# Patient Record
Sex: Male | Born: 1990 | Race: Black or African American | Hispanic: No | Marital: Single | Smoking: Current every day smoker
Health system: Southern US, Community
[De-identification: ages and names within clinical notes are randomized; demographics above are authoritative.]

## PROBLEM LIST (undated history)

## (undated) DIAGNOSIS — F329 Major depressive disorder, single episode, unspecified: Secondary | ICD-10-CM

## (undated) DIAGNOSIS — G479 Sleep disorder, unspecified: Secondary | ICD-10-CM

## (undated) DIAGNOSIS — S82899A Other fracture of unspecified lower leg, initial encounter for closed fracture: Secondary | ICD-10-CM

## (undated) DIAGNOSIS — F909 Attention-deficit hyperactivity disorder, unspecified type: Secondary | ICD-10-CM

## (undated) DIAGNOSIS — F32A Depression, unspecified: Secondary | ICD-10-CM

## (undated) DIAGNOSIS — F419 Anxiety disorder, unspecified: Secondary | ICD-10-CM

## (undated) DIAGNOSIS — F319 Bipolar disorder, unspecified: Secondary | ICD-10-CM

## (undated) HISTORY — DX: Sleep disorder, unspecified: G47.9

## (undated) HISTORY — PX: NO PAST SURGERIES: SHX2092

## (undated) HISTORY — DX: Attention-deficit hyperactivity disorder, unspecified type: F90.9

---
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
## 2010-01-17 ENCOUNTER — Emergency Department (HOSPITAL_COMMUNITY): Admission: EM | Admit: 2010-01-17 | Discharge: 2010-01-17 | Payer: Self-pay | Admitting: Emergency Medicine

## 2011-01-30 LAB — RAPID URINE DRUG SCREEN, HOSP PERFORMED
Amphetamines: NOT DETECTED
Barbiturates: NOT DETECTED
Benzodiazepines: POSITIVE — AB
Tetrahydrocannabinol: POSITIVE — AB

## 2011-01-30 LAB — BASIC METABOLIC PANEL
BUN: 12 mg/dL (ref 6–23)
Chloride: 106 mEq/L (ref 96–112)
Glucose, Bld: 94 mg/dL (ref 70–99)
Potassium: 4.1 mEq/L (ref 3.5–5.1)
Sodium: 139 mEq/L (ref 135–145)

## 2011-01-30 LAB — DIFFERENTIAL
Eosinophils Absolute: 0 10*3/uL (ref 0.0–0.7)
Eosinophils Relative: 0 % (ref 0–5)
Lymphs Abs: 1.1 10*3/uL (ref 0.7–4.0)
Monocytes Absolute: 0.6 10*3/uL (ref 0.1–1.0)

## 2011-01-30 LAB — CBC
HCT: 45 % (ref 39.0–52.0)
Hemoglobin: 14.2 g/dL (ref 13.0–17.0)
MCV: 87.2 fL (ref 78.0–100.0)
Platelets: 256 10*3/uL (ref 150–400)
WBC: 9.1 10*3/uL (ref 4.0–10.5)

## 2011-01-30 LAB — ETHANOL: Alcohol, Ethyl (B): <5 mg/dL (ref 0–10)

## 2011-01-30 LAB — TRICYCLICS SCREEN, URINE: TCA Scrn: NOT DETECTED

## 2011-12-03 ENCOUNTER — Emergency Department (INDEPENDENT_AMBULATORY_CARE_PROVIDER_SITE_OTHER)
Admission: EM | Admit: 2011-12-03 | Discharge: 2011-12-03 | Disposition: A | Discharge status: Home / Self Care | Payer: PPO | Source: Home / Self Care

## 2011-12-03 ENCOUNTER — Encounter (HOSPITAL_COMMUNITY): Payer: Self-pay | Admitting: *Deleted

## 2011-12-03 DIAGNOSIS — K047 Periapical abscess without sinus: Secondary | ICD-10-CM

## 2011-12-03 MED ORDER — HYDROCODONE-ACETAMINOPHEN 5-325 MG PO TABS: 1.0000 | ORAL_TABLET | Freq: Four times a day (QID) | ORAL | Status: AC | PRN

## 2011-12-03 MED ORDER — CLINDAMYCIN HCL 300 MG PO CAPS: 300.0000 mg | ORAL_CAPSULE | Freq: Four times a day (QID) | ORAL | Status: AC

## 2011-12-03 NOTE — ED Notes (Signed)
Co toothache to right lower molar x 1 day, rates pain at an 8, has no dentist at this time

## 2011-12-03 NOTE — ED Provider Notes (Signed)
History     CSN: [id number]  Arrival date & time 12/03/11  2440   None     Chief Complaint  Patient presents with  . Dental Pain    (Consider location/radiation/quality/duration/timing/severity/associated sxs/prior treatment) HPI Comments: Pt states has had "trouble with my tooth for a long time" but pain and swelling in gum started yesterday.   Patient is a 21 y.o. male presenting with tooth pain. The history is provided by the patient.  Dental PainPrimary symptoms do not include fever. Primary symptoms comment: abscess The symptoms began yesterday. The symptoms are worsening. The symptoms are new. The symptoms occur constantly.  Additional symptoms include: gum swelling and gum tenderness. Additional symptoms do not include: facial swelling.    Past Medical History  Diagnosis Date  . Schizophrenia     No past surgical history on file.  History reviewed. No pertinent family history.  History  Substance Use Topics  . Smoking status: Never Smoker   . Smokeless tobacco: Not on file  . Alcohol Use: No      Review of Systems  Constitutional: Negative for fever and chills.  HENT: Positive for dental problem. Negative for congestion, facial swelling and mouth sores.        Gum swelling and pain, dental pain  Hematological: Negative for adenopathy.    Allergies  Penicillins  Home Medications   Current Outpatient Rx  Name Route Sig Dispense Refill  . ARIPIPRAZOLE 20 MG PO TABS Oral Take 20 mg by mouth daily.    . ATOMOXETINE HCL 80 MG PO CAPS Oral Take 80 mg by mouth daily.    Marland Kitchen FLUOXETINE HCL 40 MG PO CAPS Oral Take 40 mg by mouth daily.      BP 132/83  Pulse 75  Temp(Src) 99.6 F (37.6 C) (Oral)  Resp 16  SpO2 98%  Physical Exam  Constitutional: He appears well-developed and well-nourished.  HENT:  Mouth/Throat: Dental abscesses present.    Pulmonary/Chest: Effort normal.  Lymphadenopathy:       Head (right side): No submandibular and no tonsillar  adenopathy present.       Head (left side): No submandibular and no tonsillar adenopathy present.    ED Course  Procedures (including critical care time)  Labs Reviewed - No data to display No results found.   No diagnosis found.    MDM  Discussed with Dr. Lorenz Coaster.        Cathlyn Parsons, NP 12/03/11 [phone]

## 2011-12-03 NOTE — ED Provider Notes (Signed)
Medical screening examination/treatment/procedure(s) were performed by non-physician practitioner and as supervising physician I was immediately available for consultation/collaboration.  Hillery Hunter, MD 12/03/11 [phone]

## 2012-02-23 ENCOUNTER — Encounter (HOSPITAL_COMMUNITY): Payer: Self-pay

## 2012-02-23 ENCOUNTER — Emergency Department (INDEPENDENT_AMBULATORY_CARE_PROVIDER_SITE_OTHER)
Admission: EM | Admit: 2012-02-23 | Discharge: 2012-02-23 | Disposition: A | Discharge status: Home Health | Payer: Self-pay | Source: Home / Self Care

## 2012-02-23 DIAGNOSIS — M542 Cervicalgia: Secondary | ICD-10-CM

## 2012-02-23 DIAGNOSIS — M25512 Pain in left shoulder: Secondary | ICD-10-CM

## 2012-02-23 DIAGNOSIS — M25519 Pain in unspecified shoulder: Secondary | ICD-10-CM

## 2012-02-23 HISTORY — DX: Depression, unspecified: F32.A

## 2012-02-23 HISTORY — DX: Major depressive disorder, single episode, unspecified: F32.9

## 2012-02-23 MED ORDER — IBUPROFEN 800 MG PO TABS: 800.0000 mg | ORAL_TABLET | Freq: Three times a day (TID) | ORAL | Status: AC

## 2012-02-23 MED ORDER — METHOCARBAMOL 750 MG PO TABS: 750.0000 mg | ORAL_TABLET | Freq: Four times a day (QID) | ORAL | Status: AC
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
## 2012-02-25 NOTE — ED Provider Notes (Signed)
Medical screening examination/treatment/procedure(s) were performed by non-physician practitioner and as supervising physician I was immediately available for consultation/collaboration.   MORENO-COLL,Lorelee Mclaurin; MD   Tomara Youngberg Moreno-Coll, MD 02/25/12 0855 

## 2012-08-06 ENCOUNTER — Encounter (HOSPITAL_COMMUNITY): Payer: Self-pay | Admitting: Emergency Medicine

## 2012-08-06 ENCOUNTER — Emergency Department (HOSPITAL_COMMUNITY)
Admission: EM | Admit: 2012-08-06 | Discharge: 2012-08-07 | Disposition: A | Discharge status: Other Acute Inpatient Hospital | Payer: BC Managed Care – PPO | Attending: Emergency Medicine | Admitting: Emergency Medicine

## 2012-08-06 DIAGNOSIS — F209 Schizophrenia, unspecified: Secondary | ICD-10-CM | POA: Insufficient documentation

## 2012-08-06 DIAGNOSIS — Z046 Encounter for general psychiatric examination, requested by authority: Secondary | ICD-10-CM | POA: Insufficient documentation

## 2012-08-06 LAB — CBC
HCT: 42.6 % (ref 39.0–52.0)
Hemoglobin: 14.1 g/dL (ref 13.0–17.0)
MCH: 27.7 pg (ref 26.0–34.0)
MCHC: 33.1 g/dL (ref 30.0–36.0)
MCV: 83.7 fL (ref 78.0–100.0)
Platelets: 245 10*3/uL (ref 150–400)
RBC: 5.09 MIL/uL (ref 4.22–5.81)
RDW: 14.4 % (ref 11.5–15.5)
WBC: 9.2 10*3/uL (ref 4.0–10.5)

## 2012-08-06 LAB — COMPREHENSIVE METABOLIC PANEL
ALT: 75 U/L — ABNORMAL HIGH (ref 0–53)
AST: 35 U/L (ref 0–37)
Albumin: 4.3 g/dL (ref 3.5–5.2)
CO2: 25 mEq/L (ref 19–32)
Chloride: 99 mEq/L (ref 96–112)
GFR calc non Af Amer: >90 mL/min (ref >90)
Potassium: 3.9 mEq/L (ref 3.5–5.1)
Sodium: 134 mEq/L — ABNORMAL LOW (ref 135–145)
Total Bilirubin: 0.3 mg/dL (ref 0.3–1.2)

## 2012-08-06 LAB — RAPID URINE DRUG SCREEN, HOSP PERFORMED
Amphetamines: NOT DETECTED
Barbiturates: NOT DETECTED
Tetrahydrocannabinol: NOT DETECTED

## 2012-08-06 MED ORDER — ARIPIPRAZOLE 10 MG PO TABS: 20.0000 mg | ORAL_TABLET | Freq: Every day | ORAL | Status: DC

## 2012-08-06 MED ORDER — METFORMIN HCL 500 MG PO TABS: 500.0000 mg | ORAL_TABLET | Freq: Every day | ORAL | Status: DC

## 2012-08-06 MED ORDER — ATOMOXETINE HCL 40 MG PO CAPS: 80.0000 mg | ORAL_CAPSULE | Freq: Every day | ORAL | Status: DC

## 2012-08-06 MED ORDER — ARIPIPRAZOLE 5 MG PO TABS: 5.0000 mg | ORAL_TABLET | Freq: Every day | ORAL | Status: DC

## 2012-08-06 MED ORDER — FLUOXETINE HCL 20 MG PO CAPS: 40.0000 mg | ORAL_CAPSULE | Freq: Every day | ORAL | Status: DC

## 2012-08-06 NOTE — ED Notes (Signed)
Tele-psych paperwork has been faxed-awaiting psychiatrist

## 2012-08-06 NOTE — ED Notes (Signed)
George [name] from [facility] in to do pt's assessment.

## 2012-08-06 NOTE — ED Notes (Signed)
1 pair of boxer shorts placed in [place] [address]-parents took all other clothing home.  Tele-psych in progress.

## 2012-08-06 NOTE — ED Notes (Signed)
Has been in depression for a couple weeks, [facility]  [facility], then two weeks ago did not take meds for three days, started having suicidal Ideations at that time-- has a plan, would take mother's pain medicine and overdose. Was dx 2 yrs ago as schizophrenic, IVC'd at that time. No hospitalizations since-- treated as outpatient first at [facility] then [facility].

## 2012-08-06 NOTE — ED Notes (Signed)
Pt states he has a history of schizophrenia.  Pt states he has been non-compliant with his meds over the last 2-3 weeks.  Pt states he lives with his parents and goes to school at [employer]-recent transfer from [employer].  Pt states he has been having suicidal ideation with plan to overdose on his Mother's narcotic medications or "find a friend who has a gun".  Pt states he has been hospitalized in the past for suicidal ideation.  Pt is alert and cooperative-given sandwich and drink with cheesestick and applesauce.  Oriented to environment.

## 2012-08-06 NOTE — ED Notes (Signed)
Telepsych completed.  

## 2012-08-06 NOTE — ED Provider Notes (Signed)
History     CSN: [id number]  Arrival date & time 08/06/12  7829   First MD Initiated Contact with Patient 08/06/12 1936      Chief Complaint  Patient presents with  . Medical Clearance    (Consider location/radiation/quality/duration/timing/severity/associated sxs/prior treatment) The history is provided by the patient.  pt w hx schizophrenia and depression, states has been feeling more depressed in past week. Denies any exacerbating or alleviating factors or specific inciting event.  Denies any recent new meds or change in meds, except to say recently non compliant w meds. Denies any attempt at self harm, but states has thoughts of suicide. No thoughts of harming others. Denies hallucinations. States physical health at baseline. Normal appetite. Some trouble sleeping at night.   Past Medical History  Diagnosis Date  . Schizophrenia   . Depression     History reviewed. No pertinent past surgical history.  History reviewed. No pertinent family history.  History  Substance Use Topics  . Smoking status: Never Smoker   . Smokeless tobacco: Not on file  . Alcohol Use: No      Review of Systems  Constitutional: Negative for fever.  HENT: Negative for neck pain.   Eyes: Negative for redness.  Respiratory: Negative for shortness of breath.   Cardiovascular: Negative for chest pain.  Gastrointestinal: Negative for abdominal pain.  Genitourinary: Negative for flank pain.  Musculoskeletal: Negative for back pain.  Skin: Negative for rash.  Neurological: Negative for headaches.  Hematological: Does not bruise/bleed easily.  Psychiatric/Behavioral: Positive for dysphoric mood.    Allergies  Penicillins  Home Medications   Current Outpatient Rx  Name Route Sig Dispense Refill  . ARIPIPRAZOLE 20 MG PO TABS Oral Take 20 mg by mouth daily.    . ATOMOXETINE HCL 80 MG PO CAPS Oral Take 80 mg by mouth daily.    Marland Kitchen FLUOXETINE HCL 40 MG PO CAPS Oral Take 40 mg by mouth daily.      Marland Kitchen METFORMIN HCL 500 MG PO TABS Oral Take 500 mg by mouth.      BP 141/86  Temp 98.8 F (37.1 C) (Oral)  Resp 20  Ht 5\' 11"  (1.803 m)  Wt 300 lb (136.079 kg)  BMI 41.84 kg/m2  SpO2 97%  Physical Exam  Nursing note and vitals reviewed. Constitutional: He is oriented to person, place, and time. He appears well-developed and well-nourished. No distress.  HENT:  Head: Atraumatic.  Eyes: Pupils are equal, round, and reactive to light.  Neck: Neck supple. No tracheal deviation present. No thyromegaly present.  Cardiovascular: Normal rate, regular rhythm, normal heart sounds and intact distal pulses.   Pulmonary/Chest: Effort normal and breath sounds normal. No accessory muscle usage. No respiratory distress.  Abdominal: Soft. He exhibits no distension. There is no tenderness.  Musculoskeletal: Normal range of motion.  Neurological: He is alert and oriented to person, place, and time.       Steady gait  Skin: Skin is warm and dry.  Psychiatric:       Depressed mood.     ED Course  Procedures (including critical care time)  Labs Reviewed  COMPREHENSIVE METABOLIC PANEL - Abnormal; Notable for the following:    Sodium 134 (*)     ALT 75 (*)     All other components within normal limits  CBC  ETHANOL  URINE RAPID DRUG SCREEN (HOSP PERFORMED)    Results for orders placed during the hospital encounter of 08/06/12  CBC  Component Value Range   WBC 9.2  4.0 - 10.5 K/uL   RBC 5.09  4.22 - 5.81 MIL/uL   Hemoglobin 14.1  13.0 - 17.0 g/dL   HCT 82.9  56.2 - 13.0 %   MCV 83.7  78.0 - 100.0 fL   MCH 27.7  26.0 - 34.0 pg   MCHC 33.1  30.0 - 36.0 g/dL   RDW 86.5  78.4 - 69.6 %   Platelets 245  150 - 400 K/uL  COMPREHENSIVE METABOLIC PANEL      Component Value Range   Sodium 134 (*) 135 - 145 mEq/L   Potassium 3.9  3.5 - 5.1 mEq/L   Chloride 99  96 - 112 mEq/L   CO2 25  19 - 32 mEq/L   Glucose, Bld 95  70 - 99 mg/dL   BUN 12  6 - 23 mg/dL   Creatinine, Ser 2.95  0.50 -  1.35 mg/dL   Calcium 9.6  8.4 - 28.4 mg/dL   Total Protein 8.0  6.0 - 8.3 g/dL   Albumin 4.3  3.5 - 5.2 g/dL   AST 35  0 - 37 U/L   ALT 75 (*) 0 - 53 U/L   Alkaline Phosphatase 80  39 - 117 U/L   Total Bilirubin 0.3  0.3 - 1.2 mg/dL   GFR calc non Af Amer >90  >90 mL/min   GFR calc Af Amer >90  >90 mL/min  ETHANOL      Component Value Range   Alcohol, Ethyl (B) <11  0 - 11 mg/dL  URINE RAPID DRUG SCREEN (HOSP PERFORMED)      Component Value Range   Opiates NONE DETECTED  NONE DETECTED   Cocaine NONE DETECTED  NONE DETECTED   Benzodiazepines NONE DETECTED  NONE DETECTED   Amphetamines NONE DETECTED  NONE DETECTED   Tetrahydrocannabinol NONE DETECTED  NONE DETECTED   Barbiturates NONE DETECTED  NONE DETECTED       MDM  Labs.  [facility] team called.   telepsych consult.   Signed out to oncoming edp to f/u with telepsych and act recommendations/placement.        Suzi Roots, MD 08/06/12 2202

## 2012-08-07 MED ORDER — FLUOXETINE HCL 20 MG PO CAPS
40.0000 mg | ORAL_CAPSULE | Freq: Every day | ORAL | Status: DC
  Administered 2012-08-07: 40 mg via ORAL
  Filled 2012-08-07 (×2): qty 2

## 2012-08-07 MED ORDER — ARIPIPRAZOLE 5 MG PO TABS
5.0000 mg | ORAL_TABLET | Freq: Every day | ORAL | Status: DC
  Administered 2012-08-07: 5 mg via ORAL
  Filled 2012-08-07 (×2): qty 1

## 2012-08-07 NOTE — ED Notes (Signed)
Father present to see this pt

## 2012-08-07 NOTE — BH Assessment (Signed)
Assessment Note   George [name] is a21 y.o. male who presents with SI w/plan to overdose on mom's medication.  Pt reports non-compliance with meds---"I don't like taking my meds b/c I don't like how they make me feel and sometimes I just forget". Pt reports no specific precip event to trigger SI and plan but teel this writer he had his 1st psychotic break approx 2 yrs ago while a student at [employer] and was admitted to [facility] and has had intermittent SI thoughts since that time.  Pt says when he had the psychotic break he had grandiose delusions that "I was better than everybody else".  Pt says he has been of his meds for 3 days; prescribed abilify, prozac and straterra.  Pt describes his depression: over sleeping and eating--"there's no end to what's going on".  Pt is anxious and restless during interview but cooperative, able to answer questions and remain focus.  Pt has completed telepsych recommend inpt admission.  Axis I: Chronic Paranoid Schizophrenia Axis II: Deferred Axis III:  Past Medical History  Diagnosis Date  . Schizophrenia   . Depression    Axis IV: other psychosocial or environmental problems and problems related to social environment Axis V: 31-40 impairment in reality testing  Past Medical History:  Past Medical History  Diagnosis Date  . Schizophrenia   . Depression     History reviewed. No pertinent past surgical history.  Family History: History reviewed. No pertinent family history.  Social History:  reports that he has never smoked. He does not have any smokeless tobacco history on file. He reports that he does not drink alcohol or use illicit drugs.  Additional Social History:  Alcohol / Drug Use Pain Medications: None  Prescriptions: None  Over the Counter: eNone  History of alcohol / drug use?: No history of alcohol / drug abuse Longest period of sobriety (when/how long): None   CIWA: CIWA-Ar BP: 134/84 mmHg Pulse Rate: 75    COWS:    Allergies:  Allergies  Allergen Reactions  . Penicillins Rash    Home Medications:  (Not in a hospital admission)  OB/GYN Status:  No LMP for male patient.  General Assessment Data Location of Assessment: [facility] Living Arrangements: Parent (Lives with parents ) Can pt return to current living arrangement?: Yes Admission Status: Voluntary Is patient capable of signing voluntary admission?: Yes Transfer from: Acute Hospital Referral Source: MD  Education Status Is patient currently in school?: No Current Grade: None  Highest grade of school patient has completed: None  Name of school: None  Contact person: None   Risk to self Suicidal Ideation: Yes-Currently Present Suicidal Intent: Yes-Currently Present Is patient at risk for suicide?: Yes Suicidal Plan?: Yes-Currently Present Specify Current Suicidal Plan: Overdose on mother's meds(diazepam)  Access to Means: Yes Specify Access to Suicidal Means: Mom's medications  What has been your use of drugs/alcohol within the last 12 months?: Past hx of Cannabis Abuse  Previous Attempts/Gestures: No How many times?: 0  Other Self Harm Risks: None  Triggers for Past Attempts: None known Intentional Self Injurious Behavior: None Family Suicide History: No Recent stressful life event(s): Other (Comment) (Non compliant with meds; Chronic Mental Illness ) Persecutory voices/beliefs?: No Depression: Yes Depression Symptoms: Loss of interest in usual pleasures;Feeling worthless/self pity Substance abuse history and/or treatment for substance abuse?: No Suicide prevention information given to non-admitted patients: Not applicable  Risk to Others Homicidal Ideation: No Thoughts of Harm to Others:  No Current Homicidal Intent: No Current Homicidal Plan: No Access to Homicidal Means: No Identified Victim: None  History of harm to others?: No Assessment of Violence: None Noted Violent Behavior Description: None  Does  patient have access to weapons?: No Criminal Charges Pending?: No Does patient have a court date: No  Psychosis Hallucinations: None noted Delusions: None noted  Mental Status Report Appear/Hygiene: Other (Comment) (Appropriate ) Eye Contact: Good Motor Activity: Unremarkable Speech: Logical/coherent Level of Consciousness: Quiet/awake Mood: Depressed;Anxious;Helpless Affect: Anxious;Depressed Anxiety Level: Minimal Thought Processes: Coherent;Relevant Judgement: Impaired Orientation: Person;Place;Time;Situation Obsessive Compulsive Thoughts/Behaviors: None  Cognitive Functioning Concentration: Decreased Memory: Recent Intact;Remote Intact IQ: Average Insight: Poor Impulse Control: Poor Appetite: Good Weight Loss: 0  Weight Gain: 0  Sleep: Increased Total Hours of Sleep: 8  Vegetative Symptoms: Staying in bed  ADLScreening [facility] Assessment Services) Patient's cognitive ability adequate to safely complete daily activities?: Yes Patient able to express need for assistance with ADLs?: Yes Independently performs ADLs?: Yes (appropriate for developmental age)  Abuse/Neglect [facility]) Physical Abuse: Denies Verbal Abuse: Denies Sexual Abuse: Denies  Prior Inpatient Therapy Prior Inpatient Therapy: Yes Prior Therapy Dates: 2011 Prior Therapy Facilty/Provider(s): [facility]  Reason for Treatment: Psychosis   Prior Outpatient Therapy Prior Outpatient Therapy: Yes Prior Therapy Dates: Current  Prior Therapy Facilty/Provider(s): Dr. Farrel Demark; [facility]  Reason for Treatment: Med Mgt; Therapy   ADL Screening (condition at time of admission) Patient's cognitive ability adequate to safely complete daily activities?: Yes Patient able to express need for assistance with ADLs?: Yes Independently performs ADLs?: Yes (appropriate for developmental age) Weakness of Legs: None Weakness of Arms/Hands: None  Home Assistive Devices/Equipment Home Assistive  Devices/Equipment: None  Therapy Consults (therapy consults require a physician order) PT Evaluation Needed: No OT Evalulation Needed: No SLP Evaluation Needed: No Abuse/Neglect Assessment (Assessment to be complete while patient is alone) Physical Abuse: Denies Verbal Abuse: Denies Sexual Abuse: Denies Exploitation of patient/patient's resources: Denies Self-Neglect: Denies Values / Beliefs Cultural Requests During Hospitalization: None Spiritual Requests During Hospitalization: None Consults Spiritual Care Consult Needed: No Social Work Consult Needed: No Merchant navy officer (For Healthcare) Advance Directive: Patient does not have advance directive;Patient would not like information Pre-existing out of facility DNR order (yellow form or pink MOST form): No Nutrition Screen- [facility] Patient's home diet: Carb modified Have you recently lost weight without trying?: No Have you been eating poorly because of a decreased appetite?: No Malnutrition Screening Tool Score: 0   Additional Information 1:1 In Past 12 Months?: No CIRT Risk: No Elopement Risk: No Does patient have medical clearance?: Yes     Disposition:  Disposition Disposition of Patient: Inpatient treatment program;Referred to [facility] ) Type of inpatient treatment program: Adult Patient referred to: Other (Comment) [facility] )  On Site Evaluation by:   Reviewed with Physician:     Murrell Redden 08/07/2012 12:28 AM

## 2012-08-07 NOTE — BHH Counselor (Signed)
Patient accepted at [facility] pending a bed assignment. No available beds at [facility] this am. Patient was then referred to [facility] this morning for placement. Later received a call from [facility] stating that their physician (Dr. Hardie Pulley) accepted patient into their facility for treatment. The call report number is [phone] and patient is to report to their Media Building. Writer provided the above information to the EDP here at [facility] (Dr. Freida Busman) and he agreed to discharge patient for treatment at [facility]. Writer also provided patient's nurse with the above information and she will follow up with calling report and transportation to [facility].  [facility] contacted and updated with the above information also.

## 2012-08-07 NOTE — ED Provider Notes (Signed)
Pt accepted at [facility], awaiting transfer  Toy Baker, MD 08/07/12 [phone]

## 2012-08-07 NOTE — [facility] Counselor (Signed)
George [name], assessment counselor at [facility], submitted Pt for admission to [facility]. Marquita Palms McMurren, AC confirmed the adult unit is currently at capacity. Gave clinical reports to Dr. Orson Aloe who accepted Pt to the service of Dr. Cyndia Diver when a 400-hall bed is available. Notified George [name] of disposition.  Harlin Rain Patsy Baltimore, LPC

## 2014-04-30 ENCOUNTER — Ambulatory Visit (INDEPENDENT_AMBULATORY_CARE_PROVIDER_SITE_OTHER): Payer: BC Managed Care – PPO | Admitting: Neurology

## 2014-04-30 ENCOUNTER — Encounter: Payer: Self-pay | Admitting: Neurology

## 2014-04-30 VITALS — BP 116/74 | HR 64 | Temp 98.4°F | Ht 71.0 in | Wt 325.0 lb

## 2014-04-30 DIAGNOSIS — F200 Paranoid schizophrenia: Secondary | ICD-10-CM

## 2014-04-30 DIAGNOSIS — G4733 Obstructive sleep apnea (adult) (pediatric): Secondary | ICD-10-CM

## 2014-04-30 DIAGNOSIS — E669 Obesity, unspecified: Secondary | ICD-10-CM

## 2014-04-30 DIAGNOSIS — G479 Sleep disorder, unspecified: Secondary | ICD-10-CM

## 2014-04-30 HISTORY — DX: Sleep disorder, unspecified: G47.9

## 2014-04-30 NOTE — Progress Notes (Signed)
Subjective:    Patient ID: George [name] is a 23 y.o. male.  HPI    George Foley, MD, PhD [facility] [address] [address] [place], [place] [address]  Dear George [name],   I saw your patient, George [name], upon your kind request in my neurologic clinic today for initial consultation of his sleep disorder, in particular, concern for underlying sleep apnea. The patient is accompanied by his mother today. As you know, George [name] is a 23 year old right-handed gentleman with an underlying medical history of obesity, schizophrenia, who reports snoring and daytime somnolence.  He has witnessed apneas. He does not wake up with headaches. He does not wake up rested. He drinks 2 cups of coffee and some tea and no sodas (rare). He is on Abilify 30 mg qHS, Wellbutrin 150 mg, Zoloft 200 mg, cogentin 1 mg bid. He has no recent VH/AH/SI/HI, his last behavioral admission was 2013 at [facility].  He is in college.  His typical bedtime is reported to be around 9 PM and usual wake time is around 7 AM. Sleep onset typically occurs within minutes. He reports feeling marginally rested upon awakening. He wakes up on an average 1 times in the middle of the night and has to go to the bathroom 0 to 1 time on a typical night. He denies morning headaches.  He reports excessive daytime somnolence (EDS) and His Epworth Sleepiness Score (ESS) is 14/24 today. He has not fallen asleep while driving. The patient has been taking a planned nap, which is usually 2 hours long. He has come close to dozing off while at the wheel.  He has been known to snore for the past many years. Snoring is reportedly severe, and associated with choking sounds and witnessed apneas. The patient denies a sense of choking or strangling feeling. There is report of nighttime reflux, with no nighttime cough experienced. The patient has not noted any RLS symptoms and is not known to kick while asleep or before falling  asleep. He denies cataplexy, sleep paralysis, hypnagogic or hypnopompic hallucinations, or sleep attacks. He does not report any vivid dreams, nightmares, dream enactments, or parasomnias, such as sleep talking or sleep walking. The patient has not had a sleep study or a home sleep test.   His bedroom is usually dark and cool. There is no TV in the bedroom and usually it is not on at night.   His Past Medical History Is Significant For: Past Medical History  Diagnosis Date  . Schizophrenia   . Depression   . ADHD (attention deficit hyperactivity disorder)     His Past Surgical History Is Significant For: No past surgical history on file.  His Family History Is Significant For: Family History  Problem Relation Age of Onset  . Diabetes Maternal Grandmother   . Alzheimer's disease Paternal Grandmother   . Heart failure Paternal Grandmother   . Heart failure Paternal Grandfather   . Heart failure Maternal Grandfather     His Social History Is Significant For: History   Social History  . Marital Status: Single    Spouse Name: N/A    Number of Children: N/A  . Years of Education: N/A   Social History Main Topics  . Smoking status: Never Smoker   . Smokeless tobacco: None  . Alcohol Use: No  . Drug Use: No  . Sexual Activity:    Other Topics Concern  . None   Social History Narrative  . None  His Allergies Are:  Allergies  Allergen Reactions  . Penicillins Rash  :   His Current Medications Are:  Outpatient Encounter Prescriptions as of 04/30/2014  Medication Sig  . ARIPiprazole (ABILIFY) 20 MG tablet Take 30 mg by mouth daily.   . benztropine (COGENTIN) 1 MG tablet Take 1 mg by mouth 2 (two) times daily.  Marland Kitchen buPROPion (WELLBUTRIN XL) 150 MG 24 hr tablet Take 150 mg by mouth daily.  . sertraline (ZOLOFT) 100 MG tablet Take 200 mg by mouth daily.  . [DISCONTINUED] atomoxetine (STRATTERA) 80 MG capsule Take 80 mg by mouth daily.  . [DISCONTINUED] FLUoxetine (PROZAC)  40 MG capsule Take 40 mg by mouth daily.  . [DISCONTINUED] metFORMIN (GLUCOPHAGE) 500 MG tablet Take 500 mg by mouth.  :  Review of Systems:  Out of a complete 14 point review of systems, all are reviewed and negative with the exception of these symptoms as listed below:   Review of Systems  Constitutional: Positive for activity change and fatigue.  HENT: Negative.   Eyes: Negative.   Respiratory: Negative.   Cardiovascular: Negative.   Gastrointestinal: Negative.   Endocrine: Negative.   Genitourinary: Negative.   Musculoskeletal: Negative.   Skin: Positive for rash.  Allergic/Immunologic: Positive for environmental allergies.  Neurological: Negative.   Hematological: Negative.   Psychiatric/Behavioral: Positive for sleep disturbance (e.d.s., snoring) and dysphoric mood. The patient is nervous/anxious.     Objective:  Neurologic Exam  Physical Exam Physical Examination:   Filed Vitals:   04/30/14 0854  BP: 116/74  Pulse: 64  Temp: 98.4 F (36.9 C)    General Examination: The patient is a very pleasant 23 y.o. male in no acute distress. He appears well-developed and well-nourished and well groomed.   HEENT: Normocephalic, atraumatic, pupils are equal, round and reactive to light and accommodation. Funduscopic exam is normal with sharp disc margins noted. Extraocular tracking is good without limitation to gaze excursion or nystagmus noted. Normal smooth pursuit is noted. Hearing is grossly intact. Tympanic membranes are clear bilaterally. Face is symmetric with normal facial animation and normal facial sensation. Speech is clear with no dysarthria noted. There is no hypophonia. There is no lip, neck/head, jaw or voice tremor. Neck is supple with full range of passive and active motion. There are no carotid bruits on auscultation. Oropharynx exam reveals: mild mouth dryness, adequate dental hygiene and moderate airway crowding, due to longer tongue, longer uvula and tonsils in  place. Mallampati is class II. Tongue protrudes centrally and palate elevates symmetrically. Tonsils are 1+ in size/absent. Neck size is 19.75 inches. He has a Absent overbite. Nasal inspection reveals no significant nasal mucosal bogginess or redness and no septal deviation.   Chest: Clear to auscultation without wheezing, rhonchi or crackles noted.  Heart: S1+S2+0, regular and normal without murmurs, rubs or gallops noted.   Abdomen: Soft, non-tender and non-distended with normal bowel sounds appreciated on auscultation.  Extremities: There is no pitting edema in the distal lower extremities bilaterally. Pedal pulses are intact.  Skin: Warm and dry without trophic changes noted. There are no varicose veins.  Musculoskeletal: exam reveals no obvious joint deformities, tenderness or joint swelling or erythema.   Neurologically:  Mental status: The patient is awake, alert and oriented in all 4 spheres. His immediate and remote memory, attention, language skills and fund of knowledge are appropriate. There is no evidence of aphasia, agnosia, apraxia or anomia. Speech is clear with normal prosody and enunciation. Thought process is linear. Mood is  normal and affect is normal.  Cranial nerves II - XII are as described above under HEENT exam. In addition: shoulder shrug is normal with equal shoulder height noted. Motor exam: Normal bulk, strength and tone is noted. There is no drift, tremor or rebound. Romberg is negative. Reflexes are 2+ throughout. Babinski: Toes are flexor bilaterally. Fine motor skills and coordination: intact with normal finger taps, normal hand movements, normal rapid alternating patting, normal foot taps and normal foot agility.  Cerebellar testing: No dysmetria or intention tremor on finger to nose testing. Heel to shin is unremarkable bilaterally. There is no truncal or gait ataxia.  Sensory exam: intact to light touch, pinprick, vibration, temperature sense in the upper and  lower extremities.  Gait, station and balance: He stands easily. No veering to one side is noted. No leaning to one side is noted. Posture is age-appropriate and stance is narrow based. Gait shows normal stride length and normal pace. No problems turning are noted. He turns en bloc. Tandem walk is unremarkable. Intact toe and heel stance is noted.               Assessment and Plan:   In summary, Hulda MarinWilliam A Sipe is a very pleasant 23 y.o.-year old male with an underlying medical history of obesity, schizophrenia, who reports snoring, witnessed apneas and daytime somnolence, with a history and physical exam concerning for obstructive sleep apnea (OSA). I had a long chat with the patient and his mother about my findings and the diagnosis of OSA, its prognosis and treatment options. We talked about medical treatments, surgical interventions and non-pharmacological approaches. I explained in particular the risks and ramifications of untreated moderate to severe OSA, especially with respect to developing cardiovascular disease down the Road, including congestive heart failure, difficult to treat hypertension, cardiac arrhythmias, or stroke. Even type 2 diabetes has, in part, been linked to untreated OSA. Symptoms of untreated OSA include daytime sleepiness, memory problems, mood irritability and mood disorder such as depression and anxiety, lack of energy, as well as recurrent headaches, especially morning headaches. We talked about trying to maintain a healthy lifestyle in general, as well as the importance of weight control. He has been able to lose several pounds. I encouraged the patient to eat healthy, exercise daily and keep well hydrated, to keep a scheduled bedtime and wake time routine, to not skip any meals and eat healthy snacks in between meals. I advised the patient not to drive when feeling sleepy. He is advised to pull over and rest when he starts feeling drowsy while at the wheel. He demonstrated  understanding and voiced agreement. I recommended the following at this time: sleep study with potential positive airway pressure titration.  I explained the sleep test procedure to the patient and also outlined possible surgical and non-surgical treatment options of OSA, including the use of a custom-made dental device (which would require a referral to a specialist dentist or oral surgeon), upper airway surgical options, such as pillar implants, radiofrequency surgery, tongue base surgery, and UPPP (which would involve a referral to an ENT surgeon). Rarely, jaw surgery such as mandibular advancement may be considered.  I also explained the CPAP treatment option to the patient, who indicated that he would be willing to try CPAP if the need arises. I explained the importance of being compliant with PAP treatment, not only for insurance purposes but primarily to improve His symptoms, and for the patient's long term health benefit, including to reduce His cardiovascular risks.  I answered all their questions today and the patient and his mother were in agreement. I would like to see him back after the sleep study is completed and encouraged them to call with any interim questions, concerns, problems or updates.   Thank you very much for allowing me to participate in the care of this nice patient. If I can be of any further assistance to you please do not hesitate to call me at [phone].  Sincerely,   George FoleySaima Athar, MD, PhD

## 2014-04-30 NOTE — Patient Instructions (Signed)

## 2014-06-11 ENCOUNTER — Ambulatory Visit (INDEPENDENT_AMBULATORY_CARE_PROVIDER_SITE_OTHER): Payer: BC Managed Care – PPO | Admitting: Neurology

## 2014-06-11 ENCOUNTER — Encounter: Payer: Self-pay | Admitting: Neurology

## 2014-06-11 DIAGNOSIS — E669 Obesity, unspecified: Secondary | ICD-10-CM

## 2014-06-11 DIAGNOSIS — G4733 Obstructive sleep apnea (adult) (pediatric): Secondary | ICD-10-CM

## 2014-06-11 DIAGNOSIS — G4710 Hypersomnia, unspecified: Secondary | ICD-10-CM

## 2014-06-11 DIAGNOSIS — F200 Paranoid schizophrenia: Secondary | ICD-10-CM

## 2014-06-26 ENCOUNTER — Telehealth: Payer: Self-pay | Admitting: Neurology

## 2014-06-26 DIAGNOSIS — G4733 Obstructive sleep apnea (adult) (pediatric): Secondary | ICD-10-CM

## 2014-06-26 NOTE — Telephone Encounter (Signed)
Please call and notify patient that the recent sleep study confirmed the diagnosis of OSA. He did well with CPAP during the study with significant improvement of the respiratory events. Therefore, I would like start the patient on CPAP at home. I placed the order in the chart.   Arrange for CPAP set up at home through a DME company of patient's choice and fax/route report to PCP and referring MD (if other than PCP).   The patient will also need a follow up appointment with me in 6-8 weeks post set up that has to be scheduled; help the patient schedule this (in a follow-up slot).   Please re-enforce the importance of compliance with treatment and the need for us to monitor compliance data.   Once you have spoken to the patient and scheduled the return appointment, you may close this encounter, thanks,   Patra Gherardi, MD, PhD [facility] ([facility]    

## 2014-07-15 NOTE — Telephone Encounter (Signed)
Patient calls to the office to review his sleep study results.  He was advised that his sleep study revealed severe obstructive sleep apnea with treatment recommended in the form of CPAP therapy.  Pt says that currently his deductible is too high for him to proceed with treatment.  He says that he will call our office if he is able to get financial assistance from his parents.

## 2015-12-07 ENCOUNTER — Encounter (HOSPITAL_COMMUNITY): Payer: Self-pay | Admitting: *Deleted

## 2015-12-07 ENCOUNTER — Emergency Department (HOSPITAL_COMMUNITY)
Admission: EM | Admit: 2015-12-07 | Discharge: 2015-12-08 | Disposition: A | Discharge status: Other Acute Inpatient Hospital | Payer: Managed Care, Other (non HMO) | Attending: Emergency Medicine | Admitting: Emergency Medicine

## 2015-12-07 DIAGNOSIS — F419 Anxiety disorder, unspecified: Secondary | ICD-10-CM | POA: Insufficient documentation

## 2015-12-07 DIAGNOSIS — Z880 Allergy status to penicillin: Secondary | ICD-10-CM | POA: Diagnosis not present

## 2015-12-07 DIAGNOSIS — F22 Delusional disorders: Secondary | ICD-10-CM | POA: Insufficient documentation

## 2015-12-07 DIAGNOSIS — F259 Schizoaffective disorder, unspecified: Secondary | ICD-10-CM | POA: Insufficient documentation

## 2015-12-07 DIAGNOSIS — F909 Attention-deficit hyperactivity disorder, unspecified type: Secondary | ICD-10-CM | POA: Insufficient documentation

## 2015-12-07 DIAGNOSIS — Z008 Encounter for other general examination: Secondary | ICD-10-CM | POA: Diagnosis present

## 2015-12-07 DIAGNOSIS — F251 Schizoaffective disorder, depressive type: Secondary | ICD-10-CM | POA: Diagnosis present

## 2015-12-07 DIAGNOSIS — Z79899 Other long term (current) drug therapy: Secondary | ICD-10-CM | POA: Insufficient documentation

## 2015-12-07 DIAGNOSIS — F329 Major depressive disorder, single episode, unspecified: Secondary | ICD-10-CM | POA: Diagnosis not present

## 2015-12-07 MED ORDER — ZIPRASIDONE MESYLATE 20 MG IM SOLR
10.0000 mg | Freq: Once | INTRAMUSCULAR | Status: AC
  Administered 2015-12-07: 10 mg via INTRAMUSCULAR
  Filled 2015-12-07: qty 20

## 2015-12-07 MED ORDER — OLANZAPINE 10 MG PO TBDP
10.0000 mg | ORAL_TABLET | Freq: Every day | ORAL | Status: DC
  Administered 2015-12-08: 10 mg via ORAL
  Filled 2015-12-07: qty 1

## 2015-12-07 MED ORDER — LORAZEPAM 1 MG PO TABS
1.0000 mg | ORAL_TABLET | Freq: Once | ORAL | Status: DC
  Filled 2015-12-07: qty 1

## 2015-12-07 MED ORDER — LORAZEPAM 2 MG/ML IJ SOLN
2.0000 mg | Freq: Once | INTRAMUSCULAR | Status: AC
  Administered 2015-12-07: 2 mg via INTRAMUSCULAR
  Filled 2015-12-07: qty 1

## 2015-12-07 NOTE — ED Notes (Signed)
Pt was still not cooperating- was speaking incoherently and was refusing to change or take medication. Geodon and Ativan given with help from security and [employer].

## 2015-12-07 NOTE — ED Notes (Signed)
Delay on blood draw,  Pt not cooperating

## 2015-12-07 NOTE — ED Notes (Addendum)
Pt's parents reports pt's manager called them reporting pt have been acting "strange" and not himself.   They report that pt have stopped taking his meds for a year.  This nurse asked pt why his mgr called his parents today while he was at work.  Pt states that he does not know why, but started to recount what happened.  He said "I told amy that my father has cancer,"  "She really made me feel better" but he reports that "Gwen started to say .."  Pt would stop mid-sentence everytime he tried to tell this  Nurse what happened at work today.  Pt's mother reports that pt has been in the ED in the past for having a "psych break down."  She states that she does not feel safe with pt in their home.  Pt made aware that he will need to provide a urine sample and blood sample.  Pt questioned why he needs to provide both.  Explained to pt the reason for this.  Pt appears manic and on edge.  Would cut this nurse off when speaking to him.  Mom reports that she found a bottle of caffeine tabs in his room today.  Asked pt why is he taking caffeine pills, pt states " I did have coffee today" and would not answer why?

## 2015-12-07 NOTE — ED Notes (Signed)
UNABLE TO COLLECT LABS BECAUSE OF PATIENT BEHAVIOR 

## 2015-12-07 NOTE — BH Assessment (Addendum)
Tele Assessment Note   George [name] is an 25 y.o. African-American male who was brought voluntarily to the [facility] by his parents after they received a call from his employer today.  Per parents, pt's manager called to tell them that pt "was not himself" and "was acting strange."  Per pt, he is "confused" about the events at work today but stated that he told a friend, Amy, at work that his father has cancer and pt sts talking to her was comforting.  After that, pt sts others came to him asking questions and he ended up in his manager's office.  Per parents, pt has been previously diagnosed with schizophrenia, paranoid type. Per mom, pt has been prescribed medications for his mental health diagnoses but, has not taken them for a bout 1 year. Pt was being seen at [facility] for medication management and therapy. Per parents, pt has had "a psych breakdown" previously about 7 years ago and was hospitalized.  Pt was hospitalized again about 4 years ago due to altered mental status.  Pt denies SI, HI, SHI and AVH although pt paused about 5 seconds before answering each time and appeared to be listening or thinking in the pause. Pt has also been previously diagnosed with ADHD.  Pt sts his main stressor is conflict with a Production designer, theatre/television/film at work, [employer]. Pt remained focused on Gwen throughout the assessment.  When asked if he had been having or had ever had thoughts of harming her he stated no. Pt denied symptoms of depression or anxiety, although pt appeared anxious throughout. Pt denied abuse including physical, emotional.verbal and sexual although, with each question, pt paused for about 5 seconds before stating "not really."   Pt sts he lives with his parents and is taking courses through [employer].  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  Pt currently is employed at [employer], a used Product/process development scientist.  Pt denies regular substance use currently but sts that he at one time about  4 years ago smoked marijuana daily to help ease the stress of his grandnmother who was dying of Alzheimer's. Pt sts that now he only occasionally drinks alcohol and smokes marijuana and denies using any other substances.  Pt has not given blood or urine samples yet tonight for testing in the ED. Mom sts that she found a bottle of caffeine pills in his room. Pt sts he has no current charges pending against him or upcoming court dates but, did mention past charges.  When asked about the previous charges, pt became agitated and did not answer. Pt sts he eats well and has Obstructive Sleep Apnea which negatively affects his ability to sleep well.   Pt was dressed in a white tee shirt/street clothes and sitting on his hospital bed. Pt was alert, mostly uncooperative and irritable/unpleasant. Pt appeared paranoid, argumentative, suspicious and angry most of the time. Pt kept good eye contact almost staring at times and his eyes appeared glassy. Pt spoke in a loud voice and at a rapid, pressured pace. Pt moved in a erratic manner when moving. Pt's thought process was at times coherent and relevant but just as often, pt was lost in a flight of ideas or focused on his co-worker, Gwen.  Pt's judgement was clearly impaired.  Pt's mood was anxious and angry and his flat or angry affect was congruent.  Pt was oriented x 2, to person and place.   Diagnosis: 295.90 Schizophrenia,  paranoid type, by hx;   Past Medical History:  Past Medical History  Diagnosis Date  . Schizophrenia (HCC)   . Depression   . ADHD (attention deficit hyperactivity disorder)   . 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 No pertinent past surgical history.  Family History:  Family History  Problem Relation Age of Onset  . Diabetes Maternal Grandmother   . Alzheimer's disease Paternal Grandmother   . Heart failure Paternal Grandmother   . Heart failure Paternal Grandfather   . Heart failure Maternal Grandfather     Social  History:  reports that he has never smoked. He does not have any smokeless tobacco history on file. He reports that he does not drink alcohol or use illicit drugs.  Additional Social History:  Alcohol / Drug Use Prescriptions: See PTA list History of alcohol / drug use?: Yes Substance #1 Name of Substance 1: Marijuana 1 - Age of First Use: 10th grade 1 - Frequency: daily 1 - Duration: sts he has cut down but does not know frequency 1 - Last Use / Amount: "it's been a while" about 4 yrs ago Substance #2 Name of Substance 2: Alcohol 2 - Amount (size/oz): 1 drink 2 - Last Use / Amount: Christmas party  CIWA: CIWA-Ar BP: 143/85 mmHg Pulse Rate: 102 COWS:    PATIENT STRENGTHS: (choose at least two) Average or above average intelligence Communication skills Supportive family/friends  Allergies:  Allergies  Allergen Reactions  . Penicillins Rash    Home Medications:  (Not in a hospital admission)  OB/GYN Status:  No LMP for male patient.  General Assessment Data Location of Assessment: [facility] TTS Assessment: In system Is this a Tele or Face-to-Face Assessment?: Tele Assessment Is this an Initial Assessment or a Re-assessment for this encounter?: Initial Assessment Marital status: Single Maiden name: na Is patient pregnant?: No Pregnancy Status: No Living Arrangements: Parent (live with parents) Can pt return to current living arrangement?: Yes Admission Status: Voluntary Is patient capable of signing voluntary admission?: Yes Referral Source: Self/Family/Friend Insurance type: [facility]  Medical Screening Exam [facility] Walk-in ONLY) Medical Exam completed: Yes  Crisis Care Plan Living Arrangements: Parent (live with parents) [facility]rist[facility] of [facility]  Education Status Is patient currently in school?: Yes Highest grade of school patient has completed: 12 Name of school: [employer] person: na  Risk to self with the past  6 months Suicidal Ideation: No (denies) Has patient been a risk to self within the past 6 months prior to admission? : Yes Suicidal Intent: No (denies) Has patient had any suicidal intent within the past 6 months prior to admission? : No (denies) Is patient at risk for suicide?: No Suicidal Plan?: No (denies) Has patient had any suicidal plan within the past 6 months prior to admission? : No (denies) Access to Means: No (denies) What has been your use of drugs/alcohol within the last 12 months?: occasional Previous Attempts/Gestures: No (denies) How many times?: 0 Other Self Harm Risks: no Triggers for Past Attempts:  (na) Intentional Self Injurious Behavior: None Family Suicide History: Unknown (great aunt w schizophrenia) Recent stressful life event(s):  (conflict w a co-worker; Dad has cancer) Persecutory voices/beliefs?: Yes Depression: Yes Depression Symptoms:  (denies symptoms of depression) Substance abuse history and/or treatment for substance abuse?: Yes Suicide prevention information given to non-admitted patients: Not applicable  Risk to Others within the past 6 months Homicidal Ideation: No (denies) Does patient have any lifetime risk  of violence toward others beyond the six months prior to admission? : No (denies) Thoughts of Harm to Others: No (denies) Current Homicidal Intent: No (denies) Current Homicidal Plan: No (denies) Access to Homicidal Means: No (denies) Identified Victim: angry at manager at work, Naval architect (denies thoughts of harming her) History of harm to others?: No (denies) Assessment of Violence: None Noted Violent Behavior Description: none noted Does patient have access to weapons?: No (denies) Criminal Charges Pending?: No (denies) Does patient have a court date: No (denies) Is patient on probation?: No (denies)  Psychosis Hallucinations: None noted (denies) Delusions: Unspecified, Persecutory (paranoia)  Mental Status Report Appearance/Hygiene:  Disheveled, Unremarkable Eye Contact: Good (glassy eyes) Motor Activity: Freedom of movement, Unremarkable Speech: Pressured, Rapid, Aggressive Level of Consciousness: Alert, Irritable, Restless Mood: Anxious, Irritable, Suspicious Affect: Angry, Anxious, Irritable, Flat Anxiety Level: Moderate Judgement: Impaired Orientation: Person, Place Obsessive Compulsive Thoughts/Behaviors: None  Cognitive Functioning Concentration: Fair Memory: Recent Impaired, Remote Impaired IQ: Average Insight: Poor Impulse Control: Poor Appetite: Good Weight Loss: 0 Weight Gain: 0 Sleep: Decreased Total Hours of Sleep:  (poor sleep) Vegetative Symptoms: None  ADLScreening [facility] Assessment Services) Patient's cognitive ability adequate to safely complete daily activities?:  (altered mental status-uncertain) Patient able to express need for assistance with ADLs?:  (uncertain) Independently performs ADLs?: Yes (appropriate for developmental age)  Prior Inpatient Therapy Prior Inpatient Therapy: Yes Prior Therapy Dates: since approximately 2010 Prior Therapy Facilty/Provider(s): [facility] Reason for Treatment: Schizophrenia, paranoid type  Prior Outpatient Therapy Prior Outpatient Therapy: Yes Prior Therapy Dates: up until 1 year ago Prior Therapy Facilty/Provider(s): [facility] Reason for Treatment: Schizophrenia Does patient have an ACCT team?: No Does patient have Intensive In-House Services?  : No Does patient have [facility] services? : No Does patient have P4CC services?: No  ADL Screening (condition at time of admission) Patient's cognitive ability adequate to safely complete daily activities?:  (altered mental status-uncertain) Patient able to express need for assistance with ADLs?:  (uncertain) Independently performs ADLs?: Yes (appropriate for developmental age)       Abuse/Neglect Assessment (Assessment to be complete while patient is alone) Physical Abuse:  Denies Verbal Abuse: Denies Sexual Abuse: Denies Exploitation of patient/patient's resources: Denies Self-Neglect: Denies     Merchant navy officer (For Healthcare) Does patient have an advance directive?: No Would patient like information on creating an advanced directive?: No - patient declined information    Additional Information 1:1 In Past 12 Months?: No CIRT Risk: No Elopement Risk: No Does patient have medical clearance?: Yes     Disposition:  Disposition Initial Assessment Completed for this Encounter: Yes Disposition of Patient: Other dispositions (Pending review w [facility] Extednder) Other disposition(s): Other (Comment)  Per [name], PA: Pt Meet IP criteria. Recommend IP tx.  Per Clint Bolder, [facility] for [facility]: Accepted to [facility].  Room TBD    Beryle Flock, MS, [facility], [facility] Triage Specialist [facility] T 12/07/2015 8:13 PM

## 2015-12-07 NOTE — ED Provider Notes (Signed)
CSN: [id number]     Arrival date & time 12/07/15  1754 History   First MD Initiated Contact with Patient 12/07/15 1824     Chief Complaint  Patient presents with  . Medical Clearance     (Consider location/radiation/quality/duration/timing/severity/associated sxs/prior Treatment) Patient is a 25 y.o. male presenting with general illness. The history is provided by the patient.  Illness Severity:  Mild Onset quality:  Sudden Duration:  2 days Timing:  Constant Chronicity:  Chronic Associated symptoms: no abdominal pain, no chest pain, no congestion, no diarrhea, no fever, no headaches, no myalgias, no rash, no shortness of breath and no vomiting    25 yo M with a chief complaint of acting bizarrely. Patient has a history of schizophrenia and has not taken his medications in the past year. Patient has been acting increasingly aggressive at home and has made the family feel that he is not safe to be there. Patient denies any current complaints. Denies any hallucinations. Denies suicidal or homicidal ideation.   Past Medical History  Diagnosis Date  . Schizophrenia (HCC)   . Depression   . ADHD (attention deficit hyperactivity disorder)   . 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 No pertinent past surgical history. Family History  Problem Relation Age of Onset  . Diabetes Maternal Grandmother   . Alzheimer's disease Paternal Grandmother   . Heart failure Paternal Grandmother   . Heart failure Paternal Grandfather   . Heart failure Maternal Grandfather    Social History  Substance Use Topics  . Smoking status: Never Smoker   . Smokeless tobacco: None  . Alcohol Use: No    Review of Systems  Constitutional: Negative for fever and chills.  HENT: Negative for congestion and facial swelling.   Eyes: Negative for discharge and visual disturbance.  Respiratory: Negative for shortness of breath.   Cardiovascular: Negative for chest pain and palpitations.  Gastrointestinal:  Negative for vomiting, abdominal pain and diarrhea.  Musculoskeletal: Negative for myalgias and arthralgias.  Skin: Negative for color change and rash.  Neurological: Negative for tremors, syncope and headaches.  Psychiatric/Behavioral: Negative for confusion and dysphoric mood. The patient is nervous/anxious.       Allergies  Penicillins  Home Medications   Prior to Admission medications   Medication Sig Start Date End Date Taking? Authorizing Provider  ARIPiprazole (ABILIFY) 20 MG tablet Take 30 mg by mouth daily.  11/30/09   Historical Provider, MD  benztropine (COGENTIN) 1 MG tablet Take 1 mg by mouth 2 (two) times daily. 04/30/13   Historical Provider, MD  buPROPion (WELLBUTRIN XL) 150 MG 24 hr tablet Take 150 mg by mouth daily. 07/31/12   Historical Provider, MD  CAFFEINE PO Take by mouth.    Historical Provider, MD  sertraline (ZOLOFT) 100 MG tablet Take 200 mg by mouth daily. 07/31/12   Historical Provider, MD   BP 143/85 mmHg  Pulse 102  Temp(Src) 98.8 F (37.1 C) (Oral)  Resp 16  SpO2 100% Physical Exam  Constitutional: He is oriented to person, place, and time. He appears well-developed and well-nourished.  HENT:  Head: Normocephalic and atraumatic.  Eyes: EOM are normal. Pupils are equal, round, and reactive to light.  Neck: Normal range of motion. Neck supple. No JVD present.  Cardiovascular: Normal rate and regular rhythm.  Exam reveals no gallop and no friction rub.   No murmur heard. Pulmonary/Chest: No respiratory distress. He has no wheezes.  Abdominal: He exhibits no distension. There is no rebound and no  guarding.  Musculoskeletal: Normal range of motion.  Neurological: He is alert and oriented to person, place, and time.  Skin: No rash noted. No pallor.  Psychiatric: He has a normal mood and affect. His behavior is normal. Thought content is paranoid. He expresses impulsivity and inappropriate judgment. He expresses no homicidal and no suicidal ideation. He  expresses no suicidal plans and no homicidal plans.  Nursing note and vitals reviewed.   ED Course  Procedures (including critical care time) Labs Review Labs Reviewed  COMPREHENSIVE METABOLIC PANEL  ETHANOL  CBC  URINE RAPID DRUG SCREEN, HOSP PERFORMED    Imaging Review No results found. I have personally reviewed and evaluated these images and lab results as part of my medical decision-making.   EKG Interpretation None      MDM   Final diagnoses:  Schizoaffective disorder, unspecified type [facility])    25 yo M with a chief complaint of having a schizophrenic breakdown. TTS will evaluate. Psych to admit.   The patients results and plan were reviewed and discussed.   Any x-rays performed were independently reviewed by myself.   Differential diagnosis were considered with the presenting HPI.  Medications  OLANZapine zydis (ZYPREXA) disintegrating tablet 10 mg (not administered)  LORazepam (ATIVAN) tablet 1 mg (not administered)  ziprasidone (GEODON) injection 10 mg (not administered)  LORazepam (ATIVAN) injection 2 mg (not administered)    Filed Vitals:   12/07/15 1833  BP: 143/85  Pulse: 102  Temp: 98.8 F (37.1 C)  TempSrc: Oral  Resp: 16  SpO2: 100%    Final diagnoses:  Schizoaffective disorder, unspecified type (HCC)    Admission/ observation were discussed with the admitting physician, patient and/or family and they are comfortable with the plan.      Melene Plan, DO 12/07/15 2257

## 2015-12-08 ENCOUNTER — Inpatient Hospital Stay (HOSPITAL_COMMUNITY)
Admission: AD | Admit: 2015-12-08 | Discharge: 2015-12-13 | DRG: 885 | Disposition: A | Discharge status: Home / Self Care | Payer: Federal, State, Local not specified - Other | Attending: Psychiatry | Admitting: Psychiatry

## 2015-12-08 ENCOUNTER — Encounter (HOSPITAL_COMMUNITY): Payer: Self-pay

## 2015-12-08 DIAGNOSIS — F251 Schizoaffective disorder, depressive type: Secondary | ICD-10-CM | POA: Diagnosis present

## 2015-12-08 DIAGNOSIS — F200 Paranoid schizophrenia: Secondary | ICD-10-CM | POA: Diagnosis not present

## 2015-12-08 DIAGNOSIS — Z9114 Patient's other noncompliance with medication regimen: Secondary | ICD-10-CM

## 2015-12-08 DIAGNOSIS — F259 Schizoaffective disorder, unspecified: Secondary | ICD-10-CM | POA: Diagnosis not present

## 2015-12-08 DIAGNOSIS — F209 Schizophrenia, unspecified: Secondary | ICD-10-CM | POA: Diagnosis present

## 2015-12-08 LAB — COMPREHENSIVE METABOLIC PANEL
ALBUMIN: 4.7 g/dL (ref 3.5–5.0)
ALT: 28 U/L (ref 17–63)
AST: 25 U/L (ref 15–41)
Alkaline Phosphatase: 50 U/L (ref 38–126)
Anion gap: 10 (ref 5–15)
BUN: 15 mg/dL (ref 6–20)
CHLORIDE: 104 mmol/L (ref 101–111)
CO2: 25 mmol/L (ref 22–32)
CREATININE: 1.16 mg/dL (ref 0.61–1.24)
Calcium: 10 mg/dL (ref 8.9–10.3)
GFR calc Af Amer: >60 mL/min (ref >60)
GLUCOSE: 135 mg/dL — AB (ref 65–99)
POTASSIUM: 3.7 mmol/L (ref 3.5–5.1)
SODIUM: 139 mmol/L (ref 135–145)
Total Bilirubin: 1 mg/dL (ref 0.3–1.2)
Total Protein: 8 g/dL (ref 6.5–8.1)

## 2015-12-08 LAB — CBC
HEMATOCRIT: 44.3 % (ref 39.0–52.0)
HEMOGLOBIN: 14.2 g/dL (ref 13.0–17.0)
MCH: 27.5 pg (ref 26.0–34.0)
MCHC: 32.1 g/dL (ref 30.0–36.0)
MCV: 85.7 fL (ref 78.0–100.0)
Platelets: 235 10*3/uL (ref 150–400)
RBC: 5.17 MIL/uL (ref 4.22–5.81)
RDW: 14.7 % (ref 11.5–15.5)
WBC: 12.6 10*3/uL — ABNORMAL HIGH (ref 4.0–10.5)

## 2015-12-08 LAB — RAPID URINE DRUG SCREEN, HOSP PERFORMED
Amphetamines: NOT DETECTED
BARBITURATES: NOT DETECTED
Benzodiazepines: NOT DETECTED
COCAINE: NOT DETECTED
Opiates: NOT DETECTED
TETRAHYDROCANNABINOL: NOT DETECTED

## 2015-12-08 LAB — ETHANOL

## 2015-12-08 MED ORDER — HYDROXYZINE HCL 25 MG PO TABS: 25.0000 mg | ORAL_TABLET | Freq: Three times a day (TID) | ORAL | Status: DC | PRN

## 2015-12-08 MED ORDER — ARIPIPRAZOLE 10 MG PO TABS
10.0000 mg | ORAL_TABLET | Freq: Two times a day (BID) | ORAL | Status: DC
  Filled 2015-12-08 (×2): qty 1

## 2015-12-08 MED ORDER — OLANZAPINE 10 MG PO TBDP: 10.0000 mg | ORAL_TABLET | Freq: Two times a day (BID) | ORAL | Status: DC | PRN

## 2015-12-08 MED ORDER — TRAZODONE HCL 100 MG PO TABS
100.0000 mg | ORAL_TABLET | Freq: Every day | ORAL | Status: DC
Start: 2015-12-08 — End: 2015-12-08

## 2015-12-08 MED ORDER — OLANZAPINE 5 MG PO TBDP
5.0000 mg | ORAL_TABLET | Freq: Four times a day (QID) | ORAL | Status: DC | PRN
  Administered 2015-12-09 – 2015-12-10 (×3): 5 mg via ORAL
  Filled 2015-12-08 (×3): qty 1

## 2015-12-08 MED ORDER — TRAZODONE HCL 50 MG PO TABS
50.0000 mg | ORAL_TABLET | Freq: Every evening | ORAL | Status: DC | PRN
  Administered 2015-12-08 – 2015-12-12 (×6): 50 mg via ORAL
  Filled 2015-12-08 (×3): qty 1
  Filled 2015-12-08 (×2): qty 14
  Filled 2015-12-08 (×10): qty 1

## 2015-12-08 MED ORDER — ARIPIPRAZOLE 15 MG PO TABS: 30.0000 mg | ORAL_TABLET | Freq: Every day | ORAL | Status: DC

## 2015-12-08 MED ORDER — BENZTROPINE MESYLATE 1 MG PO TABS
1.0000 mg | ORAL_TABLET | Freq: Two times a day (BID) | ORAL | Status: DC
  Administered 2015-12-08: 1 mg via ORAL
  Filled 2015-12-08: qty 1

## 2015-12-08 MED ORDER — SERTRALINE HCL 50 MG PO TABS
100.0000 mg | ORAL_TABLET | Freq: Every day | ORAL | Status: DC
  Administered 2015-12-08: 100 mg via ORAL
  Filled 2015-12-08: qty 2

## 2015-12-08 MED ORDER — SERTRALINE HCL 50 MG PO TABS: 200.0000 mg | ORAL_TABLET | Freq: Every day | ORAL | Status: DC

## 2015-12-08 MED ORDER — OLANZAPINE 10 MG PO TBDP
10.0000 mg | ORAL_TABLET | Freq: Four times a day (QID) | ORAL | Status: DC | PRN
  Administered 2015-12-08: 10 mg via ORAL
  Filled 2015-12-08: qty 1

## 2015-12-08 NOTE — H&P (Signed)
Psychiatric Admission Assessment Adult  Patient Identification: George [name] MRN:  [id number] Date of Evaluation:  12/09/2015 Chief Complaint:  SCHIZOPHRENIA PARANID TYPE Principal Diagnosis: Schizophrenia (HCC) Diagnosis:   Patient Active Problem List   Diagnosis Date Noted  . Schizophrenia (HCC) [F20.9] 12/08/2015    Priority: High   History of Present Illness: George [name], 25 y.o. male was initially brought in voluntarily to the [facility] by his parents.  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  Today, patient today visibly got anxious as he recollected the events that brought him into hospital.  He stated his main trigger was tense work environment created by Science writer.  Patient  remained focused on Gwen throughout the assessment.  He denies HI towards her.  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  In his late teens, he was told he was Schizophrenic.  Patient could not remember the meds he had been on and he was vague when he was asked if he was compliant.    Patient stated that he told workers that he father was dealing with cancer and that this gave him a lot of stress.  He states that he lives with his parents and that he is upset that his parents were called by his work about his behavior .  Collateral information from mom includes that pt has been prescribed medications for his mental health diagnoses but, has not taken them for a bout 1 year.  Pt was being seen at [facility] for medication management and therapy. Per parents, pt has had "a psych breakdown" previously about 4 years ago and then 3 years later after that and was hospitalized.   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  He denies recreational substance use with the  exception of THC "before."  Associated Signs/Symptoms: Depression Symptoms:  difficulty concentrating, anxiety, (Hypo) Manic Symptoms:  Irritable Mood, Labiality of Mood, Anxiety Symptoms:  Excessive Worry, Psychotic Symptoms:  NA PTSD Symptoms: NA Total Time spent with patient: 45 minutes  Past Psychiatric History:   Schizoaffective disorder, depressive type (HCC) [F25.1]  Schizophrenia (HCC) [F20.9]   Risk to Self: Is patient at risk for suicide?: No What has been your use of drugs/alcohol within the last 12 months?: occasional use of alcohol, admits to smoking cannabis regularly in college Risk to Others:   Prior Inpatient Therapy:   Prior Outpatient Therapy:    Alcohol Screening: Patient refused Alcohol Screening Tool: Yes (Unable to get patient to answer or discuss anything beyond his disjointed account of work and how he came to be in the hosp and the paperwork Nurse is asking him to sign) 1. How often do you have a drink containing alcohol?: Never 9. Have you or someone else been injured as a result of your drinking?: No 10. Has a relative or friend or a doctor or another health worker been concerned about your drinking or suggested you cut down?: No Alcohol Use Disorder Identification Test Final Score (AUDIT): 0 Brief Intervention: Patient declined brief intervention Substance Abuse History in the last 12 months:  No. Denies Consequences of Substance Abuse: NA Previous Psychotropic Medications: Yes, Abilify Psychological Evaluations: Yes  Past Medical History:  Past Medical History  Diagnosis Date  .  Schizophrenia (HCC)   . Depression   . ADHD (attention deficit hyperactivity disorder)   . 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 No pertinent past surgical history. Family History:  Family History  Problem Relation Age of Onset  . Diabetes Maternal Grandmother   . Alzheimer's disease Paternal Grandmother   . Heart failure Paternal Grandmother   . Heart  failure Paternal Grandfather   . Heart failure Maternal Grandfather   . Cancer Father    Family Psychiatric  History:  Denies Tobacco Screening: @FLOW ([id number])::1)@ Social History:  History  Alcohol Use No     History  Drug Use No    Additional Social History:  Allergies:   Allergies  Allergen Reactions  . Penicillins Rash   Lab Results:  Results for orders placed or performed during the hospital encounter of 12/07/15 (from the past 48 hour(s))  Urine rapid drug screen (hosp performed) (Not at [facility])     Status: None   Collection Time: 12/08/15  1:09 AM  Result Value Ref Range   Opiates NONE DETECTED NONE DETECTED   Cocaine NONE DETECTED NONE DETECTED   Benzodiazepines NONE DETECTED NONE DETECTED   Amphetamines NONE DETECTED NONE DETECTED   Tetrahydrocannabinol NONE DETECTED NONE DETECTED   Barbiturates NONE DETECTED NONE DETECTED    Comment:        DRUG SCREEN FOR MEDICAL PURPOSES ONLY.  IF CONFIRMATION IS NEEDED FOR ANY PURPOSE, NOTIFY LAB WITHIN 5 DAYS.        LOWEST DETECTABLE LIMITS FOR URINE DRUG SCREEN Drug Class       Cutoff (ng/mL) Amphetamine      1000 Barbiturate      200 Benzodiazepine   200 Tricyclics       300 Opiates          300 Cocaine          300 THC              50     Metabolic Disorder Labs:  No results found for: HGBA1C, MPG No results found for: PROLACTIN No results found for: CHOL, TRIG, HDL, CHOLHDL, VLDL, LDLCALC  Current Medications: Current Facility-Administered Medications  Medication Dose Route Frequency Provider Last Rate Last Dose  . acetaminophen (TYLENOL) tablet 650 mg  650 mg Oral Q6H PRN Jomarie Longs, MD      . hydrOXYzine (ATARAX/VISTARIL) tablet 25 mg  25 mg Oral TID PRN Jomarie Longs, MD      . OLANZapine zydis (ZYPREXA) disintegrating tablet 5 mg  5 mg Oral Q6H PRN Adonis Brook, NP   5 mg at 12/09/15 1558  . traZODone (DESYREL) tablet 50 mg  50 mg Oral QHS,MR X 1 Kerry Hough, PA-C   50 mg at 12/08/15 2159   . ziprasidone (GEODON) capsule 20 mg  20 mg Oral Q supper Jomarie Longs, MD   20 mg at 12/09/15 1600   PTA Medications: Prescriptions prior to admission  Medication Sig Dispense Refill Last Dose  . ARIPiprazole (ABILIFY) 20 MG tablet Take 30 mg by mouth daily.    over 1 year at unknown time  . benztropine (COGENTIN) 1 MG tablet Take 1 mg by mouth 2 (two) times daily.   over 1 year at unknown time  . buPROPion (WELLBUTRIN XL) 150 MG 24 hr tablet Take 150 mg by mouth daily.   over 1 year at unknown time  . sertraline (ZOLOFT) 100 MG tablet Take 200 mg by mouth daily.   over 1 year at unknown  time    Musculoskeletal: Strength & Muscle Tone: within normal limits Gait & Station: normal Patient leans: N/A  Psychiatric Specialty Exam: Physical Exam  ROS  Blood pressure 104/64, pulse 75, temperature 98.3 F (36.8 C), temperature source Oral, resp. rate 20, height  (1.803 m), weight 138.801 kg (306 lb), SpO2 100 %.Body mass index is 42.7 kg/(m^2).   General Appearance: Disheveled  Eye Solicitor:: Fair  Speech: Clear and Coherent  Volume: Normal  Mood: Angry, Anxious and Irritable  Affect: Congruent  Thought Process: Irrelevant  Orientation: Full (Time, Place, and Person)  Thought Content: Delusions  Suicidal Thoughts: No  Homicidal Thoughts: No  Memory: Immediate; Fair Recent; Fair Remote; Fair  Judgement: Impaired  Insight: Shallow  Psychomotor Activity: Normal  Concentration: Fair  Recall: [employer] of Knowledge:Fair  Language: Fair  Akathisia: No  Handed: Right  AIMS (if indicated):    Assets: Desire for Improvement  Sleep: Number of Hours: 6  Cognition: WNL  ADL's: Intact       Treatment Plan Summary: Patient will benefit from inpatient treatment and stabilization.  Estimated length of stay is 5-7 days.  Reviewed past medical records,treatment plan.  Will start a trial of Geodon 20 mg po with supper  for psychosis/mood sx. Will start PRN medications as per agitation protocol. Will add Trazodone 50 mg po qhs for sleep. Will continue to monitor vitals ,medication compliance and treatment side effects while patient is here.  Will monitor for medical issues as well as call consult as needed.  CSW will start working on disposition.  Patient to participate in therapeutic milieu .   Observation Level/Precautions:  15 minute checks  Laboratory:   cbc- wnl, cmp - wnl , UDS - negative , BAL<5 ,will order TSH, lipid panel, Hba1c, PL as well as EKG for qtc.  Psychotherapy:  group  Medications:  As per medlist, see above treatment plan  Consultations:  As needed  Discharge Concerns:  safety  Estimated LOS:  2-7 days  Other:     I certify that inpatient services furnished can reasonably be expected to improve the patient's condition.    Lindwood Qua, NP AGNP-BC 2/2/20174:13 PM

## 2015-12-08 NOTE — Progress Notes (Signed)
DAR NOTE: Patient presents with anxious affect and depressed mood. Pt endorses episodes aggression and agitation towards objects and others, pt was also seen going into other patients rooms, was easily redirected.  Denies pain,but observed responding to internal stimuli. Pt denied SI/HI.  Rates depression at 3, hopelessness at 0, and anxiety at 5.  Maintained on routine safety checks.  Medications given as prescribed.  Support and encouragement offered as needed. Patient observed socializing with peers in the dayroom.

## 2015-12-08 NOTE — ED Notes (Signed)
Pt is gamey and reluctant to take medications. Pt went to the bathroom saying that he was getting more water and put his cup under the shower. Pt had 3/4th cup of water given to him with his medication and he said that he wanted it warm as he walked to the bathroom. Writer attempted to observe pt to make sure that he took his medication. After pt returned his cup, he went to the bathroom and bent over the sink. He then went to get his tooth brush and brush his teeth.

## 2015-12-08 NOTE — Progress Notes (Signed)
Nursing Admission Note  Patient eating in Search room eating lunch. Is non cooperative in the admisssion process but is obsessive about the contents in his wallet and removes and examines them frequently. Nurse and tech attempting to move patient along as he is obessing ever more about his wallet and its contents. Patient leaning back in chair with arms folded across his belly and glaring at Nurse. Look indicates how close to possibly losing his temper as mood and behaviors are both extremely labile and his actions and words exhibit extreme paranoia and mistrust. Able to get patient to sign Tx agreement but not the Falls sheet. Patient also obsessing and tapping pen loudly and rhythmicallly while stating "i need to study this some more", again exhibiting growing anger and staring. Patient will not answer any questionns about SI, HI, AVH but does state he hasnt taken his medicine "in a real long time" because he thinks "I don't need it".

## 2015-12-08 NOTE — ED Notes (Signed)
Pt is sleeping soundly with equal chest rise and fall noted.  Safety sitter at the bedside.  No acute distress.

## 2015-12-08 NOTE — ED Notes (Signed)
Per Careers adviser at [facility], they will contact [facility] will they are ready for transport.

## 2015-12-08 NOTE — Progress Notes (Addendum)
[facility] ED CM noted pt with coverage but no pcp listed   Pt states he believes he has an [place] card in his wallet but not for sure.  Pt had to be redirected to listen to CM vs stopping Cm every 15 seconds to ask questions about his d/c plans Referred to [facility] NA/RN for d/c plans Cm reviewed ED level of care for crisis/emergent services and community pcp level of care to manage continuous or chronic medical concerns.  The pt voiced understanding CM encouraged pt and discussed pt's responsibility to verify with pt's insurance carrier that any recommended medical provider offered by any emergency room or a hospital provider is within the carrier's network. The pt voiced understanding    entered in d/c instructions Please use the toll free number and or the website on your [employer]etna insurance card to get assistance to find a doctor for follow up care Schedule an appointment as soon as possible for a visit As needed

## 2015-12-08 NOTE — Progress Notes (Signed)
Adult Psychoeducational Group Note  Date:  12/08/2015 Time:  9:44 PM  Group Topic/Focus:  Wrap-Up Group:   The focus of this group is to help patients review their daily goal of treatment and discuss progress on daily workbooks.  Participation Level:  Active  Participation Quality:  Appropriate  Affect:  Appropriate  Cognitive:  Alert  Insight: Appropriate  Engagement in Group:  Engaged  Modes of Intervention:  Discussion  Additional Comments:  Patient stated he did not have a goal for today, but will be working on it for tomorrow. On a scale between 1-10, (1=worse, 10=best) patient rated his day as a 10 because " I wasn't angry as I was yesterday".  George [name] L George [name] 12/08/2015, 9:44 PM

## 2015-12-08 NOTE — ED Notes (Signed)
Patient's agitation rising. Ativan offered but patient refused.

## 2015-12-08 NOTE — ED Notes (Signed)
Pt belongings-[employer] with college ID, Library card, [employer], debit card, NO cash except for 2 quarters and 1 nickel. Jeans, black shoes, black belt.

## 2015-12-08 NOTE — ED Notes (Signed)
Pt is guarded and paranoid watching police and security. Pt attempts to Personnel officer stepping into personal space after being asked to step back multiple times. Pt makes references to police officers and Vaughan Basta along with another business in [place]. Pt denies any medical hx. He verbally denies si and hi at this time.

## 2015-12-08 NOTE — ED Notes (Signed)
Belongings placed in locker 27 

## 2015-12-09 ENCOUNTER — Encounter (HOSPITAL_COMMUNITY): Payer: Self-pay | Admitting: Psychiatry

## 2015-12-09 MED ORDER — HYDROXYZINE HCL 25 MG PO TABS
25.0000 mg | ORAL_TABLET | Freq: Three times a day (TID) | ORAL | Status: DC | PRN
  Administered 2015-12-09 – 2015-12-11 (×2): 25 mg via ORAL
  Filled 2015-12-09: qty 10
  Filled 2015-12-09 (×2): qty 1

## 2015-12-09 MED ORDER — ACETAMINOPHEN 325 MG PO TABS: 650.0000 mg | ORAL_TABLET | Freq: Four times a day (QID) | ORAL | Status: DC | PRN

## 2015-12-09 MED ORDER — ZIPRASIDONE HCL 20 MG PO CAPS
20.0000 mg | ORAL_CAPSULE | Freq: Every day | ORAL | Status: DC
  Administered 2015-12-09: 20 mg via ORAL
  Filled 2015-12-09 (×3): qty 1

## 2015-12-09 NOTE — [facility] Suicide Risk Assessment (Signed)
[facility] INPATIENT:  Family/Significant Other Suicide Prevention Education  Suicide Prevention Education:  Education Completed; No one has been identified by the patient as the family member/significant other with whom the patient will be residing, and identified as the person(s) who will aid the patient in the event of a mental health crisis (suicidal ideations/suicide attempt).  With written consent from the patient, the family member/significant other has been provided the following suicide prevention education, prior to the and/or following the discharge of the patient.  The suicide prevention education provided includes the following:  Suicide risk factors  Suicide prevention and interventions  National Suicide Hotline telephone number  [facility] assessment telephone number  [facility] Emergency Assistance 911  [facility] and/or Residential Mobile Crisis Unit telephone number  Request made of family/significant other to:  Remove weapons (e.g., guns, rifles, knives), all items previously/currently identified as safety concern.    Remove drugs/medications (over-the-counter, prescriptions, illicit drugs), all items previously/currently identified as a safety concern.  The family member/significant other verbalizes understanding of the suicide prevention education information provided.  The family member/significant other agrees to remove the items of safety concern listed above. The patient did not endorse SI at the time of admission, nor did the patient c/o SI during the stay here.  SPE not required. However, I did talk to mother, Ms Nyal, [address] [phone], about a crises plan.  Daryel Gerald B 12/09/2015, 5:13 PM

## 2015-12-09 NOTE — Progress Notes (Signed)
Patient ID: George [name], male   DOB: 1991-02-02, 25 y.o.   MRN: [id number] D: " I feel better than I was earlier". Pt appeared calm from medication given earlier. Pt requested medication for sleep. Pt mood and affect appeared depressed and flat. Pt denies SI/HI/AVH and pain. Pt attended and participated in evening wrap up group. Cooperative with assessment.    A: Met with pt 1:1. Medications administered as prescribed. Support and encouragement provided to attend groups and engage in milieu. Pt encouraged to discuss feelings and come to staff with any question or concerns.   R: Patient remains safe and complaint with medications.

## 2015-12-09 NOTE — [facility] Group Notes (Signed)
[facility] LCSW Group Therapy  12/09/2015 1:15 pm  Type of Therapy: Process Group Therapy  Participation Level:  Active  Participation Quality:  Appropriate  Affect:  Flat  Cognitive:  Oriented  Insight:  Improving  Engagement in Group:  Limited  Engagement in Therapy:  Limited  Modes of Intervention:  Activity, Clarification, Education, Problem-solving and Support  Summary of Progress/Problems: Today's group addressed the issue of overcoming obstacles.  Patients were asked to identify their biggest obstacle post d/c that stands in the way of their on-going success, and then problem solve as to how to manage this.  Sat quietly for most of group.  Contributed only when asked directly.  Unable to come up with any obstacles, even when prompted by possibilities, but did talk about mental health diagnosis in terms of "feeling different from others, and no one understands."  Colleenfort B 12/09/2015   1:24 PM

## 2015-12-09 NOTE — [facility] Suicide Risk Assessment (Signed)
[facility] Admission Suicide Risk Assessment   Nursing information obtained from:    Demographic factors:    Current Mental Status:    Loss Factors:    Historical Factors:    Risk Reduction Factors:     Total Time spent with patient: 30 minutes Principal Problem: Schizophrenia (HCC) Diagnosis:   Patient Active Problem List   Diagnosis Date Noted  . Schizophrenia (HCC) [F20.9] 12/08/2015   Subjective Data: Pt is a 25 y old AAM with hx of schizophrenia , who was found to be acting strange while at work and was brought in to ED by parents. Pt today seen as irritable, angry , denies any psychosis, however continues to ruminate about the events that happened at work prior to admission. Pt is willing to start Geodon for his mood sx.   Continued Clinical Symptoms:  Alcohol Use Disorder Identification Test Final Score (AUDIT): 0 The "Alcohol Use Disorders Identification Test", Guidelines for Use in Primary Care, Second Edition.  World Science writer [facility]). Score between 0-7:  no or low risk or alcohol related problems. Score between 8-15:  moderate risk of alcohol related problems. Score between 16-19:  high risk of alcohol related problems. Score 20 or above:  warrants further diagnostic evaluation for alcohol dependence and treatment.   CLINICAL FACTORS:   Schizophrenia:   Less than 26 years old Paranoid or undifferentiated type Unstable or Poor Therapeutic Relationship Previous Psychiatric Diagnoses and Treatments   Musculoskeletal: Strength & Muscle Tone: within normal limits Gait & Station: normal Patient leans: N/A  Psychiatric Specialty Exam: Review of Systems  Psychiatric/Behavioral: The patient is nervous/anxious.   All other systems reviewed and are negative.   Blood pressure 104/64, pulse 75, temperature 98.3 F (36.8 C), temperature source Oral, resp. rate 20, height  (1.803 m), weight 138.801 kg (306 lb), SpO2 100 %.Body mass index is 42.7 kg/(m^2).  General  Appearance: Disheveled  Eye Solicitor::  Fair  Speech:  Clear and Coherent  Volume:  Normal  Mood:  Angry, Anxious and Irritable  Affect:  Congruent  Thought Process:  Irrelevant  Orientation:  Full (Time, Place, and Person)  Thought Content:  Delusions  Suicidal Thoughts:  No  Homicidal Thoughts:  No  Memory:  Immediate;   Fair Recent;   Fair Remote;   Fair  Judgement:  Impaired  Insight:  Shallow  Psychomotor Activity:  Normal  Concentration:  Fair  Recall:  [employer] of Knowledge:Fair  Language: Fair  Akathisia:  No  Handed:  Right  AIMS (if indicated):     Assets:  Desire for Improvement  Sleep:  Number of Hours: 6  Cognition: WNL  ADL's:  Intact    COGNITIVE FEATURES THAT CONTRIBUTE TO RISK:  Closed-mindedness, Polarized thinking and Thought constriction (tunnel vision)    SUICIDE RISK:   Mild:  Suicidal ideation of limited frequency, intensity, duration, and specificity.  There are no identifiable plans, no associated intent, mild dysphoria and related symptoms, good self-control (both objective and subjective assessment), few other risk factors, and identifiable protective factors, including available and accessible social support.  PLAN OF CARE: Patient will benefit from inpatient treatment and stabilization.  Estimated length of stay is 5-7 days.  Reviewed past medical records,treatment plan.  Will start a trial of Geodon 20 mg po with supper for psychosis/mood sx. Will start PRN medications as per agitation protocol. Will add Trazodone 50 mg po qhs for sleep. Will continue to monitor vitals ,medication compliance and treatment side effects while  patient is here.  Will monitor for medical issues as well as call consult as needed.  Reviewed labs - cbc- wnl, cmp - wnl , UDS - negative , BAL<5 ,will order TSH, lipid panel, Hba1c, PL as well as EKG for qtc. CSW will start working on disposition.  Patient to participate in therapeutic milieu .        I certify  that inpatient services furnished can reasonably be expected to improve the patient's condition.   Nyheem Binette, MD 12/09/2015, 11:40 AM

## 2015-12-09 NOTE — Plan of Care (Signed)
Problem: Diagnosis: Increased Risk For Suicide Attempt Goal: STG-Patient Will Comply With Medication Regime Outcome: Progressing Pt compliant with medication regime     

## 2015-12-09 NOTE — [facility] Group Notes (Signed)
Pt attended karaoke group and socialized with peers.  George [name], MHT 

## 2015-12-09 NOTE — [facility] Counselor (Signed)
Adult Comprehensive Assessment  Patient ID: George [name], male   DOB: 02-07-1991, 25 y.o.   MRN: [id number]  Information Source: Information source: Patient  Current Stressors:  Educational / Learning stressors: Taking on line courses for master degree Employment / Job issues: working full time at [employer] / Lack of housing: lives with parents and brother Physical health (include injuries & life threatening diseases): Gained 100 lbs over a years time while taking meds after first being diagnosed  Living/Environment/Situation:  Living Arrangements: Parent Living conditions (as described by patient or guardian): good How long has patient lived in current situation?: since graduation, prior to that was living on campus at [employer] What is atmosphere in current home: Comfortable, Supportive, Loving  Family History:  Are you sexually active?: No Does patient have children?: No  Childhood History:  By whom was/is the patient raised?: Both parents Description of patient's relationship with caregiver when they were a child: good Patient's description of current relationship with people who raised him/her: good, but sometimes they get into my business too much How were you disciplined when you got in trouble as a child/adolescent?: "the ususal" Does patient have siblings?: Yes Number of Siblings: 2 Description of patient's current relationship with siblings: good with both-though trials with brother at times-he is living at home as well Did patient suffer any verbal/emotional/physical/sexual abuse as a child?: No Did patient suffer from severe childhood neglect?: No Has patient ever been sexually abused/assaulted/raped as an adolescent or adult?: No Was the patient ever a victim of a crime or a disaster?: No Witnessed domestic violence?: No Has patient been effected by domestic violence as an adult?: No  Education:  Highest grade of school patient has completed:  Doctor, general practice Currently a student?: Yes How long has the patient attended?: On line classes for masters in [place] Learning disability?: No  Employment/Work Situation:   Employment situation: Employed Where is patient currently employed?: [employer] How long has patient been employed?: 7 mos Patient's job has been impacted by current illness: Yes Describe how patient's job has been impacted: worker's became concerned, called for help from family What is the longest time patient has a held a job?: same Where was the patient employed at that time?: same Has patient ever been in the [employer]?: No Has patient ever served in combat?: No  Financial Resources:   Financial resources: Income from employment Does patient have a representative payee or guardian?: No  Alcohol/Substance Abuse:   What has been your use of drugs/alcohol within the last 12 months?: occasional use of alcohol, admits to smoking cannabis regularly in college Alcohol/Substance Abuse Treatment Hx: Denies past history Has alcohol/substance abuse ever caused legal problems?: Yes (record has been expunged from a paraphernalia charge)  Social Support System:   Patient's Community Support System: Fair Development worker, community Support System: Family Type of faith/religion: "I was raised in church" How does patient's faith help to cope with current illness?: "I pray"  Leisure/Recreation:   Leisure and Hobbies: reading, writing, listening to music  Strengths/Needs:   What things does the patient do well?: writing In what areas does patient struggle / problems for patient: paying attention  Discharge Plan:   Does patient have access to transportation?: Yes George [name] patient be returning to same living situation after discharge?: Yes Currently receiving community mental health services: No If no, would patient like referral for services when discharged?: Yes (What county?) [facility] sales [place]) Does patient have financial barriers  related to discharge medications?: No  Summary/Recommendations:   Summary and Recommendations (to be completed by the evaluator): George [name] is a 25 YO AA male was intially diagnosed with schizophrenia in 2013, and returns to [place] with symptoms of same.  He was compliant with outpatient appointments and medication for about a year, and then decided to stop, so has not been treated for at least a year.  He maintened well until recently, when he became symptomatic at work, and another employee called his parents to register therir concern, and they brought him to the hospital. He can benefit from crises stabilization, medication management, therapetuic milieu and referral for services.  George Gerald B. 12/09/2015

## 2015-12-09 NOTE — Tx Team (Signed)
Interdisciplinary Treatment Plan Update (Adult)  Date:  12/09/2015   Time Reviewed:  9:29 AM   Progress in Treatment: Attending groups: Yes. Participating in groups:  Yes. Taking medication as prescribed:  Yes. Tolerating medication:  Yes. Family/Significant other contact made:  Yes  Parents Patient understands diagnosis:  No  Limited insght Discussing patient identified problems/goals with staff:  Yes, see initial care plan. Medical problems stabilized or resolved:  Yes. Denies suicidal/homicidal ideation: Yes. Issues/concerns per patient self-inventory:  No. Other:  New problem(s) identified:  Discharge Plan or Barriers:  Reason for Continuation of Hospitalization: Medication stabilization Other; describe Paranoia, disorganization  Comments:  George [name] is an 25 y.o. African-American male who was brought voluntarily to the [facility] by his parents after they received a call from his employer today. Per parents, pt's manager called to tell them that pt "was not himself" and "was acting strange." Per pt, he is "confused" about the events at work today but stated that he told a friend, Amy, at work that his father has cancer and pt sts talking to her was comforting. After that, pt sts others came to him asking questions and he ended up in his manager's office. Per parents, pt has been previously diagnosed with schizophrenia, paranoid type. Per mom, pt has been prescribed medications for his mental health diagnoses but, has not taken them for a bout 1 year. Pt was being seen at [facility] for medication management and therapy.Will start a trial of Geodon 20 mg po with supper for psychosis/mood sx. Will start PRN medications as per agitation protocol. Will add Trazodone 50 mg po qhs for sleep.    Estimated length of stay: 4-5 days  New goal(s):  Review of initial/current patient goals per problem list:   Review of initial/current patient goals per problem list:  1. Goal(s):  Patient will participate in aftercare plan   Met:    Target date: 3-5 days post admission date   As evidenced by: Patient will participate within aftercare plan AEB aftercare provider and housing plan at discharge being identified. 12/09/15:  Return home, follow up outpt     5. Goal(s): Patient will demonstrate decreased signs of psychosis  * Met: No  * Target date: 3-5 days post admission date  * As evidenced by: Patient will demonstrate decreased frequency of AVH or return to baseline function 12/09/15:  Pt presents with paranoia, though blocking and disorganization.  Has not been taking any psychiatric meds.           Attendees: Patient:  12/09/2015 9:29 AM   Family:   12/09/2015 9:29 AM   Physician:  Ursula Alert, MD 12/09/2015 9:29 AM   Nursing:   Hedy Jacob, RN 12/09/2015 9:29 AM   CSW:    Roque Lias, LCSW   12/09/2015 9:29 AM   Other:  12/09/2015 9:29 AM   Other:   12/09/2015 9:29 AM   Other:  Lars Pinks, Nurse CM 12/09/2015 9:29 AM   Other:   12/09/2015 9:29 AM   Other:  Norberto Sorenson, [place]  12/09/2015 9:29 AM   Other:  12/09/2015 9:29 AM   Other:  12/09/2015 9:29 AM   Other:  12/09/2015 9:29 AM   Other:  12/09/2015 9:29 AM   Other:  12/09/2015 9:29 AM   Other:   12/09/2015 9:29 AM    Scribe for Treatment Team:   Trish Mage, 12/09/2015 9:29 AM

## 2015-12-09 NOTE — Progress Notes (Signed)
NUTRITION ASSESSMENT  Pt identified as at risk on the Malnutrition Screen Tool  INTERVENTION: 1. Educated patient on the importance of nutrition and encouraged intake of food and beverages. 2. Discussed weight goals. 3. Supplements: none at this time  NUTRITION DIAGNOSIS: No nutrition dx.  Goal: Pt to meet >/= 90% of their estimated nutrition needs.  Monitor:  PO intake  Assessment:  Pt seen for MST. Pt admitted for schizophrenic breakdown. Pt is unsure about weight hx PTA. Limited weight hx/no recent weight hx available in the chart. Pt eating well at this time and no intervention warranted.   25 y.o. male  Height: Ht Readings from Last 1 Encounters:  12/08/15  (1.803 m)    Weight: Wt Readings from Last 1 Encounters:  12/08/15 306 lb (138.801 kg)    Weight Hx: Wt Readings from Last 10 Encounters:  12/08/15 306 lb (138.801 kg)  04/30/14 325 lb (147.419 kg)  08/06/12 300 lb (136.079 kg)    BMI:  Body mass index is 42.7 kg/(m^2). Pt meets criteria for morbid obesity based on current BMI.  Estimated Nutritional Needs: Kcal: 25-30 kcal/kg Protein: > 1 gram protein/kg Fluid: 1 ml/kcal  Diet Order: Diet regular Room service appropriate?: Yes; Fluid consistency:: Thin Pt is also offered choice of unit snacks mid-morning and mid-afternoon.  Pt is eating as desired.   Lab results and medications reviewed.     Trenton Gammon, RD, LDN Inpatient Clinical Dietitian Pager # [phone] After hours/weekend pager # [phone]

## 2015-12-10 LAB — LIPID PANEL
Cholesterol: 119 mg/dL (ref 0–200)
HDL: 34 mg/dL — ABNORMAL LOW
LDL Cholesterol: 67 mg/dL (ref 0–99)
Total CHOL/HDL Ratio: 3.5 ratio
Triglycerides: 91 mg/dL
VLDL: 18 mg/dL (ref 0–40)

## 2015-12-10 LAB — TSH: TSH: 1.744 u[IU]/mL (ref 0.350–4.500)

## 2015-12-10 MED ORDER — OLANZAPINE 10 MG PO TABS
10.0000 mg | ORAL_TABLET | Freq: Once | ORAL | Status: AC
Start: 2015-12-10 — End: 2015-12-10
  Administered 2015-12-10: 10 mg via ORAL
  Filled 2015-12-10: qty 1

## 2015-12-10 MED ORDER — OLANZAPINE 5 MG PO TBDP
5.0000 mg | ORAL_TABLET | ORAL | Status: DC
  Administered 2015-12-10: 10 mg via ORAL
  Administered 2015-12-10 – 2015-12-11 (×2): 5 mg via ORAL
  Filled 2015-12-10 (×5): qty 1

## 2015-12-10 MED ORDER — QUETIAPINE FUMARATE 50 MG PO TABS
50.0000 mg | ORAL_TABLET | Freq: Three times a day (TID) | ORAL | Status: DC | PRN
  Administered 2015-12-10: 50 mg via ORAL
  Filled 2015-12-10: qty 1

## 2015-12-10 MED ORDER — LORAZEPAM 1 MG PO TABS
1.0000 mg | ORAL_TABLET | Freq: Four times a day (QID) | ORAL | Status: DC | PRN
  Administered 2015-12-11 – 2015-12-12 (×3): 1 mg via ORAL
  Filled 2015-12-10 (×3): qty 1

## 2015-12-10 MED ORDER — LAMOTRIGINE 25 MG PO TABS
25.0000 mg | ORAL_TABLET | Freq: Every day | ORAL | Status: DC
  Administered 2015-12-10 – 2015-12-11 (×2): 25 mg via ORAL
  Filled 2015-12-10 (×3): qty 1

## 2015-12-10 MED ORDER — BENZTROPINE MESYLATE 0.5 MG PO TABS
0.5000 mg | ORAL_TABLET | ORAL | Status: DC
  Administered 2015-12-10 – 2015-12-13 (×6): 0.5 mg via ORAL
  Filled 2015-12-10 (×2): qty 1
  Filled 2015-12-10: qty 14
  Filled 2015-12-10 (×5): qty 1
  Filled 2015-12-10: qty 14
  Filled 2015-12-10: qty 1

## 2015-12-10 MED ORDER — LORAZEPAM 2 MG/ML IJ SOLN: 1.0000 mg | Freq: Four times a day (QID) | INTRAMUSCULAR | Status: DC | PRN

## 2015-12-10 NOTE — Progress Notes (Signed)
Adult Psychoeducational Group Note  Date:  12/10/2015 Time:  8:57 PM  Group Topic/Focus:  Wrap-Up Group:   The focus of this group is to help patients review their daily goal of treatment and discuss progress on daily workbooks.  Participation Level:  Active  Participation Quality:  Appropriate  Affect:  Appropriate  Cognitive:  Appropriate  Insight: Appropriate  Engagement in Group:  Engaged  Modes of Intervention:  Discussion  Additional Comments: The patient expressed he rates his day a 6.The patient also said he attended group.  Octavio Manns 12/10/2015, 8:57 PM

## 2015-12-10 NOTE — Progress Notes (Addendum)
[facility] MD Progress Note  12/10/2015 10:25 PM George [name]  MRN:  [id number] Subjective:  Patient states " When will I go home?  This place is getting to me."  Objective: George [name] is a 25 y.o. AA male who is single , employed ,lives with his parents in [place] , has a hx of Schizophrenia , was initially brought in voluntarily to the [facility] by his parents for bizarre behavior at work.  Patient seen and chart reviewed.Discussed patient with treatment team.  Today, he became irritated with this writer when he was told that their was no d/c plan in place.  He is intrusive to this NP. Pt today is irritable, pacing the hallways.  Per nursing, he is paranoid and labile.  He made statements that someone had stolen his identity. Per staff - pt continues to be labile, loud on the unit at times. Pt compliant with med regiment thus far.  Principal Problem: Schizophrenia (HCC) Diagnosis:   Patient Active Problem List   Diagnosis Date Noted  . Schizophrenia (HCC) [F20.9] 12/08/2015    Priority: High   Total Time spent with patient: 30 minutes  Past Psychiatric History: Pt with hx of schizophrenia , follows up with [facility]. Pt has been noncompliant on his psychotropic medications. Pt has had previous inpatient hospitalizations. Denies hx of suicide attempts.   Past Medical History:  Past Medical History  Diagnosis Date  . Schizophrenia (HCC)   . Depression   . ADHD (attention deficit hyperactivity disorder)   . Sleep disorder 04/30/2014   Family History:  Family History  Problem Relation Age of Onset  . Diabetes Maternal Grandmother   . Alzheimer's disease Paternal Grandmother   . Heart failure Paternal Grandmother   . Heart failure Paternal Grandfather   . Heart failure Maternal Grandfather   . Cancer Father    Family Psychiatric  History: Pt denies Social History: Pt is single , works at a [employer]. Pt lives with his parents. History  Alcohol Use No     History   Drug Use No    Social History   Social History  . Marital Status: Single    Spouse Name: N/A  . Number of Children: N/A  . Years of Education: N/A   Social History Main Topics  . Smoking status: Never Smoker   . Smokeless tobacco: None  . Alcohol Use: No  . Drug Use: No  . Sexual Activity: Not Asked     Comment: Unknown   Other Topics Concern  . None   Social History Narrative   Additional Social History:   Sleep: Fair  Appetite:  Fair  Current Medications: Current Facility-Administered Medications  Medication Dose Route Frequency Provider Last Rate Last Dose  . acetaminophen (TYLENOL) tablet 650 mg  650 mg Oral Q6H PRN Jomarie Longs, MD      . benztropine (COGENTIN) tablet 0.5 mg  0.5 mg Oral BH-qamhs Saramma Eappen, MD   0.5 mg at 12/10/15 2132  . hydrOXYzine (ATARAX/VISTARIL) tablet 25 mg  25 mg Oral TID PRN Jomarie Longs, MD   25 mg at 12/09/15 2210  . lamoTRIgine (LAMICTAL) tablet 25 mg  25 mg Oral Daily Saramma Eappen, MD   25 mg at 12/10/15 1302  . LORazepam (ATIVAN) tablet 1 mg  1 mg Oral Q6H PRN Jomarie Longs, MD       Or  . LORazepam (ATIVAN) injection 1 mg  1 mg Intramuscular Q6H PRN Jomarie Longs, MD      .  OLANZapine zydis (ZYPREXA) disintegrating tablet 5 mg  5 mg Oral BH-qamhs Saramma Eappen, MD   5 mg at 12/10/15 2132  . QUEtiapine (SEROQUEL) tablet 50 mg  50 mg Oral TID PRN Jomarie Longs, MD   50 mg at 12/10/15 1302  . traZODone (DESYREL) tablet 50 mg  50 mg Oral QHS,MR X 1 Kerry Hough, PA-C   50 mg at 12/10/15 2132    Lab Results:  Results for orders placed or performed during the hospital encounter of 12/08/15 (from the past 48 hour(s))  Lipid panel     Status: Abnormal   Collection Time: 12/10/15  6:30 AM  Result Value Ref Range   Cholesterol 119 0 - 200 mg/dL   Triglycerides 91 <696 mg/dL   HDL 34 (L) >29 mg/dL   Total CHOL/HDL Ratio 3.5 RATIO   VLDL 18 0 - 40 mg/dL   LDL Cholesterol 67 0 - 99 mg/dL    Comment:        Total  Cholesterol/HDL:CHD Risk Coronary Heart Disease Risk Table                     Men   Women  1/2 Average Risk   3.4   3.3  Average Risk       5.0   4.4  2 X Average Risk   9.6   7.1  3 X Average Risk  23.4   11.0        Use the calculated Patient Ratio above and the CHD Risk Table to determine the patient's CHD Risk.        ATP III CLASSIFICATION (LDL):  <100     mg/dL   Optimal  528-413  mg/dL   Near or Above                    Optimal  130-159  mg/dL   Borderline  244-010  mg/dL   High  >272     mg/dL   Very High Performed at [facility]   TSH     Status: None   Collection Time: 12/10/15  6:30 AM  Result Value Ref Range   TSH 1.744 0.350 - 4.500 uIU/mL    Comment: Performed at [facility]    Physical Findings: AIMS: Facial and Oral Movements Muscles of Facial Expression: None, normal Lips and Perioral Area: None, normal Jaw: None, normal Tongue: None, normal,Extremity Movements Upper (arms, wrists, hands, fingers): None, normal Lower (legs, knees, ankles, toes): None, normal, Trunk Movements Neck, shoulders, hips: None, normal, Overall Severity Severity of abnormal movements (highest score from questions above): None, normal Incapacitation due to abnormal movements: None, normal Patient's awareness of abnormal movements (rate only patient's report): No Awareness, Dental Status Current problems with teeth and/or dentures?: No Does patient usually wear dentures?: No  CIWA:  CIWA-Ar Total: 14 COWS:  COWS Total Score: 5  Musculoskeletal: Strength & Muscle Tone: within normal limits Gait & Station: normal Patient leans: N/A  Psychiatric Specialty Exam: Review of Systems  Psychiatric/Behavioral: Positive for depression. The patient is nervous/anxious.   All other systems reviewed and are negative.   Blood pressure 115/78, pulse 84, temperature 98.2 F (36.8 C), temperature source Oral, resp. rate 18, height  (1.803 m), weight 138.801  kg (306 lb), SpO2 100 %.Body mass index is 42.7 kg/(m^2).  General Appearance: Disheveled  Eye Contact::  Fair  Speech:  Clear and Coherent  Volume:  Increased  Mood:  Angry, Anxious and Depressed  Affect:  Labile  Thought Process:  Logical  Orientation:  Full (Time, Place, and Person)  Thought Content:  Delusions and Rumination  Suicidal Thoughts:  No  Homicidal Thoughts:  No  Memory:  Immediate;   Fair Recent;   Fair Remote;   Fair  Judgement:  Impaired  Insight:  Shallow  Psychomotor Activity:  Increased and Restlessness  Concentration:  Poor  Recall:  [employer] of Knowledge:Fair  Language: Fair  Akathisia:  No  Handed:  Right  AIMS (if indicated):     Assets:  Desire for Improvement  ADL's:  Intact  Cognition: WNL  Sleep:  Number of Hours: 4.75   Treatment Plan Summary:George [name] is a 25 y.o. AA male who has a hx of Schizophrenia ,who presented with family for bizarre behavior. Pt continues to be disruptive, labile on the unit - will continue treatment. Daily contact with patient to assess and evaluate symptoms and progress in treatment and Medication management  Tolerating Zyprexa 5 mg po bid for psychosis/mood sx. Cont. Cogentin 0.5 mg po bid for PES. Cont. Lamictal 25 mg po daily for mood sx. Cont. Trazodone 50 mg po qhs for sleep. Will continue to monitor vitals ,medication compliance and treatment side effects while patient is here.  Will monitor for medical issues as well as call consult as needed.  Reviewed labs - TSH- wnl , lipid panel- wnl, Hba1c 6.2, PL 28.2, EKG for qtc- wnl, NSR CSW will start working on disposition.  Patient to participate in therapeutic milieu .   Lindwood Qua, NP -[facility] 12/10/2015, 10:25 PM  I agreed with the findings, treatment and disposition plan of this patient.  Kathryne Sharper, MD

## 2015-12-10 NOTE — Progress Notes (Signed)
[facility] MD Progress Note  12/10/2015 12:57 PM CICERO NOY  MRN:  [id number] Subjective:  Patient states " I have to learn to keep calm.'  Objective: TELLY JAWAD is a 25 y.o. AA male who is single , employed ,lives with his parents in [place] , has a hx of Schizophrenia , was initially brought in voluntarily to the [facility] by his parents for bizarre behavior at work.  Patient seen and chart reviewed.Discussed patient with treatment team.  Pt today is seen as vaguely irritable , seen as loud often. Pt however was able to cooperate with the evaluation. Pt denies any psychosis, but continues to have mood lability. Pt reports sleep as good after he took his medications last night. Per staff - pt continues to be labile, loud on the unit - requiring PRN zyprexa. Pt also has been refusing to take the geodon prescribed. Discussed with pt about changing his medication to scheduled dose of Zyprexa- pt agrees with plan.    Principal Problem: Schizophrenia (HCC) Diagnosis:   Patient Active Problem List   Diagnosis Date Noted  . Schizophrenia (HCC) [F20.9] 12/08/2015   Total Time spent with patient: 30 minutes  Past Psychiatric History: Pt with hx of schizophrenia , follows up with [facility]. Pt has been noncompliant on his psychotropic medications. Pt has had previous inpatient hospitalizations. Denies hx of suicide attempts.   Past Medical History:  Past Medical History  Diagnosis Date  . Schizophrenia (HCC)   . Depression   . ADHD (attention deficit hyperactivity disorder)   . Sleep disorder 04/30/2014   Family History:  Family History  Problem Relation Age of Onset  . Diabetes Maternal Grandmother   . Alzheimer's disease Paternal Grandmother   . Heart failure Paternal Grandmother   . Heart failure Paternal Grandfather   . Heart failure Maternal Grandfather   . Cancer Father    Family Psychiatric  History: Pt denies Social History: Pt is single , works at a [employer]. Pt lives with his parents. History  Alcohol Use No     History  Drug Use No    Social History   Social History  . Marital Status: Single    Spouse Name: N/A  . Number of Children: N/A  . Years of Education: N/A   Social History Main Topics  . Smoking status: Never Smoker   . Smokeless tobacco: None  . Alcohol Use: No  . Drug Use: No  . Sexual Activity: Not Asked     Comment: Unknown   Other Topics Concern  . None   Social History Narrative   Additional Social History:                         Sleep: Fair  Appetite:  Fair  Current Medications: Current Facility-Administered Medications  Medication Dose Route Frequency Provider Last Rate Last Dose  . acetaminophen (TYLENOL) tablet 650 mg  650 mg Oral Q6H PRN Roy Snuffer, MD      . benztropine (COGENTIN) tablet 0.5 mg  0.5 mg Oral BH-qamhs Brittiany Wiehe, MD      . hydrOXYzine (ATARAX/VISTARIL) tablet 25 mg  25 mg Oral TID PRN Jomarie Longs, MD   25 mg at 12/09/15 2210  . lamoTRIgine (LAMICTAL) tablet 25 mg  25 mg Oral Daily Karel Turpen, MD      . LORazepam (ATIVAN) tablet 1 mg  1 mg Oral Q6H PRN Jomarie Longs, MD  Or  . LORazepam (ATIVAN) injection 1 mg  1 mg Intramuscular Q6H PRN Jona Zappone, MD      . OLANZapine zydis (ZYPREXA) disintegrating tablet 5 mg  5 mg Oral BH-qamhs Ithzel Fedorchak, MD      . QUEtiapine (SEROQUEL) tablet 50 mg  50 mg Oral TID PRN Jomarie Longs, MD      . traZODone (DESYREL) tablet 50 mg  50 mg Oral QHS,MR X 1 Kerry Hough, PA-C   50 mg at 12/09/15 2339    Lab Results:  Results for orders placed or performed during the hospital encounter of 12/08/15 (from the past 48 hour(s))  Lipid panel     Status: Abnormal   Collection Time: 12/10/15  6:30 AM  Result Value Ref Range   Cholesterol 119 0 - 200 mg/dL   Triglycerides 91 <161 mg/dL   HDL 34 (L) >09 mg/dL   Total CHOL/HDL Ratio 3.5 RATIO   VLDL 18 0 - 40 mg/dL   LDL Cholesterol 67 0 - 99 mg/dL     Comment:        Total Cholesterol/HDL:CHD Risk Coronary Heart Disease Risk Table                     Men   Women  1/2 Average Risk   3.4   3.3  Average Risk       5.0   4.4  2 X Average Risk   9.6   7.1  3 X Average Risk  23.4   11.0        Use the calculated Patient Ratio above and the CHD Risk Table to determine the patient's CHD Risk.        ATP III CLASSIFICATION (LDL):  <100     mg/dL   Optimal  604-540  mg/dL   Near or Above                    Optimal  130-159  mg/dL   Borderline  981-191  mg/dL   High  >478     mg/dL   Very High Performed at [facility]   TSH     Status: None   Collection Time: 12/10/15  6:30 AM  Result Value Ref Range   TSH 1.744 0.350 - 4.500 uIU/mL    Comment: Performed at [facility]    Physical Findings: AIMS: Facial and Oral Movements Muscles of Facial Expression: None, normal Lips and Perioral Area: None, normal Jaw: None, normal Tongue: None, normal,Extremity Movements Upper (arms, wrists, hands, fingers): None, normal Lower (legs, knees, ankles, toes): None, normal, Trunk Movements Neck, shoulders, hips: None, normal, Overall Severity Severity of abnormal movements (highest score from questions above): None, normal Incapacitation due to abnormal movements: None, normal Patient's awareness of abnormal movements (rate only patient's report): No Awareness, Dental Status Current problems with teeth and/or dentures?: No Does patient usually wear dentures?: No  CIWA:  CIWA-Ar Total: 14 COWS:  COWS Total Score: 5  Musculoskeletal: Strength & Muscle Tone: within normal limits Gait & Station: normal Patient leans: N/A  Psychiatric Specialty Exam: Review of Systems  Psychiatric/Behavioral: Positive for depression. The patient is nervous/anxious.   All other systems reviewed and are negative.   Blood pressure 115/78, pulse 84, temperature 98.2 F (36.8 C), temperature source Oral, resp. rate 18, height   (1.803 m), weight 138.801 kg (306 lb), SpO2 100 %.Body mass index is 42.7 kg/(m^2).  General Appearance: Disheveled  Eye Contact::  Fair  Speech:  Clear and Coherent  Volume:  Increased  Mood:  Angry, Anxious and Depressed  Affect:  Labile  Thought Process:  Logical  Orientation:  Full (Time, Place, and Person)  Thought Content:  Delusions and Rumination  Suicidal Thoughts:  No  Homicidal Thoughts:  No  Memory:  Immediate;   Fair Recent;   Fair Remote;   Fair  Judgement:  Impaired  Insight:  Shallow  Psychomotor Activity:  Increased and Restlessness  Concentration:  Poor  Recall:  [employer] of Knowledge:Fair  Language: Fair  Akathisia:  No  Handed:  Right  AIMS (if indicated):     Assets:  Desire for Improvement  ADL's:  Intact  Cognition: WNL  Sleep:  Number of Hours: 4.75   Treatment Plan Summary:Ellis A Krahenbuhl is a 25 y.o. AA male who has a hx of Schizophrenia ,who presented with family for bizarre behavior. Pt continues to be disruptive, labile on the unit - will continue treatment. Daily contact with patient to assess and evaluate symptoms and progress in treatment and Medication management  Will DC Geodon, start a trial of Zyprexa 5 mg po bid for psychosis/mood sx. Will add Cogentin 0.5 mg po bid for PES. Will add Lamictal 25 mg po daily for mood sx. Will continue Trazodone 50 mg po qhs for sleep. Will continue to monitor vitals ,medication compliance and treatment side effects while patient is here.  Will monitor for medical issues as well as call consult as needed.  Reviewed labs - TSH- wnl , lipid panel- wnl , pending Hba1c, PL , EKG for qtc- wnl  CSW will start working on disposition.  Patient to participate in therapeutic milieu .      Srihitha Tagliaferri, MD 12/10/2015, 12:57 PM

## 2015-12-10 NOTE — Progress Notes (Signed)
D: Pt was mainly isolated and withdrawn to his room: in bed resting bwith eye closed. Pt at the time of assessment endorses moderate anxiety and depression; states, "They are still there but am learning to handle them better." Pt is very flat. Pt however, denies pain, SI, HI and AVH. Pt remained calm and nonviolent through the shift assessment. A: Medications offered as prescribed.  Support, encouragement, and safe environment provided.  15-minute safety checks continue. R: Pt was med compliant.  Pt did attended group. Safety checks continue.

## 2015-12-10 NOTE — [facility] Group Notes (Signed)
[facility] LCSW Aftercare Discharge Planning Group Note   12/10/2015 10:43 AM  Participation Quality:  Engaged  Mood/Affect:  Flat  Depression Rating:  denies  Anxiety Rating:  denies  Thoughts of Suicide:  No Will you contract for safety?   NA  Current AVH:  denies  Plan for Discharge/Comments:  "I didn't sleep well, so I took a shower to help relax me. It also helps me calm down.  That's what I'm focusing on.  I get angry whenever I talk to my parents, so I don't think I should visit with them."  Calm in group.  No outbursts.  States he is willing to work with the Dr on figuring out meds.  Transportation Means: family  Supports: family  [place], George [name]

## 2015-12-10 NOTE — [facility] Group Notes (Signed)
[facility] LCSW Group Therapy   12/10/2015 1:19 PM  Type of Therapy: Group Therapy  Participation Level:  Active  Participation Quality:  Attentive  Affect:  Flat  Cognitive:  Oriented  Insight:  Limited  Engagement in Therapy:  Engaged  Modes of Intervention:  Discussion and Socialization  Summary of Progress/Problems: Chaplain was here to lead a group on themes of hope and/or courage.  "The biggest thing for me is self-control. I need to learn how to control myself. I know the right things to do, I just need to actually do it". Pt also talked about how important it is to have hope for yourself. "People can hope for you all they want but it's not going to do any good if you can't believe in yourself."  George [name] 12/10/2015 1:19 PM

## 2015-12-10 NOTE — Progress Notes (Signed)
D Will is out in the hall this AM. HE is confused about the date and time. HE did not want to take his scheduled geodon ( he said " that medicine doesn't help me..  zyprexa helps me" and was given a prn dose of zyprexa, for c/o anxiety. Later this morning, he was seen talking on the hall phone and he began to yell and curse at the person he was speaking with . This nurse asked him to hang up the phone and stood with him...while he went over what he was arguing about. His thinking remains disorganized. His speech can be tangential. He is paranoid and guarded. He did complete his daily assessment and on it he wrote he deneid SI and he rated his depression, hopelessness and anxiety " 0/0/1", respectively.    A Withdrawal cont without diff.    R Safety in place.

## 2015-12-11 LAB — HEMOGLOBIN A1C
HEMOGLOBIN A1C: 6.2 % — AB (ref 4.8–5.6)
MEAN PLASMA GLUCOSE: 131 mg/dL

## 2015-12-11 LAB — PROLACTIN: PROLACTIN: 28.2 ng/mL — AB (ref 4.0–15.2)

## 2015-12-11 MED ORDER — MAGNESIUM CITRATE PO SOLN
1.0000 | Freq: Once | ORAL | Status: AC
  Administered 2015-12-11: 1 via ORAL

## 2015-12-11 MED ORDER — LAMOTRIGINE 25 MG PO TABS
50.0000 mg | ORAL_TABLET | Freq: Every day | ORAL | Status: DC
  Administered 2015-12-12 – 2015-12-13 (×2): 50 mg via ORAL
  Filled 2015-12-11 (×4): qty 2
  Filled 2015-12-11: qty 14

## 2015-12-11 MED ORDER — OLANZAPINE 5 MG PO TBDP
5.0000 mg | ORAL_TABLET | Freq: Four times a day (QID) | ORAL | Status: DC | PRN
  Administered 2015-12-11: 5 mg via ORAL

## 2015-12-11 MED ORDER — ZIPRASIDONE MESYLATE 20 MG IM SOLR: 20.0000 mg | INTRAMUSCULAR | Status: DC | PRN

## 2015-12-11 MED ORDER — OLANZAPINE 5 MG PO TABS
5.0000 mg | ORAL_TABLET | Freq: Three times a day (TID) | ORAL | Status: DC
  Administered 2015-12-11 – 2015-12-13 (×7): 5 mg via ORAL
  Filled 2015-12-11: qty 1
  Filled 2015-12-11 (×2): qty 21
  Filled 2015-12-11 (×6): qty 1
  Filled 2015-12-11: qty 21
  Filled 2015-12-11: qty 2
  Filled 2015-12-11 (×2): qty 1
  Filled 2015-12-11: qty 2

## 2015-12-11 MED ORDER — OLANZAPINE 5 MG PO TBDP: 5.0000 mg | ORAL_TABLET | Freq: Three times a day (TID) | ORAL | Status: DC | PRN

## 2015-12-11 MED ORDER — OLANZAPINE 5 MG PO TBDP: 5.0000 mg | ORAL_TABLET | Freq: Four times a day (QID) | ORAL | Status: DC | PRN

## 2015-12-11 MED ORDER — LORAZEPAM 1 MG PO TABS: 1.0000 mg | ORAL_TABLET | ORAL | Status: DC | PRN

## 2015-12-11 NOTE — [facility] Group Notes (Signed)
[facility] Group Notes:  (Clinical Social Work)  12/11/2015  11:15-12:00PM  Summary of Progress/Problems:   Today's process group involved patients discussing their feelings related to being hospitalized, as well as how they can use their present feelings to make a plan for how to stay out of the hospital.  There was an emphasis on setting boundaries, making "I" statements, going to AA/support groups/therapy/doctor's appointments. The patient expressed his primary feeling about being hospitalized is mixed, both positive about getting help and feeling validated by being around others who have similar experiences to his, as well as negative because he wishes he did not have to be here.  He talked about setting boundaries as one of the main things he needs to do when he gets out of the hospital, and we discussed positive ways of doing that.  Type of Therapy:  Group Therapy - Process  Participation Level:  Active  Participation Quality:  Attentive and Sharing  Affect:  Blunted  Cognitive:  Alert at first but became drowsy, fell asleep  Insight:  Appropriate  Engagement in Therapy:  Developing/Improving  Modes of Intervention:  Exploration, Discussion  Ambrose Mantle, LCSW 12/11/2015, 12:50 PM

## 2015-12-11 NOTE — Progress Notes (Signed)
George [name] continues to struggle with his intense feelings, feelings of paranoia, his mistrust of others and his general hospitalized state. He cont to have diff accepting that he has been involuntarily committed. He has periodically become argumentative and threatening " you can't hold me against my [name] and I'm going to get [name] here..". He has taken all meds offered him. He attended his groups, is mindful of the other patients and was given mag citrate, for his c/o constipation. A HE completed his daily assessment and on it he wrote he denied SI and he rated his depression, hopelessness and anxiety " 0/0/2" , respectively. R Safety is in place.

## 2015-12-12 NOTE — [facility] Group Notes (Addendum)
[facility] Group Notes:  (Clinical Social Work)  12/12/2015  [facility] Group Notes:  (Clinical Social Work)  12/12/2015  11:00AM-12:00PM  Summary of Progress/Problems:  The main focus of today's process group was to listen to a variety of genres of music and to identify that different types of music provoke different responses.  The patient then was able to identify personally what was soothing for them, as well as energizing.   The patient expressed understanding of concepts, as well as knowledge of how each type of music affected him and how this can be used at home as a wellness/recovery tool.  He tended to talk more about his thoughts/analysis of the songs and was tangential, would have to be redirected often.  He said he felt better at the end of group than at the beginning.  Type of Therapy:  Music Therapy   Participation Level:  Active  Participation Quality:  Attentive and Sharing and Monopolizing and Redirectable  Affect:  Blunted  Cognitive:  Oriented  Insight:  Engaged  Engagement in Therapy:  Engaged  Modes of Intervention:   Activity, Exploration  Ambrose Mantle, LCSW 12/12/2015

## 2015-12-12 NOTE — Progress Notes (Signed)
[facility] MD Progress Note  12/12/2015 10:32 AM LA[place] CINQUEMANI  MRN:  [id number] Subjective:  Patient states " I'm sleeping better.  I like new medication."  Objective: LATRAVIS GRINE is a 25 y.o. AA male who is single , employed ,lives with his parents in [place] , has a hx of Schizophrenia , was initially brought in voluntarily to the [facility] by his parents for bizarre behavior at work.  Patient seen and chart reviewed.Discussed patient with treatment team.  He is taking Zyprexa and he likes because he is more calm and sleep better.  Though he remains at times paranoid and he continued to endorse hallucination but also reported it is less intense from the past.  He sleeping better.  He is going to the groups.  He seen sometime intrusive restless and going to other patient's room but not disruptive or aggressive.  He denies any side effects of medication.  Principal Problem: Schizophrenia (HCC) Diagnosis:   Patient Active Problem List   Diagnosis Date Noted  . Schizophrenia (HCC) [F20.9] 12/08/2015   Total Time spent with patient: 30 minutes  Past Psychiatric History: Pt with hx of schizophrenia , follows up with [facility]. Pt has been noncompliant on his psychotropic medications. Pt has had previous inpatient hospitalizations. Denies hx of suicide attempts.   Past Medical History:  Past Medical History  Diagnosis Date  . Schizophrenia (HCC)   . Depression   . ADHD (attention deficit hyperactivity disorder)   . Sleep disorder 04/30/2014   Family History:  Family History  Problem Relation Age of Onset  . Diabetes Maternal Grandmother   . Alzheimer's disease Paternal Grandmother   . Heart failure Paternal Grandmother   . Heart failure Paternal Grandfather   . Heart failure Maternal Grandfather   . Cancer Father    Family Psychiatric  History: Pt denies Social History: Pt is single , works at a [employer]. Pt lives with his parents. History  Alcohol Use No     History   Drug Use No    Social History   Social History  . Marital Status: Single    Spouse Name: N/A  . Number of Children: N/A  . Years of Education: N/A   Social History Main Topics  . Smoking status: Never Smoker   . Smokeless tobacco: None  . Alcohol Use: No  . Drug Use: No  . Sexual Activity: Not Asked     Comment: Unknown   Other Topics Concern  . None   Social History Narrative   Additional Social History:   Sleep: Fair  Appetite:  Fair  Current Medications: Current Facility-Administered Medications  Medication Dose Route Frequency Provider Last Rate Last Dose  . acetaminophen (TYLENOL) tablet 650 mg  650 mg Oral Q6H PRN Jomarie Longs, MD      . benztropine (COGENTIN) tablet 0.5 mg  0.5 mg Oral BH-qamhs Saramma Eappen, MD   0.5 mg at 12/12/15 1324  . hydrOXYzine (ATARAX/VISTARIL) tablet 25 mg  25 mg Oral TID PRN Jomarie Longs, MD   25 mg at 12/11/15 2021  . lamoTRIgine (LAMICTAL) tablet 50 mg  50 mg Oral Daily Adonis Brook, NP   50 mg at 12/12/15 4010  . LORazepam (ATIVAN) tablet 1 mg  1 mg Oral Q6H PRN Jomarie Longs, MD   1 mg at 12/11/15 1710   Or  . LORazepam (ATIVAN) injection 1 mg  1 mg Intramuscular Q6H PRN Jomarie Longs, MD      . OLANZapine (  ZYPREXA) tablet 5 mg  5 mg Oral TID AC Adonis Brook, NP   5 mg at 12/12/15 [phone]  . OLANZapine zydis (ZYPREXA) disintegrating tablet 5 mg  5 mg Oral Q6H PRN Adonis Brook, NP   5 mg at 12/11/15 1709  . traZODone (DESYREL) tablet 50 mg  50 mg Oral QHS,MR X 1 Kerry Hough, PA-C   50 mg at 12/11/15 2117    Lab Results:  No results found for this or any previous visit (from the past 48 hour(s)).  Physical Findings: AIMS: Facial and Oral Movements Muscles of Facial Expression: None, normal Lips and Perioral Area: None, normal Jaw: None, normal Tongue: None, normal,Extremity Movements Upper (arms, wrists, hands, fingers): None, normal Lower (legs, knees, ankles, toes): None, normal, Trunk Movements Neck,  shoulders, hips: None, normal, Overall Severity Severity of abnormal movements (highest score from questions above): None, normal Incapacitation due to abnormal movements: None, normal Patient's awareness of abnormal movements (rate only patient's report): No Awareness, Dental Status Current problems with teeth and/or dentures?: No Does patient usually wear dentures?: No  CIWA:  CIWA-Ar Total: 14 COWS:  COWS Total Score: 5  Musculoskeletal: Strength & Muscle Tone: within normal limits Gait & Station: normal Patient leans: N/A  Psychiatric Specialty Exam: Review of Systems  Skin: Negative for itching and rash.  Neurological: Negative for tremors.  Psychiatric/Behavioral: Positive for depression. The patient is nervous/anxious.   All other systems reviewed and are negative.   Blood pressure 105/62, pulse 87, temperature 97.7 F (36.5 C), temperature source Oral, resp. rate 18, height  (1.803 m), weight 138.801 kg (306 lb), SpO2 100 %.Body mass index is 42.7 kg/(m^2).  General Appearance: Fairly Groomed  Patent attorney::  Fair  Speech:  Clear and Coherent  Volume:  Increased  Mood:  Angry, Anxious and Depressed  Affect:  Labile  Thought Process:  Logical  Orientation:  Full (Time, Place, and Person)  Thought Content:  Delusions and Paranoid Ideation  Suicidal Thoughts:  No  Homicidal Thoughts:  No  Memory:  Immediate;   Fair Recent;   Fair Remote;   Fair  Judgement:  Impaired  Insight:  Shallow  Psychomotor Activity:  Increased  Concentration:  Poor  Recall:  [employer] of Knowledge:Fair  Language: Fair  Akathisia:  No  Handed:  Right  AIMS (if indicated):     Assets:  Desire for Improvement  ADL's:  Intact  Cognition: WNL  Sleep:  Number of Hours: 6.5   Treatment Plan Summary:Facundo A Azbill is a 25 y.o. AA male who has a hx of Schizophrenia ,who presented with family for bizarre behavior. Pt continues to be disruptive, labile on the unit - will continue  treatment. Daily contact with patient to assess and evaluate symptoms and progress in treatment and Medication management  Tolerating Zyprexa 5 mg po 3 times a day for psychosis/mood sx. Cont. Cogentin 0.5 mg po bid for PES. Cont. Lamictal 50 mg po daily for mood sx. he has no rash or itching. Cont. Trazodone 50 mg po qhs for sleep. Will continue to monitor vitals ,medication compliance and treatment side effects while patient is here.  Will monitor for medical issues as well as call consult as needed.  Reviewed labs - TSH- wnl , lipid panel- wnl, Hba1c 6.2, PL 28.2, EKG for qtc- wnl, NSR CSW will start working on disposition.  Patient to participate in therapeutic milieu .   Mamye Bolds T., MD  12/12/2015, 10:32 AM

## 2015-12-12 NOTE — Progress Notes (Signed)
D: Pt denies SI/HI/AVH. Pt is very labile and very upset. Pt does not appear to be thought blocking but very agitated. 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 Pt feels that no one is taking him seriously , because he has a label of Schizophrenia , people are quick to say something is wrong, but not listen to what he is really trying to say. Pt was observed sitting in room crying , then writer had to intervene and talk with pt over 30 minutes to try to console, pt calmed down went to sleep after talk.   A: Pt was offered support and encouragement. Pt was given scheduled medications. Pt was encourage to attend groups. Q 15 minute checks were done for safety.   R: Pt is taking medication.Pt receptive to treatment and safety maintained on unit.

## 2015-12-13 DIAGNOSIS — F200 Paranoid schizophrenia: Secondary | ICD-10-CM | POA: Insufficient documentation

## 2015-12-13 MED ORDER — METFORMIN HCL 500 MG PO TABS
500.0000 mg | ORAL_TABLET | Freq: Every day | ORAL | Status: DC
  Filled 2015-12-13: qty 7
  Filled 2015-12-13: qty 1

## 2015-12-13 MED ORDER — LAMOTRIGINE 25 MG PO TABS: 50.0000 mg | ORAL_TABLET | Freq: Every day | ORAL | Status: DC

## 2015-12-13 MED ORDER — OLANZAPINE 5 MG PO TABS: 5.0000 mg | ORAL_TABLET | Freq: Three times a day (TID) | ORAL | Status: DC

## 2015-12-13 MED ORDER — METFORMIN HCL 500 MG PO TABS: 500.0000 mg | ORAL_TABLET | Freq: Every day | ORAL | Status: DC

## 2015-12-13 MED ORDER — BENZTROPINE MESYLATE 0.5 MG PO TABS: 0.5000 mg | ORAL_TABLET | ORAL | Status: DC

## 2015-12-13 MED ORDER — HYDROXYZINE HCL 25 MG PO TABS: 25.0000 mg | ORAL_TABLET | Freq: Three times a day (TID) | ORAL | Status: DC | PRN

## 2015-12-13 MED ORDER — TRAZODONE HCL 50 MG PO TABS: 50.0000 mg | ORAL_TABLET | Freq: Every evening | ORAL | Status: DC | PRN

## 2015-12-13 NOTE — Progress Notes (Signed)
D: Pt d/c home as per MD's orders. Pt was picked up by his parents in the lobby. Pt denied SI, HI, AVH and pain at time of assessment. Presents with animated affect and congruent mood. Pt exhibits decrease paranoia and is verbally redirectable. Pt compliant with medications as ordered when offered. Cooperative with unit routines. Attended scheduled groups.  A: D/C instructions reviewed with patients including medications and prescriptions. All medications administered as ordered. All belonging in locker 22 returned to pt prior to d/c. Safety maintained on Q 15 minutes checks as ordered without outburst or self harm gestures to note at this time.  R: Pt receptive to care. Verbalized understanding of d/c instructions when reviewed. Pt signed belonging sheet in agreement with items received. Denies adverse drug reactions when assessed. Safety maintained on and off unit till time of d/c.

## 2015-12-13 NOTE — Progress Notes (Signed)
D: Pt is very labile; however, his attitude is much better than what it has been. Pt denies any depression, anxiety, pain, SI, and HI; he states, "I think I am ready to be d/c" Pt also denies auditory and visual hallucinations. Pt could also be paranoid and suspicious and illogical at times; "what does my Cogentin come in white?" Pt remained relatively calm and cooperative through his shift assessment.  A: Medications offered as prescribed.  Support, encouragement, and safe environment provided.  15-minute safety checks continue. R: Pt was med compliant. Safety checks continue

## 2015-12-13 NOTE — Discharge Summary (Signed)
Physician Discharge Summary Note  Patient:  George [name] is an 25 y.o., male MRN:  [id number] DOB:  [birth year] Patient phone:  [phone] (home)  Patient address:   [address] [place] [address],  Total Time spent with patient: 30 minutes  Date of Admission:  12/08/2015 Date of Discharge: 12/13/2015  Reason for Admission:  Bizarre behavior  Principal Problem: Schizophrenia [facility]) Discharge Diagnoses: Patient Active Problem List   Diagnosis Date Noted  . Schizophrenia (HCC) [F20.9] 12/08/2015    Priority: High  . Paranoid schizophrenia (HCC) [F20.0]     Past Psychiatric History:  See above noted  Past Medical History:  Past Medical History  Diagnosis Date  . Schizophrenia (HCC)   . Depression   . ADHD (attention deficit hyperactivity disorder)   . 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 No pertinent past surgical history. Family History:  Family History  Problem Relation Age of Onset  . Diabetes Maternal Grandmother   . Alzheimer's disease Paternal Grandmother   . Heart failure Paternal Grandmother   . Heart failure Paternal Grandfather   . Heart failure Maternal Grandfather   . Cancer Father    Family Psychiatric  History:  denies Social History:  History  Alcohol Use No     History  Drug Use No    Social History   Social History  . Marital Status: Single    Spouse Name: N/A  . Number of Children: N/A  . Years of Education: N/A   Social History Main Topics  . Smoking status: Never Smoker   . Smokeless tobacco: None  . Alcohol Use: No  . Drug Use: No  . Sexual Activity: Not Asked     Comment: Unknown   Other Topics Concern  . None   Social History Narrative    Hospital Course:  George [name], 25 y.o. male was initially brought in voluntarily to the [facility] by his parents. 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  George [name] was admitted for Schizophrenia [facility]) and crisis  management.  He was treated with the following medications Zyprexa 5 mg for psychosis/mood sx, Cogentin 0.5 mg for EPS, Lamictal 50 mg for mood sx. he has no rash or itching, Trazodone 50 mg po qhs for sleep.George [name] was discharged with current medication and was instructed on how to take medications as prescribed; (details listed below under Medication List).  Medical problems were identified and treated as needed.  Home medications were restarted as appropriate.  Improvement was monitored by observation and George [name] daily report of symptom reduction.  Emotional and mental status was monitored by daily self-inventory reports completed by George [name] and clinical staff.  He was complaint taking Zyprexa and stated that his sleep improved and made him more calm.   He denied any side effects of medication.  He attended groups. He was observed to be less.        George [name] was evaluated by the treatment team for stability and plans for continued recovery upon discharge.  George [name] motivation was an integral factor for scheduling further treatment.  Employment, transportation, bed availability, health status, family support, and any pending legal issues were also considered during his hospital stay.  He was offered further treatment options upon discharge including but not limited to Residential, Intensive Outpatient, and Outpatient treatment.  George [name] will follow up with the services as listed below  under Follow Up Information.     Upon completion of this admission the George [name] was both mentally and medically stable for discharge denying suicidal/homicidal ideation, auditory/visual/tactile hallucinations, delusional thoughts and paranoia.     Physical Findings: AIMS: Facial and Oral Movements Muscles of Facial Expression: None, normal Lips and Perioral Area: None, normal Jaw: None, normal Tongue: None, normal,Extremity Movements Upper (arms, wrists,  hands, fingers): None, normal Lower (legs, knees, ankles, toes): None, normal, Trunk Movements Neck, shoulders, hips: None, normal, Overall Severity Severity of abnormal movements (highest score from questions above): None, normal Incapacitation due to abnormal movements: None, normal Patient's awareness of abnormal movements (rate only patient's report): No Awareness, Dental Status Current problems with teeth and/or dentures?: No Does patient usually wear dentures?: No  CIWA:  CIWA-Ar Total: 14 COWS:  COWS Total Score: 5  Musculoskeletal: Strength & Muscle Tone: within normal limits Gait & Station: normal Patient leans: N/A  Psychiatric Specialty Exam: Review of Systems  All other systems reviewed and are negative.   Blood pressure 102/52, pulse 85, temperature 98.2 F (36.8 C), temperature source Oral, resp. rate 16, height 5\' 11"  (1.803 m), weight 138.801 kg (306 lb), SpO2 100 %.Body mass index is 42.7 kg/(m^2).  Have you used any form of tobacco in the last 30 days? (Cigarettes, Smokeless Tobacco, Cigars, and/or Pipes): Patient Refused Screening (Pt increasingly looking at Nurse and Tech in "staring" fashion, then after long pauses and rhythmic hard tapping of pen on table making statements such as "let me look at this again; I m not sure, I need to think".)  Has this patient used any form of tobacco in the last 30 days? (Cigarettes, Smokeless Tobacco, Cigars, and/or Pipes) Yes, N/A  Metabolic Disorder Labs:  Lab Results  Component Value Date   HGBA1C 6.2* 12/10/2015   MPG 131 12/10/2015   Lab Results  Component Value Date   PROLACTIN 28.2* 12/10/2015   Lab Results  Component Value Date   CHOL 119 12/10/2015   TRIG 91 12/10/2015   HDL 34* 12/10/2015   CHOLHDL 3.5 12/10/2015   VLDL 18 12/10/2015   LDLCALC 67 12/10/2015    See Psychiatric Specialty Exam and Suicide Risk Assessment completed by Attending Physician prior to discharge.  Discharge destination:  Home  Is  patient on multiple antipsychotic therapies at discharge:  No   Has Patient had three or more failed trials of antipsychotic monotherapy by history:  No  Recommended Plan for Multiple Antipsychotic Therapies: NA     Medication List    STOP taking these medications        ARIPiprazole 20 MG tablet  Commonly known as:  ABILIFY     buPROPion 150 MG 24 hr tablet  Commonly known as:  WELLBUTRIN XL     sertraline 100 MG tablet  Commonly known as:  ZOLOFT      TAKE these medications      Indication   benztropine 0.5 MG tablet  Commonly known as:  COGENTIN  Take 1 tablet (0.5 mg total) by mouth 2 (two) times daily in the am and at bedtime..   Indication:  Extrapyramidal Reaction caused by Medications     hydrOXYzine 25 MG tablet  Commonly known as:  ATARAX/VISTARIL  Take 1 tablet (25 mg total) by mouth 3 (three) times daily as needed for anxiety.   Indication:  Anxiety Neurosis     lamoTRIgine 25 MG tablet  Commonly known as:  LAMICTAL  Take 2 tablets (50 mg total)  by mouth daily.   Indication:  mood stabilization     metFORMIN 500 MG tablet  Commonly known as:  GLUCOPHAGE  Take 1 tablet (500 mg total) by mouth daily with breakfast.  Start taking on:  12/14/2015   Indication:  Antipsychotic Therapy-Induced Weight Gain     OLANZapine 5 MG tablet  Commonly known as:  ZYPREXA  Take 1 tablet (5 mg total) by mouth 3 (three) times daily before meals.   Indication:  mood stabilization     traZODone 50 MG tablet  Commonly known as:  DESYREL  Take 1 tablet (50 mg total) by mouth at bedtime and may repeat dose one time if needed.   Indication:  Aggressive Behavior           Follow-up Information    Follow up with [facility] On 12/16/2015.   Specialty:  Behavioral Health   Why:  Hospital discharge follow up appointment w Anne Fu on 12/16/15 at 10 AM.  Please call to cancel or reschedule if necessary.     Contact information:   [address] [place] [address] [phone]       Schedule an appointment as soon as possible for a visit with [facility].   Why:  please follow up with clinic.  You were started on Metformin for bllod glucose 135 and hemoglobin A1C 6.2.     Contact information:   [address] [place] [address] [phone]      Follow-up recommendations:  Activity:   as tol Diet:  as tol  Comments:  1.  Take all your medications as prescribed.              2.  Report any adverse side effects to outpatient provider.                       3.  Patient instructed to not use alcohol or illegal drugs while on prescription medicines.            4.  In the event of worsening symptoms, instructed patient to call 911, the crisis hotline or go to nearest emergency room for evaluation of symptoms.  Signed: Velna Hatchet May Jaime Grizzell, NP-BC 12/13/2015, 3:29 PM

## 2015-12-13 NOTE — Progress Notes (Addendum)
  [facility] Adult Case Management Discharge Plan :  Will you be returning to the same living situation after discharge:  Yes,  w parents At discharge, do you have transportation home?: Yes,  w parents will transport in afternoon Do you have the ability to pay for your medications: Yes,  has insurance; however, complains of high deductible policy and wants Medicaid. CSW explained Medicaid rules and pt voiced understanding.    Release of information consent forms signed and turned in to Medical Records.  Patient to Follow up at: Follow-up Information    Follow up with [facility] On 12/16/2015.   Specialty:  Behavioral Health   Why:  Hospital discharge follow up appointment w Anavi Branscum Fu on 12/16/15 at 10 AM.  Please call to cancel or reschedule if necessary.     Contact information:   [address] [place] [address] [phone]       Schedule an appointment as soon as possible for a visit with [facility].   Why:  please follow up with clinic.  You were started on Metformin for bllod glucose 135 and hemoglobin A1C 6.2.     Contact information:   [address] [place] [address] [phone]      Next level of care provider has access to [facility] Link:no  Safety Planning and Suicide Prevention discussed: Yes,  reviewed w patient in group, brochure provided  Have you used any form of tobacco in the last 30 days? (Cigarettes, Smokeless Tobacco, Cigars, and/or Pipes): Patient Refused Screening (Pt increasingly looking at Nurse and Tech in "staring" fashion, then after long pauses and rhythmic hard tapping of pen on table making statements such as "let me look at this again; I m not sure, I need to think".)  Has patient been referred to the Quitline?: Patient refused referral  Patient has been referred for addiction treatment: N/A, patient denies difficulties w substance use.   Sallee Lange 12/13/2015, 11:54 AM

## 2015-12-13 NOTE — BHH Suicide Risk Assessment (Signed)
[facility] Discharge Suicide Risk Assessment   Principal Problem: Schizophrenia [facility]) Discharge Diagnoses:  Patient Active Problem List   Diagnosis Date Noted  . Schizophrenia (HCC) [F20.9] 12/08/2015    Total Time spent with patient: 30 minutes  Musculoskeletal: Strength & Muscle Tone: within normal limits Gait & Station: normal Patient leans: N/A  Psychiatric Specialty Exam: Review of Systems  Psychiatric/Behavioral: Negative for depression and hallucinations. The patient is not nervous/anxious.   All other systems reviewed and are negative.   Blood pressure 102/52, pulse 85, temperature 98.2 F (36.8 C), temperature source Oral, resp. rate 16, height  (1.803 m), weight 138.801 kg (306 lb), SpO2 100 %.Body mass index is 42.7 kg/(m^2).  General Appearance: Casual  Eye Contact::  Fair  Speech:  Clear and Coherent409  Volume:  Normal  Mood:  Euthymic  Affect:  Congruent  Thought Process:  Coherent  Orientation:  Full (Time, Place, and Person)  Thought Content:  WDL  Suicidal Thoughts:  No  Homicidal Thoughts:  No  Memory:  Immediate;   Fair Recent;   Fair Remote;   Fair  Judgement:  Fair  Insight:  Fair  Psychomotor Activity:  Normal  Concentration:  Fair  Recall:  [employer] of Knowledge:Fair  Language: Fair  Akathisia:  No  Handed:  Right  AIMS (if indicated):     Assets:  Desire for Improvement  Sleep:  Number of Hours: 5.5  Cognition: WNL  ADL's:  Intact   Mental Status Per Nursing Assessment::   On Admission:     Demographic Factors:  Male  Loss Factors: Decrease in vocational status  Historical Factors: Impulsivity  Risk Reduction Factors:   Positive social support  Continued Clinical Symptoms:  Previous Psychiatric Diagnoses and Treatments  Cognitive Features That Contribute To Risk:  None    Suicide Risk:  Minimal: No identifiable suicidal ideation.  Patients presenting with no risk factors but with morbid ruminations; may be classified  as minimal risk based on the severity of the depressive symptoms  Follow-up Information    Follow up with [facility].   Specialty:  [facility] information:   [address] [place] [address] [phone]       Plan Of Care/Follow-up recommendations:  Activity:  no restrictions Diet:  regular Tests:  Follow up with PMD for elevated Hba1c. Other:  none  Aristotelis Vilardi, MD 12/13/2015, 9:50 AM

## 2015-12-13 NOTE — Tx Team (Addendum)
Interdisciplinary Treatment Plan Update (Adult)  Date:  12/13/2015   Time Reviewed:  11:56 AM   Progress in Treatment: Attending groups: Yes. Participating in groups:  Yes. Taking medication as prescribed:  Yes. Tolerating medication:  Yes. Family/Significant other contact made:  Yes  Parents Patient understands diagnosis:  No  Limited insght Discussing patient identified problems/goals with staff:  Yes, see initial care plan. Medical problems stabilized or resolved:  Yes. Denies suicidal/homicidal ideation: Yes. Issues/concerns per patient self-inventory:  No. Other:  New problem(s) identified:  Discharge Plan or Barriers:  Reason for Continuation of Hospitalization: Medication stabilization Other; describe Paranoia, disorganization  Comments:  George [name] is an 25 y.o. African-American male who was brought voluntarily to the [facility] by his parents after they received a call from his employer today. Per parents, pt's manager called to tell them that pt "was not himself" and "was acting strange." Per pt, he is "confused" about the events at work today but stated that he told a friend, Amy, at work that his father has cancer and pt sts talking to her was comforting. After that, pt sts others came to him asking questions and he ended up in his manager's office. Per parents, pt has been previously diagnosed with schizophrenia, paranoid type. Per mom, pt has been prescribed medications for his mental health diagnoses but, has not taken them for a bout 1 year. Pt was being seen at [facility] for medication management and therapy.Will start a trial of Geodon 20 mg po with supper for psychosis/mood sx. Will start PRN medications as per agitation protocol. Will add Trazodone 50 mg po qhs for sleep.    Estimated length of stay: 4-5 days  New goal(s):  Review of initial/current patient goals per problem list:   Review of initial/current patient goals per problem list:  1. Goal(s):  Patient will participate in aftercare plan   Met: Yes   Target date: 3-5 days post admission date   As evidenced by: Patient will participate within aftercare plan AEB aftercare provider and housing plan at discharge being identified. 12/09/15:  Return home, follow up outpt 2/6:  Pt has appt w [facility] for followup, given information on [place] support groups     5. Goal(s): Patient will demonstrate decreased signs of psychosis  * Met: Yes  * Target date: 3-5 days post admission date  * As evidenced by: Patient will demonstrate decreased frequency of AVH or return to baseline function 12/09/15:  Pt presents with paranoia, though blocking and disorganization.  Has not been taking any psychiatric meds. 2/6/`7:  Pt denies AVH, presents w organized thought process, able to converse, no signs of paranoia, taking medications as prescribed.             Attendees: Patient:  12/13/2015 11:56 AM   Family:   12/13/2015 11:56 AM   Physician:  Ursula Alert, MD 12/13/2015 11:56 AM   Nursing:   Delphina Cahill, RN 12/13/2015 11:56 AM   CSW:    Edwyna Shell, LCSW   12/13/2015 11:56 AM   Other:  12/13/2015 11:56 AM   Other:   12/13/2015 11:56 AM   Other:  Norberto Sorenson, [place] 12/13/2015 11:56 AM   Other:   12/13/2015 11:56 AM   Other:  Norberto Sorenson, [place]  12/13/2015 11:56 AM   Other:  12/13/2015 11:56 AM   Other:  12/13/2015 11:56 AM   Other:  12/13/2015 11:56 AM   Other:  12/13/2015 11:56 AM   Other:  12/13/2015 11:56 AM  Other:   12/13/2015 11:56 AM    Scribe for Treatment Team:   Beverely Pace, 12/13/2015 11:56 AM

## 2016-03-16 ENCOUNTER — Encounter (HOSPITAL_COMMUNITY): Payer: Self-pay | Admitting: Emergency Medicine

## 2016-03-16 ENCOUNTER — Emergency Department (HOSPITAL_COMMUNITY)
Admission: EM | Admit: 2016-03-16 | Discharge: 2016-03-17 | Disposition: A | Discharge status: Home / Self Care | Payer: Self-pay | Attending: Emergency Medicine | Admitting: Emergency Medicine

## 2016-03-16 ENCOUNTER — Emergency Department (HOSPITAL_COMMUNITY): Payer: Self-pay

## 2016-03-16 DIAGNOSIS — Z7984 Long term (current) use of oral hypoglycemic drugs: Secondary | ICD-10-CM | POA: Insufficient documentation

## 2016-03-16 DIAGNOSIS — F329 Major depressive disorder, single episode, unspecified: Secondary | ICD-10-CM | POA: Insufficient documentation

## 2016-03-16 DIAGNOSIS — F209 Schizophrenia, unspecified: Secondary | ICD-10-CM | POA: Diagnosis present

## 2016-03-16 DIAGNOSIS — F200 Paranoid schizophrenia: Secondary | ICD-10-CM | POA: Diagnosis present

## 2016-03-16 DIAGNOSIS — F909 Attention-deficit hyperactivity disorder, unspecified type: Secondary | ICD-10-CM | POA: Insufficient documentation

## 2016-03-16 DIAGNOSIS — Z79899 Other long term (current) drug therapy: Secondary | ICD-10-CM | POA: Insufficient documentation

## 2016-03-16 LAB — COMPREHENSIVE METABOLIC PANEL
ALBUMIN: 4.2 g/dL (ref 3.5–5.0)
ALK PHOS: 47 U/L (ref 38–126)
ALT: 61 U/L (ref 17–63)
ANION GAP: 9 (ref 5–15)
AST: 44 U/L — AB (ref 15–41)
BILIRUBIN TOTAL: 0.7 mg/dL (ref 0.3–1.2)
BUN: 14 mg/dL (ref 6–20)
CALCIUM: 9.6 mg/dL (ref 8.9–10.3)
CO2: 24 mmol/L (ref 22–32)
Chloride: 108 mmol/L (ref 101–111)
Creatinine, Ser: 0.99 mg/dL (ref 0.61–1.24)
GFR calc Af Amer: >60 mL/min (ref >60)
GLUCOSE: 91 mg/dL (ref 65–99)
Potassium: 4.3 mmol/L (ref 3.5–5.1)
Sodium: 141 mmol/L (ref 135–145)
TOTAL PROTEIN: 7.5 g/dL (ref 6.5–8.1)

## 2016-03-16 LAB — CBC WITH DIFFERENTIAL/PLATELET
BASOS ABS: 0 10*3/uL (ref 0.0–0.1)
BASOS PCT: 0 %
EOS PCT: 3 %
Eosinophils Absolute: 0.2 10*3/uL (ref 0.0–0.7)
HCT: 40.6 % (ref 39.0–52.0)
Hemoglobin: 13.1 g/dL (ref 13.0–17.0)
Lymphocytes Relative: 17 %
Lymphs Abs: 1.2 10*3/uL (ref 0.7–4.0)
MCH: 27.1 pg (ref 26.0–34.0)
MCHC: 32.3 g/dL (ref 30.0–36.0)
MCV: 83.9 fL (ref 78.0–100.0)
MONO ABS: 0.8 10*3/uL (ref 0.1–1.0)
Monocytes Relative: 11 %
Neutro Abs: 4.7 10*3/uL (ref 1.7–7.7)
Neutrophils Relative %: 69 %
PLATELETS: 311 10*3/uL (ref 150–400)
RBC: 4.84 MIL/uL (ref 4.22–5.81)
RDW: 14.5 % (ref 11.5–15.5)
WBC: 6.9 10*3/uL (ref 4.0–10.5)

## 2016-03-16 LAB — RAPID URINE DRUG SCREEN, HOSP PERFORMED
Amphetamines: NOT DETECTED
Barbiturates: NOT DETECTED
Benzodiazepines: POSITIVE — AB
COCAINE: NOT DETECTED
OPIATES: NOT DETECTED
Tetrahydrocannabinol: POSITIVE — AB

## 2016-03-16 LAB — ETHANOL

## 2016-03-16 MED ORDER — OLANZAPINE 5 MG PO TABS
15.0000 mg | ORAL_TABLET | Freq: Every day | ORAL | Status: DC
  Administered 2016-03-16: 15 mg via ORAL
  Filled 2016-03-16: qty 1

## 2016-03-16 MED ORDER — LORAZEPAM 1 MG PO TABS
1.0000 mg | ORAL_TABLET | Freq: Once | ORAL | Status: AC
  Administered 2016-03-16: 1 mg via ORAL
  Filled 2016-03-16: qty 1

## 2016-03-16 MED ORDER — METFORMIN HCL 500 MG PO TABS
1000.0000 mg | ORAL_TABLET | Freq: Two times a day (BID) | ORAL | Status: DC
  Administered 2016-03-16 – 2016-03-17 (×2): 1000 mg via ORAL
  Filled 2016-03-16 (×5): qty 2

## 2016-03-16 MED ORDER — ACETAMINOPHEN 325 MG PO TABS
650.0000 mg | ORAL_TABLET | Freq: Four times a day (QID) | ORAL | Status: DC | PRN
  Administered 2016-03-16 – 2016-03-17 (×3): 650 mg via ORAL
  Filled 2016-03-16 (×3): qty 2

## 2016-03-16 MED ORDER — LAMOTRIGINE 25 MG PO TABS
50.0000 mg | ORAL_TABLET | Freq: Every day | ORAL | Status: DC
  Administered 2016-03-16 – 2016-03-17 (×2): 50 mg via ORAL
  Filled 2016-03-16 (×2): qty 2

## 2016-03-16 MED ORDER — TRAZODONE HCL 50 MG PO TABS
50.0000 mg | ORAL_TABLET | Freq: Every evening | ORAL | Status: DC | PRN
  Administered 2016-03-16: 50 mg via ORAL
  Filled 2016-03-16: qty 1

## 2016-03-16 MED ORDER — BENZTROPINE MESYLATE 1 MG PO TABS
0.5000 mg | ORAL_TABLET | ORAL | Status: DC
  Administered 2016-03-16 – 2016-03-17 (×2): 0.5 mg via ORAL
  Filled 2016-03-16 (×2): qty 1

## 2016-03-16 MED ORDER — HYDROXYZINE HCL 10 MG PO TABS
10.0000 mg | ORAL_TABLET | Freq: Three times a day (TID) | ORAL | Status: DC | PRN
  Administered 2016-03-16: 10 mg via ORAL
  Filled 2016-03-16 (×2): qty 1
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
## 2016-03-16 NOTE — ED Notes (Addendum)
Pt oriented to room and unit.  Pt is calm and cooperative.  Alert and oriented.  Denies S/I, H/I, and AVH.  15 minute checks and video monitoring continue.  Pt right eye noted to be extremely red and irritated.
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
## 2016-03-16 NOTE — ED Notes (Signed)
Bed: WBH35 Expected date:  Expected time:  Means of arrival:  Comments: Hold for triage 3 
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
## 2016-03-16 NOTE — ED Notes (Signed)
Patient appears restless, irritable. States that he is ill and was on antibiotics. Continues to complain of pain from [place] in [place].  Encouragement offered.   Q15 safety checks continue.

## 2016-03-17 MED ORDER — HYDROXYZINE HCL 25 MG PO TABS: 25.0000 mg | ORAL_TABLET | Freq: Three times a day (TID) | ORAL | Status: DC | PRN

## 2016-03-17 MED ORDER — METFORMIN HCL 1000 MG PO TABS: 1000.0000 mg | ORAL_TABLET | Freq: Two times a day (BID) | ORAL | Status: DC

## 2016-03-17 MED ORDER — OLANZAPINE 15 MG PO TABS: 15.0000 mg | ORAL_TABLET | Freq: Every day | ORAL | Status: DC

## 2016-03-17 MED ORDER — BENZTROPINE MESYLATE 0.5 MG PO TABS: 0.5000 mg | ORAL_TABLET | ORAL | Status: DC

## 2016-03-17 MED ORDER — TRAZODONE HCL 50 MG PO TABS: 50.0000 mg | ORAL_TABLET | Freq: Every day | ORAL | Status: DC

## 2016-03-17 MED ORDER — LAMOTRIGINE 25 MG PO TABS: 50.0000 mg | ORAL_TABLET | Freq: Every day | ORAL | Status: DC

## 2016-03-17 NOTE — BH Assessment (Addendum)
[facility] Assessment Progress Note  Per Thedore MinsMojeed Akintayo, MD, this pt does not require psychiatric hospitalization at this time.  [facility].  [facility]  Pt's nurse, Kendal Hymendie, has been notified.  Doylene Canninghomas Makhi Muzquiz, MA Triage Specialist [phone]

## 2016-03-17 NOTE — ED Notes (Signed)
Pt discharged ambulatory with father.  Discharge instructions and RX were reviewed with pt.  Resources were given.  All belongings were sent with patient.

## 2016-03-17 NOTE — Discharge Instructions (Addendum)
For your ongoing mental health needs, you are advised to follow up with one of the following providers: ° °     [facility] °     [address] °     [place], [place] [address] °     [phone] °     New and returning patients are seen at their walk-in clinic.  Walk-in hours are Monday - Friday from 8:00 am - 3:00 pm.  Walk-in patients are seen on a first come, first served basis.  Try to arrive as early as possible for he best chance of being seen the same day. ° ° °     [facility] °     [address] °     [place], [place] [address] °     [phone] °     New patients are seen at their walk-in clinic.  Walk-in hours are Monday - Friday from 8:00 am - 12:00 pm, and from 1:00 pm - 3:00 pm.  Walk-in patients are seen on a first come, first served basis, so try to arrive as early as possible for the best chance of being seen the same day.  There is an initial fee of $22.50. °
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
## 2017-01-04 DIAGNOSIS — S82899A Other fracture of unspecified lower leg, initial encounter for closed fracture: Secondary | ICD-10-CM

## 2017-01-04 HISTORY — DX: Other fracture of unspecified lower leg, initial encounter for closed fracture: S82.899A

## 2017-01-05 ENCOUNTER — Encounter (HOSPITAL_COMMUNITY): Payer: Self-pay | Admitting: Emergency Medicine

## 2017-01-05 ENCOUNTER — Emergency Department (HOSPITAL_COMMUNITY)
Admission: EM | Admit: 2017-01-05 | Discharge: 2017-01-06 | Disposition: A | Discharge status: Home / Self Care | Payer: Self-pay | Attending: Emergency Medicine | Admitting: Emergency Medicine

## 2017-01-05 DIAGNOSIS — Z7984 Long term (current) use of oral hypoglycemic drugs: Secondary | ICD-10-CM | POA: Insufficient documentation

## 2017-01-05 DIAGNOSIS — F909 Attention-deficit hyperactivity disorder, unspecified type: Secondary | ICD-10-CM | POA: Insufficient documentation

## 2017-01-05 DIAGNOSIS — Z79899 Other long term (current) drug therapy: Secondary | ICD-10-CM | POA: Insufficient documentation

## 2017-01-05 DIAGNOSIS — Z008 Encounter for other general examination: Secondary | ICD-10-CM

## 2017-01-05 DIAGNOSIS — F419 Anxiety disorder, unspecified: Secondary | ICD-10-CM | POA: Insufficient documentation

## 2017-01-05 DIAGNOSIS — F209 Schizophrenia, unspecified: Secondary | ICD-10-CM | POA: Insufficient documentation

## 2017-01-05 DIAGNOSIS — Z638 Other specified problems related to primary support group: Secondary | ICD-10-CM

## 2017-01-05 LAB — CBC
HCT: 42.3 % (ref 39.0–52.0)
HEMOGLOBIN: 13.9 g/dL (ref 13.0–17.0)
MCH: 27.3 pg (ref 26.0–34.0)
MCHC: 32.9 g/dL (ref 30.0–36.0)
MCV: 82.9 fL (ref 78.0–100.0)
Platelets: 271 10*3/uL (ref 150–400)
RBC: 5.1 MIL/uL (ref 4.22–5.81)
RDW: 14.5 % (ref 11.5–15.5)
WBC: 10 10*3/uL (ref 4.0–10.5)

## 2017-01-05 LAB — COMPREHENSIVE METABOLIC PANEL
ALBUMIN: 4.7 g/dL (ref 3.5–5.0)
ALK PHOS: 53 U/L (ref 38–126)
ALT: 52 U/L (ref 17–63)
ANION GAP: 10 (ref 5–15)
AST: 30 U/L (ref 15–41)
BUN: 18 mg/dL (ref 6–20)
CALCIUM: 9.7 mg/dL (ref 8.9–10.3)
CHLORIDE: 102 mmol/L (ref 101–111)
CO2: 21 mmol/L — AB (ref 22–32)
Creatinine, Ser: 1.16 mg/dL (ref 0.61–1.24)
GFR calc non Af Amer: >60 mL/min (ref >60)
GLUCOSE: 114 mg/dL — AB (ref 65–99)
POTASSIUM: 3.5 mmol/L (ref 3.5–5.1)
SODIUM: 133 mmol/L — AB (ref 135–145)
Total Bilirubin: 0.5 mg/dL (ref 0.3–1.2)
Total Protein: 8.2 g/dL — ABNORMAL HIGH (ref 6.5–8.1)

## 2017-01-05 LAB — ACETAMINOPHEN LEVEL

## 2017-01-05 LAB — SALICYLATE LEVEL: Salicylate Lvl: <7 mg/dL (ref 2.8–30.0)

## 2017-01-05 LAB — ETHANOL: Alcohol, Ethyl (B): <5 mg/dL (ref <5)

## 2017-01-05 MED ORDER — LAMOTRIGINE 25 MG PO TABS: 50.0000 mg | ORAL_TABLET | Freq: Every day | ORAL | Status: DC

## 2017-01-05 MED ORDER — TRAZODONE HCL 50 MG PO TABS
50.0000 mg | ORAL_TABLET | Freq: Every day | ORAL | Status: DC
  Administered 2017-01-05: 50 mg via ORAL
  Filled 2017-01-05: qty 1

## 2017-01-05 MED ORDER — METFORMIN HCL 500 MG PO TABS: 1000.0000 mg | ORAL_TABLET | Freq: Two times a day (BID) | ORAL | Status: DC

## 2017-01-05 MED ORDER — BENZTROPINE MESYLATE 0.5 MG PO TABS: 0.5000 mg | ORAL_TABLET | ORAL | Status: DC

## 2017-01-05 MED ORDER — OLANZAPINE 5 MG PO TABS
15.0000 mg | ORAL_TABLET | Freq: Every day | ORAL | Status: DC
  Administered 2017-01-05: 15 mg via ORAL
  Filled 2017-01-05: qty 1

## 2017-01-05 MED ORDER — HYDROXYZINE HCL 25 MG PO TABS: 25.0000 mg | ORAL_TABLET | Freq: Three times a day (TID) | ORAL | Status: DC | PRN

## 2017-01-05 NOTE — ED Notes (Signed)
Bed: [facility] Expected date:  Expected time:  Means of arrival:  Comments: 

## 2017-01-05 NOTE — ED Notes (Signed)
SBAR Report received from previous nurse. Pt received calm and visible on unit. Pt denies current SI/ HI, A/V H, depression, anxiety, or pain at this time, and appears otherwise stable and free of distress. Pt provided sandwich and drink on admission. Pt reminded of camera surveillance, q 15 min rounds, and rules of the milieu. Will continue to assess.
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
## 2017-01-05 NOTE — ED Notes (Signed)
[id number] Expected date:  Expected time:  Means of arrival:  Comments: Azucena CecilBurton

## 2017-01-05 NOTE — ED Triage Notes (Signed)
Patient aunt states patient is not taking his medications and the only time he calls her is when he has a break down.

## 2017-01-05 NOTE — ED Notes (Addendum)
Pt states anxiety, history of anxiety and current attempt at tobacco cessation. Pt is currently less anxious after taking hydroxyzine. Pt states he is being current with medications, and mother is worried that he is not taking his medications as they don't seem to be having the desired effect. [facility]
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
## 2017-01-05 NOTE — ED Notes (Signed)
TTS assessment in progress. 

## 2017-01-06 MED ORDER — TRAZODONE HCL 50 MG PO TABS: 50.0000 mg | ORAL_TABLET | Freq: Once | ORAL | Status: DC | PRN

## 2017-01-06 NOTE — ED Notes (Signed)
Pt discharged and AVS reviewed, pt signed discharge summery and was escorted off unit by Clinical research associatewriter. Pt was allowed to use phone to call mom.

## 2017-01-06 NOTE — ED Notes (Signed)
Pt advised by MD that she will write up DC papers.
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
## 2017-01-06 NOTE — ED Notes (Signed)
Pt attempting to bait staff questions, and endless needs/wants and consistently being at the desk. Writer using firm boundaries/ parameters with pt, and meeting needs.

## 2017-01-06 NOTE — ED Notes (Addendum)
Pt requesting to leave. Dr. Broadus JohnPfieffer notified and will come assess pt.

## 2017-01-18 ENCOUNTER — Emergency Department (HOSPITAL_COMMUNITY): Payer: Self-pay

## 2017-01-18 ENCOUNTER — Emergency Department (HOSPITAL_COMMUNITY)
Admission: EM | Admit: 2017-01-18 | Discharge: 2017-01-19 | Disposition: A | Discharge status: Home / Self Care | Payer: Self-pay | Attending: Emergency Medicine | Admitting: Emergency Medicine

## 2017-01-18 ENCOUNTER — Encounter (HOSPITAL_COMMUNITY): Payer: Self-pay | Admitting: Emergency Medicine

## 2017-01-18 DIAGNOSIS — Y999 Unspecified external cause status: Secondary | ICD-10-CM | POA: Insufficient documentation

## 2017-01-18 DIAGNOSIS — F909 Attention-deficit hyperactivity disorder, unspecified type: Secondary | ICD-10-CM | POA: Insufficient documentation

## 2017-01-18 DIAGNOSIS — S82391A Other fracture of lower end of right tibia, initial encounter for closed fracture: Secondary | ICD-10-CM

## 2017-01-18 DIAGNOSIS — Z79899 Other long term (current) drug therapy: Secondary | ICD-10-CM | POA: Insufficient documentation

## 2017-01-18 DIAGNOSIS — Y9351 Activity, roller skating (inline) and skateboarding: Secondary | ICD-10-CM | POA: Insufficient documentation

## 2017-01-18 DIAGNOSIS — S82831A Other fracture of upper and lower end of right fibula, initial encounter for closed fracture: Secondary | ICD-10-CM

## 2017-01-18 DIAGNOSIS — S8252XA Displaced fracture of medial malleolus of left tibia, initial encounter for closed fracture: Secondary | ICD-10-CM | POA: Insufficient documentation

## 2017-01-18 DIAGNOSIS — Y929 Unspecified place or not applicable: Secondary | ICD-10-CM | POA: Insufficient documentation

## 2017-01-18 MED ORDER — KETOROLAC TROMETHAMINE 60 MG/2ML IM SOLN
60.0000 mg | Freq: Once | INTRAMUSCULAR | Status: AC
  Administered 2017-01-18: 60 mg via INTRAMUSCULAR
  Filled 2017-01-18: qty 2

## 2017-01-18 NOTE — ED Notes (Signed)
PA at bedside.

## 2017-01-18 NOTE — ED Notes (Signed)
Pt request blanket and pillow, provided.

## 2017-01-18 NOTE — ED Notes (Signed)
Bed: [id number] Expected date:  Expected time:  Means of arrival:  Comments: EMS 26 yo male ankle pain/rolled ankle pain/riding skateboard

## 2017-01-18 NOTE — ED Notes (Signed)
Pt presents to ED with c/o right ankle pain, injured while skateboarding earlier today. Slight swelling noted on right ankle. No deformity noted. Ice applied to affected area.

## 2017-01-18 NOTE — ED Provider Notes (Signed)
[facility]-EMERGENCY DEPT Provider Note   CSN: [id number] Arrival date & time: 01/18/17  2048   By signing my name below, I, George [name], attest that this documentation has been prepared under the direction and in the presence of George [name], [place]. Electronically Signed: Clarisse [name], Scribe. 01/18/17. 10:21 PM.   History   Chief Complaint Chief Complaint  Patient presents with  . Ankle Injury   The history is provided by the patient and medical records. No language interpreter was used.    HPI Comments: George [name] is a 26 y.o. male BIB EMS who presents to the Emergency Department complaining of lateral right ankle pain following a skateboarding accident ~6:30 PM. He states he outwardly everted his ankle while attempting a skateboarding trick. He adds he had to "put the ankle back in place" following the injury. He describes 10/10, worsening, achy, sharp pain. He notes associated tingling, and decreased ROM d/t pain. Pt states he has taken tylenol and applied ice without relief. Pt denies fever, chills and N/V/D.  Past Medical History:  Diagnosis Date  . ADHD (attention deficit hyperactivity disorder)   . Depression   . Schizophrenia ([facility])   . 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 Paranoid schizophrenia ([facility])   . Schizophrenia ([facility]) 12/08/2015    History reviewed. No pertinent surgical history.     Home Medications    Prior to Admission medications   Medication Sig Start Date End Date Taking? Authorizing Provider  benztropine (COGENTIN) 0.5 MG tablet Take 1 tablet (0.5 mg total) by mouth 2 (two) times daily in the am and at bedtime.. 03/17/16   Thermon Leyland, NP  hydrOXYzine (ATARAX/VISTARIL) 25 MG tablet Take 1 tablet (25 mg total) by mouth 3 (three) times daily as needed for anxiety. 03/17/16   Thermon Leyland, NP  lamoTRIgine (LAMICTAL) 25 MG tablet Take 2 tablets (50 mg total) by mouth daily. 03/17/16   Thermon Leyland,  NP  metFORMIN (GLUCOPHAGE) 1000 MG tablet Take 1 tablet (1,000 mg total) by mouth 2 (two) times daily with a meal. 03/17/16   Thermon Leyland, NP  OLANZapine (ZYPREXA) 15 MG tablet Take 1 tablet (15 mg total) by mouth at bedtime. 03/17/16   Thermon Leyland, NP  traZODone (DESYREL) 50 MG tablet Take 1 tablet (50 mg total) by mouth at bedtime. 03/17/16   Thermon Leyland, NP    Family History Family History  Problem Relation Age of Onset  . Cancer Father   . Diabetes Maternal Grandmother   . Alzheimer's disease Paternal Grandmother   . Heart failure Paternal Grandmother   . Heart failure Paternal Grandfather   . Heart failure Maternal Grandfather     Social History Social History  Substance Use Topics  . Smoking status: Never Smoker  . Smokeless tobacco: Never Used  . Alcohol use No     Allergies   Penicillins   Review of Systems Review of Systems  Constitutional: Negative for chills and fever.  Gastrointestinal: Negative for diarrhea, nausea and vomiting.  Musculoskeletal: Positive for arthralgias, gait problem and joint swelling.  Skin: Negative for wound.  Neurological: Positive for numbness. Negative for weakness.     Physical Exam Updated Vital Signs BP (!) 157/91   Pulse 88   Temp 98.3 F (36.8 C) (Oral)   Resp 18   Ht 5\' 10"  (1.778 m)   Wt 243 lb (110.2 kg)   SpO2 97%  BMI 34.87 kg/m   Physical Exam  Constitutional: He is oriented to person, place, and time. He appears well-developed and well-nourished.  Well appearing  HENT:  Head: Normocephalic and atraumatic.  Eyes: EOM are normal. Pupils are equal, round, and reactive to light.  Neck: Normal range of motion. Neck supple. No JVD present.  Cardiovascular: Normal rate, regular rhythm and intact distal pulses.  Exam reveals no gallop and no friction rub.   No murmur heard. Intact distal pulses BLE.   Pulmonary/Chest: Effort normal. No respiratory distress. He has no wheezes.  Abdominal: He exhibits no  distension. There is no rebound and no guarding.  Musculoskeletal: Normal range of motion.  Decreased ROM to right ankle secondary to pain and discomfort. No tenderness over right knee. No tenderness over proximal fibula.   Neurological: He is alert and oriented to person, place, and time.  Sensation intact to BLE distally to area affected. Pt able to push against resistance to plantar and dorsi flexion of right foot.   Skin: No rash noted. No pallor.  No opening of skin. Swelling noted at right ankle. TTP to right ankle, left and right malleolus. No discoloration, no redness noted.   Psychiatric: He has a normal mood and affect. His behavior is normal.  Nursing note and vitals reviewed.   ED Treatments / Results  DIAGNOSTIC STUDIES: Oxygen Saturation is 97% on RA, normal by my interpretation.    COORDINATION OF CARE: 10:17 PM Discussed treatment plan with pt at bedside and pt agreed to plan. Will order imaging and pain medications.  Labs (all labs ordered are listed, but only abnormal results are displayed) Labs Reviewed - No data to display  EKG  EKG Interpretation None       Radiology Dg Ankle Complete Right  Result Date: 01/18/2017 CLINICAL DATA:  Skateboarding injury this afternoon. Fell and twisted the ankle. Pain and swelling to the lateral side of the right ankle radiating to the lateral and top of foot. EXAM: RIGHT ANKLE - COMPLETE 3+ VIEW; RIGHT FOOT COMPLETE - 3+ VIEW COMPARISON:  None. FINDINGS: Soft tissue swelling about the right ankle. There is an oblique fracture of the distal left fibula with extension to the tibia fibular and talofibular joints. Avulsion fracture off of the medial malleolus of the right ankle. Mild widening of the medial tibiotalar space. Posterior malleolus appears intact. Slight depression irregularity of the medial aspect of the talus may indicate a compression fracture. Right foot appears intact. No additional fracture or dislocation identified.  Soft tissue swelling over the dorsum of the hindfoot. Old appearing ununited ossicle adjacent to the cuboidal bone. IMPRESSION: Fractures of the medial and lateral malleolus of the right ankle with widening of the medial ankle joint space. Irregularity of the medial aspect of the talar dome suggesting a possible compression fracture. Soft tissue swelling. Electronically Signed   By: Burman NievesWilliam  Stevens M.D.   On: 01/18/2017 22:54   Ct Foot Right Wo Contrast  Result Date: 01/19/2017 CLINICAL DATA:  26 year old male with right ankle fracture. EXAM: CT OF THE RIGHT FOOT WITHOUT CONTRAST TECHNIQUE: Multidetector CT imaging of the right foot was performed according to the standard protocol. Multiplanar CT image reconstructions were also generated. COMPARISON:  Right foot radiograph dated 01/18/2017 FINDINGS: Bones/Joint/Cartilage There is a minimally displaced oblique fracture of the distal fibula extending into the ankle joint and talofibular articulation. There is a faint curvilinear bony density adjacent to the tip of the medial malleolus which likely represents a small cortical  avulsion fracture. There is minimal widening of the medial tibiotalar joint space. Small minimally displaced posterior malleolar fracture fragments noted. Small bone fragments along the posterolateral corner of the cuboid likely chronic. An avulsion injury is less likely but not entirely excluded. No other acute fracture identified. Ligaments Suboptimally assessed by CT. Muscles and Tendons No intramuscular hematoma. Soft tissues There is diffuse superficial soft tissue edema. No fluid collection. IMPRESSION: Minimally displaced oblique fracture of the distal fibula as well as small fracture fragments of the posterior malleolus and tiny cortical avulsion injury of the medial malleolus. There is minimal widening of the medial tibiotalar articulation. Small bony fragments along the posterolateral corner of the inferior surface of the cuboid may  be chronic. Small avulsion fracture is not excluded. No metatarsal fractures. Electronically Signed   By: Elgie Collard M.D.   On: 01/19/2017 00:41   Dg Foot Complete Right  Result Date: 01/18/2017 CLINICAL DATA:  Skateboarding injury this afternoon. Fell and twisted the ankle. Pain and swelling to the lateral side of the right ankle radiating to the lateral and top of foot. EXAM: RIGHT ANKLE - COMPLETE 3+ VIEW; RIGHT FOOT COMPLETE - 3+ VIEW COMPARISON:  None. FINDINGS: Soft tissue swelling about the right ankle. There is an oblique fracture of the distal left fibula with extension to the tibia fibular and talofibular joints. Avulsion fracture off of the medial malleolus of the right ankle. Mild widening of the medial tibiotalar space. Posterior malleolus appears intact. Slight depression irregularity of the medial aspect of the talus may indicate a compression fracture. Right foot appears intact. No additional fracture or dislocation identified. Soft tissue swelling over the dorsum of the hindfoot. Old appearing ununited ossicle adjacent to the cuboidal bone. IMPRESSION: Fractures of the medial and lateral malleolus of the right ankle with widening of the medial ankle joint space. Irregularity of the medial aspect of the talar dome suggesting a possible compression fracture. Soft tissue swelling. Electronically Signed   By: Burman Nieves M.D.   On: 01/18/2017 22:54    Procedures Procedures (including critical care time)  Medications Ordered in ED Medications  ketorolac (TORADOL) injection 60 mg (60 mg Intramuscular Given 01/18/17 2322)     Initial Impression / Assessment and Plan / ED Course  I have reviewed the triage vital signs and the nursing notes.  Pertinent labs & imaging results that were available during my care of the patient were reviewed by me and considered in my medical decision making (see chart for details).    Patient with ankle fracture on x-ray and CT. He has minimally  displaced oblique fracture of the distal fibula as well as small fracture fragments of the posterior malleolus and tiny cortical avulsion injury of the medial malleolus. There is minimal widening of the medial tibiotalar articulation.  He has no fevers, chills, other systemic signs. He is afebrile, hemodynamically stable. No concern for infectious process. He is neurovascularly intact, distal pulses intact.  23:15  I spoke with Dr. Roda Shutters regarding xray fracture result. He recommended a CT here, splint, and follow up outpatient.  CT ordered. Toradol given here in ED.  Patient felt better after Toradol given. 3:04 Ortho tech arrived splint patient's foot.  Patient given given ankle splint here in ED and crutches. Patient recommended to follow up with Dr. Roda Shutters outpatient regarding today's visit. Patient aware that this is a very important situation and needs to be followed up. Patient will be dc home & is agreeable with above plan. Conservative  therapy recommended and discussed. I have also discussed reasons to return immediately to the ER.  Patient expresses understanding and agrees with plan.  I personally performed the services described in this documentation, which was scribed in my presence. The recorded information has been reviewed and is accurate.  Final Clinical Impressions(s) / ED Diagnoses   Final diagnoses:  Other closed fracture of distal end of right fibula, initial encounter  Closed fracture of posterior malleolus of right tibia, initial encounter    New Prescriptions New Prescriptions   No medications on file     [address] [place], [place] 01/19/17 [address] [place], [place] 01/19/17 1159    Lorre Nick, MD 01/23/17 0045

## 2017-01-18 NOTE — ED Notes (Signed)
Pt requests ice packs removed, removed.

## 2017-01-19 NOTE — ED Notes (Signed)
Jody, NT has spoke with Ortho Tech at [facility], he will be on his way in a few minutes. Will inform patient of the wait.

## 2017-01-19 NOTE — ED Notes (Signed)
When going to bedside to inform patient delay in the wait, pt was snoring. Respirations are even, regular, and unlabored.

## 2017-01-19 NOTE — Progress Notes (Signed)
Orthopedic Tech Progress Note Patient Details:  George [name] December 26, 1990 [id number]  Ortho Devices Type of Ortho Device: Crutches, Post (short leg) splint, Stirrup splint Ortho Device/Splint Location: rle Ortho Device/Splint Interventions: Ordered, Application   [name], Javier Mamone J 01/19/2017, 4:06 AM

## 2017-01-19 NOTE — Discharge Instructions (Signed)
Call and make an appointment tomorrow with Dr. Roda ShuttersXu who is already aware of your case. Keep splint on and use crutches until seen by orthopedic surgeon. Rest, ice, compress, elevate right ankle. Take ibuprofen or naproxen as needed for pain and swelling.  Get help right away if: You develop a cold or blue foot or toes on the injured side. You develop severe pain in your injured leg, especially if the pain is increased with movement of your toes Get help right away if: Your pain is getting worse. The injured area tingles, becomes numb, or turns cold and blue. The part of your body above or below the cast is swollen and discolored. You cannot feel or move your fingers or toes. There is fluid leaking through the cast. You have severe pain or pressure under the cast. You have trouble breathing. You have shortness of breath. You have chest pain.

## 2017-01-22 ENCOUNTER — Ambulatory Visit (INDEPENDENT_AMBULATORY_CARE_PROVIDER_SITE_OTHER): Payer: Self-pay | Admitting: Orthopaedic Surgery

## 2017-01-22 ENCOUNTER — Encounter (INDEPENDENT_AMBULATORY_CARE_PROVIDER_SITE_OTHER): Payer: Self-pay | Admitting: Orthopaedic Surgery

## 2017-01-22 DIAGNOSIS — S82851A Displaced trimalleolar fracture of right lower leg, initial encounter for closed fracture: Secondary | ICD-10-CM

## 2017-01-22 NOTE — Progress Notes (Signed)
Office Visit Note   Patient: George [name]           Date of Birth: 07-07-1991           MRN: [id number] Visit Date: 01/22/2017              Requested by: No referring provider defined for this encounter. PCP: No PCP Per Patient   Assessment & Plan: Visit Diagnoses:  1. Displaced trimalleolar fracture of right lower leg, initial encounter for closed fracture     Plan: X-rays show displaced fibula fracture with small posterior malleolus and avulsion fracture of the medial malleolus. Recommend operative fixation of the ankle fracture. Discussed risks benefits alternatives to surgery we'll plan on doing the surgery later this week. In the meantime he is to keep it elevated all times to help the swelling.  Follow-Up Instructions: Return for 2 week postop visit.   Orders:  No orders of the defined types were placed in this encounter.  No orders of the defined types were placed in this encounter.     Procedures: No procedures performed   Clinical Data: No additional findings.   Subjective: Chief Complaint  Patient presents with  . Right Foot - Pain    Patient is a 26 year old gentleman who sustained a right ankle injury from skateboarding 3 is ago. He was evaluated in the ER and x-rays and CTs were obtained. He follows up today for further evaluation and treatment. The pain is moderate with swelling that does not radiate that's better with elevation worse with    Review of Systems  Constitutional: Negative.   All other systems reviewed and are negative.    Objective: Vital Signs: There were no vitals taken for this visit.  Physical Exam  Constitutional: He is oriented to person, place, and time. He appears well-developed and well-nourished.  HENT:  Head: Normocephalic and atraumatic.  Eyes: Pupils are equal, round, and reactive to light.  Neck: Neck supple.  Pulmonary/Chest: Effort normal.  Abdominal: Soft.  Musculoskeletal: Normal range of motion.    Neurological: He is alert and oriented to person, place, and time.  Skin: Skin is warm.  Psychiatric: He has a normal mood and affect. His behavior is normal. Judgment and thought content normal.  Nursing note and vitals reviewed.   Ortho Exam Exam of the right ankle shows moderate swelling with intact pulses and sensation. Specialty Comments:  No specialty comments available.  Imaging: No results found.   PMFS History: Patient Active Problem List   Diagnosis Date Noted  . Displaced trimalleolar fracture of right lower leg, initial encounter for closed fracture 01/22/2017  . Paranoid schizophrenia (HCC)   . Schizophrenia (HCC) 12/08/2015   Past Medical History:  Diagnosis Date  . ADHD (attention deficit hyperactivity disorder)   . Depression   . Schizophrenia (HCC)   . 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 Cancer Father   . Diabetes Maternal Grandmother   . Alzheimer's disease Paternal Grandmother   . Heart failure Paternal Grandmother   . Heart failure Paternal Grandfather   . Heart failure Maternal Grandfather     No past surgical history on file. Social History   Occupational History  . Not on file.   Social History Main Topics  . Smoking status: Heavy Tobacco Smoker  . Smokeless tobacco: Never Used  . Alcohol use No  . Drug use: No  . Sexual activity: Not on file  Comment: Unknown

## 2017-01-25 ENCOUNTER — Other Ambulatory Visit (INDEPENDENT_AMBULATORY_CARE_PROVIDER_SITE_OTHER): Payer: Self-pay | Admitting: Orthopaedic Surgery

## 2017-01-25 ENCOUNTER — Encounter (HOSPITAL_COMMUNITY): Payer: Self-pay | Admitting: *Deleted

## 2017-01-25 DIAGNOSIS — S82851G Displaced trimalleolar fracture of right lower leg, subsequent encounter for closed fracture with delayed healing: Secondary | ICD-10-CM

## 2017-01-25 NOTE — Progress Notes (Signed)
George [name] has had a change in medications since [facility] was [facility]. [name]  Patient was on Metformin  Because he was taking Ability. Patient does not check CBG.  Patient said that he has not had an A1C since he has been off of Abilify

## 2017-01-26 ENCOUNTER — Inpatient Hospital Stay (HOSPITAL_COMMUNITY)
Admission: RE | Admit: 2017-01-26 | Discharge: 2017-02-02 | DRG: 493 | Disposition: A | Discharge status: Home / Self Care | Payer: Federal, State, Local not specified - Other | Source: Ambulatory Visit | Attending: Orthopaedic Surgery | Admitting: Orthopaedic Surgery

## 2017-01-26 ENCOUNTER — Encounter (HOSPITAL_COMMUNITY): Payer: Self-pay | Admitting: *Deleted

## 2017-01-26 DIAGNOSIS — Z79899 Other long term (current) drug therapy: Secondary | ICD-10-CM

## 2017-01-26 DIAGNOSIS — S82851G Displaced trimalleolar fracture of right lower leg, subsequent encounter for closed fracture with delayed healing: Secondary | ICD-10-CM

## 2017-01-26 DIAGNOSIS — Z880 Allergy status to penicillin: Secondary | ICD-10-CM

## 2017-01-26 DIAGNOSIS — F3162 Bipolar disorder, current episode mixed, moderate: Secondary | ICD-10-CM | POA: Diagnosis present

## 2017-01-26 DIAGNOSIS — Z419 Encounter for procedure for purposes other than remedying health state, unspecified: Secondary | ICD-10-CM

## 2017-01-26 DIAGNOSIS — F909 Attention-deficit hyperactivity disorder, unspecified type: Secondary | ICD-10-CM | POA: Diagnosis present

## 2017-01-26 DIAGNOSIS — Z793 Long term (current) use of hormonal contraceptives: Secondary | ICD-10-CM

## 2017-01-26 DIAGNOSIS — S82851A Displaced trimalleolar fracture of right lower leg, initial encounter for closed fracture: Principal | ICD-10-CM | POA: Diagnosis present

## 2017-01-26 DIAGNOSIS — F17200 Nicotine dependence, unspecified, uncomplicated: Secondary | ICD-10-CM | POA: Diagnosis present

## 2017-01-26 DIAGNOSIS — Z590 Homelessness: Secondary | ICD-10-CM

## 2017-01-26 DIAGNOSIS — Z6834 Body mass index (BMI) 34.0-34.9, adult: Secondary | ICD-10-CM

## 2017-01-26 DIAGNOSIS — F419 Anxiety disorder, unspecified: Secondary | ICD-10-CM | POA: Diagnosis present

## 2017-01-26 DIAGNOSIS — D62 Acute posthemorrhagic anemia: Secondary | ICD-10-CM | POA: Diagnosis not present

## 2017-01-26 DIAGNOSIS — E669 Obesity, unspecified: Secondary | ICD-10-CM | POA: Diagnosis present

## 2017-01-26 HISTORY — DX: Anxiety disorder, unspecified: F41.9

## 2017-01-26 HISTORY — DX: Other fracture of unspecified lower leg, initial encounter for closed fracture: S82.899A

## 2017-01-26 HISTORY — DX: Bipolar disorder, unspecified: F31.9

## 2017-01-26 LAB — CREATININE, SERUM
CREATININE: 0.86 mg/dL (ref 0.61–1.24)
GFR calc Af Amer: >60 mL/min (ref >60)
GFR calc non Af Amer: >60 mL/min (ref >60)

## 2017-01-26 LAB — CBC
HCT: 41.4 % (ref 39.0–52.0)
HCT: 39.8 % (ref 39.0–52.0)
HEMOGLOBIN: 13.2 g/dL (ref 13.0–17.0)
Hemoglobin: 12.6 g/dL — ABNORMAL LOW (ref 13.0–17.0)
MCH: 27.1 pg (ref 26.0–34.0)
MCH: 27 pg (ref 26.0–34.0)
MCHC: 31.9 g/dL (ref 30.0–36.0)
MCHC: 31.7 g/dL (ref 30.0–36.0)
MCV: 85 fL (ref 78.0–100.0)
MCV: 85.2 fL (ref 78.0–100.0)
PLATELETS: 298 10*3/uL (ref 150–400)
PLATELETS: 305 10*3/uL (ref 150–400)
RBC: 4.87 MIL/uL (ref 4.22–5.81)
RBC: 4.67 MIL/uL (ref 4.22–5.81)
RDW: 14.7 % (ref 11.5–15.5)
RDW: 14.7 % (ref 11.5–15.5)
WBC: 10.6 10*3/uL — AB (ref 4.0–10.5)
WBC: 9.5 10*3/uL (ref 4.0–10.5)

## 2017-01-26 LAB — SURGICAL PCR SCREEN
MRSA, PCR: NEGATIVE
STAPHYLOCOCCUS AUREUS: POSITIVE — AB

## 2017-01-26 LAB — GLUCOSE, CAPILLARY: GLUCOSE-CAPILLARY: 88 mg/dL (ref 65–99)

## 2017-01-26 MED ORDER — HYDROXYZINE HCL 25 MG PO TABS
25.0000 mg | ORAL_TABLET | Freq: Three times a day (TID) | ORAL | Status: DC | PRN
  Administered 2017-01-26 – 2017-02-01 (×8): 25 mg via ORAL
  Filled 2017-01-26 (×8): qty 1

## 2017-01-26 MED ORDER — LIDOCAINE 2% (20 MG/ML) 5 ML SYRINGE
INTRAMUSCULAR | Status: AC
  Filled 2017-01-26: qty 5

## 2017-01-26 MED ORDER — ACETAMINOPHEN 325 MG PO TABS
650.0000 mg | ORAL_TABLET | Freq: Four times a day (QID) | ORAL | Status: DC | PRN
  Administered 2017-01-27: 650 mg via ORAL
  Filled 2017-01-26: qty 2

## 2017-01-26 MED ORDER — SUCCINYLCHOLINE CHLORIDE 200 MG/10ML IV SOSY
PREFILLED_SYRINGE | INTRAVENOUS | Status: AC
  Filled 2017-01-26: qty 10

## 2017-01-26 MED ORDER — ACETAMINOPHEN 650 MG RE SUPP: 650.0000 mg | Freq: Four times a day (QID) | RECTAL | Status: DC | PRN

## 2017-01-26 MED ORDER — METFORMIN HCL 500 MG PO TABS: 1000.0000 mg | ORAL_TABLET | Freq: Two times a day (BID) | ORAL | Status: DC

## 2017-01-26 MED ORDER — TRAZODONE HCL 50 MG PO TABS: 50.0000 mg | ORAL_TABLET | Freq: Every day | ORAL | Status: DC

## 2017-01-26 MED ORDER — LACTATED RINGERS IV SOLN
INTRAVENOUS | Status: DC
  Administered 2017-01-26 – 2017-01-31 (×2): via INTRAVENOUS

## 2017-01-26 MED ORDER — LAMOTRIGINE 100 MG PO TABS: 50.0000 mg | ORAL_TABLET | Freq: Every day | ORAL | Status: DC

## 2017-01-26 MED ORDER — QUETIAPINE FUMARATE 50 MG PO TABS
250.0000 mg | ORAL_TABLET | Freq: Every day | ORAL | Status: DC
  Administered 2017-01-26 – 2017-01-29 (×4): 250 mg via ORAL
  Filled 2017-01-26 (×5): qty 1

## 2017-01-26 MED ORDER — OXCARBAZEPINE 150 MG PO TABS
150.0000 mg | ORAL_TABLET | Freq: Two times a day (BID) | ORAL | Status: DC
Start: 2017-01-26 — End: 2017-02-02
  Administered 2017-01-26 – 2017-02-02 (×14): 150 mg via ORAL
  Filled 2017-01-26 (×15): qty 1

## 2017-01-26 MED ORDER — PROPOFOL 10 MG/ML IV BOLUS
INTRAVENOUS | Status: AC
  Filled 2017-01-26: qty 20

## 2017-01-26 MED ORDER — CALCIUM CARBONATE ANTACID 500 MG PO CHEW: 4.0000 | CHEWABLE_TABLET | ORAL | Status: DC | PRN

## 2017-01-26 MED ORDER — FENTANYL CITRATE (PF) 100 MCG/2ML IJ SOLN
INTRAMUSCULAR | Status: DC
Start: 2017-01-26 — End: 2017-01-26
  Filled 2017-01-26: qty 2

## 2017-01-26 MED ORDER — HEPARIN SODIUM (PORCINE) 5000 UNIT/ML IJ SOLN
5000.0000 [IU] | Freq: Three times a day (TID) | INTRAMUSCULAR | Status: DC
  Administered 2017-01-26 – 2017-01-30 (×12): 5000 [IU] via SUBCUTANEOUS
  Filled 2017-01-26 (×12): qty 1

## 2017-01-26 MED ORDER — OLANZAPINE 5 MG PO TABS: 15.0000 mg | ORAL_TABLET | Freq: Every day | ORAL | Status: DC

## 2017-01-26 MED ORDER — ARTIFICIAL TEARS OP OINT
TOPICAL_OINTMENT | OPHTHALMIC | Status: AC
  Filled 2017-01-26: qty 3.5

## 2017-01-26 MED ORDER — BENZTROPINE MESYLATE 0.5 MG PO TABS: 0.5000 mg | ORAL_TABLET | ORAL | Status: DC

## 2017-01-26 MED ORDER — QUETIAPINE FUMARATE 25 MG PO TABS
25.0000 mg | ORAL_TABLET | Freq: Every day | ORAL | Status: DC
Start: 2017-01-27 — End: 2017-01-30
  Administered 2017-01-27 – 2017-01-30 (×4): 25 mg via ORAL
  Filled 2017-01-26 (×4): qty 1

## 2017-01-26 MED ORDER — MIDAZOLAM HCL 2 MG/2ML IJ SOLN
INTRAMUSCULAR | Status: AC
  Filled 2017-01-26: qty 2

## 2017-01-26 MED ORDER — CLINDAMYCIN PHOSPHATE 900 MG/50ML IV SOLN
900.0000 mg | INTRAVENOUS | Status: DC
  Filled 2017-01-26: qty 50

## 2017-01-26 MED ORDER — ROCURONIUM BROMIDE 50 MG/5ML IV SOSY
PREFILLED_SYRINGE | INTRAVENOUS | Status: AC
  Filled 2017-01-26: qty 5

## 2017-01-26 MED ORDER — IBUPROFEN 200 MG PO TABS
800.0000 mg | ORAL_TABLET | Freq: Four times a day (QID) | ORAL | Status: DC | PRN
  Administered 2017-01-26 – 2017-01-30 (×7): 800 mg via ORAL
  Filled 2017-01-26 (×7): qty 4

## 2017-01-26 MED ORDER — ONDANSETRON HCL 4 MG/2ML IJ SOLN
INTRAMUSCULAR | Status: AC
  Filled 2017-01-26: qty 2

## 2017-01-26 MED ORDER — FENTANYL CITRATE (PF) 100 MCG/2ML IJ SOLN
INTRAMUSCULAR | Status: AC
  Filled 2017-01-26: qty 2

## 2017-01-26 NOTE — H&P (Signed)
PREOPERATIVE H&P  Chief Complaint: right trimalleolar ankle fracture  HPI: George [name] is a 26 y.o. male who presents for surgical treatment of right trimalleolar ankle fracture.  He denies any changes in medical history.  Past Medical History:  Diagnosis Date  . ADHD (attention deficit hyperactivity disorder)   . Anxiety   . Bipolar disorder (HCC)   . Depression   . 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 NO PAST SURGERIES     Social History   Social History  . Marital status: Single    Spouse name: N/A  . Number of children: N/A  . Years of education: N/A   Social History Main Topics  . Smoking status: Current Every Day Smoker    Packs/day: 1.00    Years: 10.00  . Smokeless tobacco: Never Used     Comment: off and on  . Alcohol use No  . Drug use: No  . Sexual activity: Not Asked     Comment: Unknown   Other Topics Concern  . None   Social History Narrative  . None   Family History  Problem Relation Age of Onset  . Cancer Father   . Diabetes Maternal Grandmother   . Alzheimer's disease Paternal Grandmother   . Heart failure Paternal Grandmother   . Heart failure Paternal Grandfather   . Heart failure Maternal Grandfather    Allergies  Allergen Reactions  . Amoxicillin Rash    "throat started to close up; happened when I was about 89 or 26 years old"  . Penicillins Rash and Other (See Comments)    Has patient had a PCN reaction causing immediate rash, facial/tongue/throat swelling, SOB or lightheadedness with hypotension: unsure Has patient had a PCN reaction causing severe rash involving mucus membranes or skin necrosis: Unsure Has patient had a PCN reaction that required hospitalization Unsure Has patient had a PCN reaction occurring within the last 10 years: No If all of the above answers are "NO", then may proceed with Cephalosporin use.   Prior to Admission medications   Medication Sig Start Date  End Date Taking? Authorizing Provider  Acetaminophen (TYLENOL PO) Take 2 tablets by mouth every 6 (six) hours as needed (for pain).   Yes Historical Provider, MD  calcium carbonate (TUMS - DOSED IN MG ELEMENTAL CALCIUM) 500 MG chewable tablet Chew 4 tablets by mouth as needed for indigestion or heartburn.    Yes Historical Provider, MD  hydrOXYzine (ATARAX/VISTARIL) 25 MG tablet Take 1 tablet (25 mg total) by mouth 3 (three) times daily as needed for anxiety. Patient taking differently: Take 25 mg by mouth 4 (four) times daily as needed for anxiety.  03/17/16  Yes Thermon LeylandLaura A Davis, NP  ibuprofen (ADVIL,MOTRIN) 200 MG tablet Take 800 mg by mouth every 6 (six) hours as needed for headache or moderate pain.    Yes Historical Provider, MD  Oxcarbazepine (TRILEPTAL) 300 MG tablet Take 150 mg by mouth 2 (two) times daily.   Yes Historical Provider, MD  QUEtiapine (SEROQUEL) 200 MG tablet Take 250 mg by mouth at bedtime.   Yes Historical Provider, MD  QUEtiapine (SEROQUEL) 25 MG tablet Take 25 mg by mouth daily.    Yes Historical Provider, MD  benztropine (COGENTIN) 0.5 MG tablet Take 1 tablet (0.5 mg total) by mouth 2 (two) times daily in the am and at bedtime.. Patient not taking: Reported on 01/26/2017 03/17/16   Thermon LeylandLaura A Davis, NP  lamoTRIgine (  LAMICTAL) 25 MG tablet Take 2 tablets (50 mg total) by mouth daily. Patient not taking: Reported on 01/26/2017 03/17/16   Thermon Leyland, NP  metFORMIN (GLUCOPHAGE) 1000 MG tablet Take 1 tablet (1,000 mg total) by mouth 2 (two) times daily with a meal. Patient not taking: Reported on 01/26/2017 03/17/16   Thermon Leyland, NP  OLANZapine (ZYPREXA) 15 MG tablet Take 1 tablet (15 mg total) by mouth at bedtime. Patient not taking: Reported on 01/26/2017 03/17/16   Thermon Leyland, NP  traZODone (DESYREL) 50 MG tablet Take 1 tablet (50 mg total) by mouth at bedtime. Patient not taking: Reported on 01/26/2017 03/17/16   Thermon Leyland, NP     Positive ROS: All other systems have been  reviewed and were otherwise negative with the exception of those mentioned in the HPI and as above.  Physical Exam: General: Alert, no acute distress Cardiovascular: No pedal edema Respiratory: No cyanosis, no use of accessory musculature GI: abdomen soft Skin: No lesions in the area of chief complaint Neurologic: Sensation intact distally Psychiatric: Patient is competent for consent with normal mood and affect Lymphatic: no lymphedema  MUSCULOSKELETAL: exam stable  Assessment: right trimalleolar ankle fracture  Plan: Plan for Procedure(s): OPEN REDUCTION INTERNAL FIXATION (ORIF) RIGHT TRIMALLEOLAR ANKLE FRACTURE  The risks benefits and alternatives were discussed with the patient including but not limited to the risks of nonoperative treatment, versus surgical intervention including infection, bleeding, nerve injury,  blood clots, cardiopulmonary complications, morbidity, mortality, among others, and they were willing to proceed.   Glee Arvin, MD   01/26/2017 12:13 PM

## 2017-01-26 NOTE — Progress Notes (Signed)
   01/26/17 1510  Clinical Encounter Type  Visited With Patient and family together  Visit Type Initial  Referral From Nurse  Consult/Referral To Chaplain  Recommendations (follow up as needed)  Spiritual Encounters  Spiritual Needs Prayer;Emotional;Other (Comment)  Stress Factors  Patient Stress Factors Family relationships;Health changes;Lack of knowledge  Family Stress Factors None identified;Family relationships  Pt. Is going for orthopedic surgery, mentions that he suffers from bi-polar disorder, did not really request a Chaplain, was actually making humor in reference to the question as to whether or not he wanted to see a chaplain during his hospital visit.  However, pt. Invited Chaplain into the room, offered prayer ministry of presence, active listening, emotional support.  Suggest a follow up by unit Chaplain.    Chaplain Maevyn Riordan A. Kaiel Weide  [phone]
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
## 2017-01-26 NOTE — Anesthesia Preprocedure Evaluation (Addendum)
Anesthesia Evaluation  Patient identified by MRN, date of birth, ID band Patient awake    Reviewed: Allergy & Precautions, NPO status , Patient's Chart, lab work & pertinent test results  History of Anesthesia Complications Negative for: history of anesthetic complications  Airway Mallampati: II  TM Distance: >3 FB Neck ROM: Full    Dental  (+) Teeth Intact, Dental Advisory Given   Pulmonary Current Smoker,    Pulmonary exam normal breath sounds clear to auscultation       Cardiovascular Exercise Tolerance: Good negative cardio ROS Normal cardiovascular exam Rhythm:Regular Rate:Normal     Neuro/Psych PSYCHIATRIC DISORDERS Anxiety Depression Bipolar Disorder Schizophrenia negative neurological ROS     GI/Hepatic negative GI ROS, Neg liver ROS,   Endo/Other  Obesity   Renal/GU negative Renal ROS     Musculoskeletal negative musculoskeletal ROS (+)   Abdominal   Peds  Hematology negative hematology ROS (+)   Anesthesia Other Findings Day of surgery medications reviewed with the patient.  Reproductive/Obstetrics                            Anesthesia Physical Anesthesia Plan  ASA: II  Anesthesia Plan: General and Regional   Post-op Pain Management:  Regional for Post-op pain   Induction: Intravenous  Airway Management Planned: Oral ETT  Additional Equipment: None  Intra-op Plan:   Post-operative Plan: Extubation in OR  Informed Consent: I have reviewed the patients History and Physical, chart, labs and discussed the procedure including the risks, benefits and alternatives for the proposed anesthesia with the patient or authorized representative who has indicated his/her understanding and acceptance.   Dental advisory given  Plan Discussed with: CRNA and Surgeon  Anesthesia Plan Comments: (Risks/benefits of general anesthesia discussed with patient including risk of damage to  teeth, lips, gum, and tongue, nausea/vomiting, allergic reactions to medications, and the possibility of heart attack, stroke and death.  All patient questions answered.  Patient wishes to proceed.)      Anesthesia Quick Evaluation
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
## 2017-01-26 NOTE — Progress Notes (Signed)
Called report to Tobi BastosAnna, RN on 5N. Verbalized understanding. Patient awaiting transport.

## 2017-01-27 LAB — HIV ANTIBODY (ROUTINE TESTING W REFLEX): HIV SCREEN 4TH GENERATION: NONREACTIVE

## 2017-01-27 NOTE — Progress Notes (Signed)
Subjective: * Surgery Date in Future * Procedure(s) (LRB): OPEN REDUCTION INTERNAL FIXATION (ORIF) RIGHT TRIMALLEOLAR ANKLE FRACTURE (Right) Patient reports pain as mild and moderate.    Objective: Vital signs in last 24 hours: Temp:  [98 F (36.7 C)] 98 F (36.7 C) (03/24 0700) Pulse Rate:  [76-82] 82 (03/24 0700) BP: (106-133)/(46-64) 133/64 (03/24 0700) SpO2:  [95 %-99 %] 99 % (03/24 0700) Weight:  [243 lb (110.2 kg)] 243 lb (110.2 kg) (03/23 1213)  Intake/Output from previous day: 03/23 0701 - 03/24 0700 In: -  Out: 1350 [Urine:1350] Intake/Output this shift: Total I/O In: 240 [P.O.:240] Out: -    Recent Labs  01/26/17 1136 01/26/17 1821  HGB 13.2 12.6*    Recent Labs  01/26/17 1136 01/26/17 1821  WBC 10.6* 9.5  RBC 4.87 4.67  HCT 41.4 39.8  PLT 298 305    Recent Labs  01/26/17 1821  CREATININE 0.86   No results for input(s): LABPT, INR in the last 72 hours.  Sensation intact distally  Assessment/Plan: * Surgery Date in Future * Procedure(s) (LRB): OPEN REDUCTION INTERNAL FIXATION (ORIF) RIGHT TRIMALLEOLAR ANKLE FRACTURE (Right) Surgery on Wednesday  Jacqualine CodeBrian Petrarca 01/27/2017, 11:08 AM

## 2017-01-27 NOTE — Plan of Care (Signed)
Problem: Safety: Goal: Ability to remain free from injury will improve Outcome: Progressing No safety issues noted  Problem: Pain Managment: Goal: General experience of comfort will improve Outcome: Progressing Denies pain  Problem: Physical Regulation: Goal: Will remain free from infection Outcome: Progressing VS WN  Problem: Tissue Perfusion: Goal: Risk factors for ineffective tissue perfusion will decrease Outcome: Progressing On Heparin SQ  Problem: Activity: Goal: Risk for activity intolerance will decrease Outcome: Progressing Tolerates activities well  Problem: Bowel/Gastric: Goal: Will not experience complications related to bowel motility Outcome: Progressing No gastric or bowel issues reported

## 2017-01-27 NOTE — Progress Notes (Signed)
Called to pt room by tech stating that pt had removed his IV while in the shower. Pt states he is tired of "that thing" swinging on his arm. Explained to pt that is procedure for each in-pt to have  IV access and reasons explained. Pt states he doesn't want one. RN explained that if IV meds ordered or fluids ordered an IV would need to be established.  Currently pt meds do not require IV access.

## 2017-01-28 NOTE — Progress Notes (Signed)
Subjective: * Surgery Date in Future * Procedure(s) (LRB): OPEN REDUCTION INTERNAL FIXATION (ORIF) RIGHT TRIMALLEOLAR ANKLE FRACTURE (Right) Patient reports pain as mild and moderate.    Objective: Vital signs in last 24 hours: Temp:  [98 F (36.7 C)-98.7 F (37.1 C)] 98 F (36.7 C) (03/25 0556) Pulse Rate:  [68-90] 68 (03/25 0556) Resp:  [18] 18 (03/24 1620) BP: (120-137)/(45-66) 131/45 (03/25 0556) SpO2:  [98 %] 98 % (03/25 0556)  Intake/Output from previous day: 03/24 0701 - 03/25 0700 In: 1440 [P.O.:1440] Out: 1750 [Urine:1750] Intake/Output this shift: No intake/output data recorded.   Recent Labs  01/26/17 1136 01/26/17 1821  HGB 13.2 12.6*    Recent Labs  01/26/17 1136 01/26/17 1821  WBC 10.6* 9.5  RBC 4.87 4.67  HCT 41.4 39.8  PLT 298 305    Recent Labs  01/26/17 1821  CREATININE 0.86   No results for input(s): LABPT, INR in the last 72 hours.  Sensation intact distally No cellulitis present Compartment soft moving toes but ankle is swollen but not tense  Assessment/Plan: * Surgery Date in Future * Procedure(s) (LRB): OPEN REDUCTION INTERNAL FIXATION (ORIF) RIGHT TRIMALLEOLAR ANKLE FRACTURE (Right) Planned surgery for Wednesday Continue elevation  George CodeBrian [name] 01/28/2017, 9:46 AM

## 2017-01-29 MED ORDER — CHLORHEXIDINE GLUCONATE CLOTH 2 % EX PADS
6.0000 | MEDICATED_PAD | Freq: Every day | CUTANEOUS | Status: DC
  Administered 2017-01-29 – 2017-02-01 (×3): 6 via TOPICAL

## 2017-01-29 MED ORDER — MUPIROCIN 2 % EX OINT
1.0000 "application " | TOPICAL_OINTMENT | Freq: Two times a day (BID) | CUTANEOUS | Status: DC
  Administered 2017-01-29 – 2017-02-02 (×9): 1 via NASAL
  Filled 2017-01-29: qty 22

## 2017-01-29 NOTE — Progress Notes (Signed)
Swelling is improving with bedrest and elevation.  Continue such measures for planned surgery Wednesday.  subq heparin for DVT ppx.

## 2017-01-29 NOTE — Progress Notes (Signed)
Patient's mother, Rivka BarbaraGlenda, would like to talk to doctor Roda ShuttersXu. MD notified. Patient would also like to have crutches to get around the room. Patient is also requesting for psychiatric consult

## 2017-01-30 DIAGNOSIS — F17210 Nicotine dependence, cigarettes, uncomplicated: Secondary | ICD-10-CM

## 2017-01-30 DIAGNOSIS — F3162 Bipolar disorder, current episode mixed, moderate: Secondary | ICD-10-CM | POA: Diagnosis present

## 2017-01-30 DIAGNOSIS — Z79899 Other long term (current) drug therapy: Secondary | ICD-10-CM | POA: Diagnosis not present

## 2017-01-30 DIAGNOSIS — Z810 Family history of intellectual disabilities: Secondary | ICD-10-CM

## 2017-01-30 MED ORDER — QUETIAPINE FUMARATE 400 MG PO TABS
200.0000 mg | ORAL_TABLET | Freq: Every day | ORAL | Status: DC
  Administered 2017-01-30 – 2017-02-01 (×3): 200 mg via ORAL
  Filled 2017-01-30 (×3): qty 1

## 2017-01-30 MED ORDER — QUETIAPINE FUMARATE 25 MG PO TABS: 25.0000 mg | ORAL_TABLET | Freq: Every day | ORAL | Status: DC

## 2017-01-30 MED ORDER — QUETIAPINE FUMARATE 100 MG PO TABS
100.0000 mg | ORAL_TABLET | Freq: Every day | ORAL | Status: DC
  Administered 2017-01-31 – 2017-02-02 (×3): 100 mg via ORAL
  Filled 2017-01-30 (×5): qty 1

## 2017-01-30 MED ORDER — CLINDAMYCIN PHOSPHATE 600 MG/50ML IV SOLN
600.0000 mg | INTRAVENOUS | Status: AC
  Administered 2017-01-31: 600 mg via INTRAVENOUS
  Filled 2017-01-30: qty 50

## 2017-01-30 MED ORDER — CEFAZOLIN SODIUM-DEXTROSE 2-4 GM/100ML-% IV SOLN: 2.0000 g | Freq: Once | INTRAVENOUS | Status: DC

## 2017-01-30 NOTE — Clinical Social Work Note (Signed)
CSW met with pt today at bedside to address consult for homelessness. CSW and pt spoke at length about pt's mental health concerns, possible d/c barriers, and psych eval. Pt will have surgery tomorrow. CSW assessment to follow. CSW will continue to follow for d/c needs and intervention.   Oretha Ellis, [place], [place] Work [phone]

## 2017-01-30 NOTE — Consult Note (Signed)
[facility] Face-to-Face Psychiatry Consult   Reason for Consult:  Aggression, outbursts, anxiety, depression Referring Physician:  Dr. Roda Shutters Patient Identification: George [name] MRN:  [id number] Principal Diagnosis: Bipolar 1 disorder, mixed, moderate (HCC) Diagnosis:   Patient Active Problem List   Diagnosis Date Noted  . Bipolar 1 disorder, mixed, moderate (HCC) [F31.62] 01/30/2017  . Trimalleolar fracture of ankle, closed, right, initial encounter [S82.851A] 01/26/2017  . Displaced trimalleolar fracture of right lower leg, initial encounter for closed fracture [S82.851A] 01/22/2017  . Paranoid schizophrenia (HCC) [F20.0]   . Schizophrenia (HCC) [F20.9] 12/08/2015    Total Time spent with patient: 40 minutes  Subjective:   George [name] is a 26 y.o. male patient admitted for orthopedic evaluation and surgery. Pt seen and chart reviewed. Pt is alert/oriented x4, calm, cooperative, and appropriate to situation. Pt denies suicidal/homicidal ideation and psychosis and does not appear to be responding to internal stimuli.   Pt requested that his parents be on speakerphone for the evaluation, to which I agreed. Pt was able to reach his parents and they had several questions which we discussed. Pt reportedly has been diagnosed by [facility] [place]. Pt and family report that they are confused about his diagnoses as he was diagnosed with schizophrenia, then bipolar 1, then schizoaffective. However, pt does not endorse hallucinations or history of such and, after lengthy discussion, his symptoms are more consistent with Bipolar.   Pt reports that he does not have insurance at this time and that he has been taking Seroquel XR. However, the chart shows Seroquel (standard version) which is likely more consistent as the XR version can cost several hundred dollars monthly without insurance. Pt was calm and cooperative with me but does report a series of outbursts worsening  with current medication regimen, citing some improvement from Vistaril and Seroquel together. Pt CBG is trending below 100 and no hx of DM2, yet this will be a consideration for further titration outpatient after discharge. Counseled both pt and family on these considerations. They report that he will be following up with [facility] the [place]. Regarding living arrangements, family has not yet decided if he will be welcome in their home. SW consult would benefit pt for living arrangements post-discharge.   HPI:  George [name] is a 26 y.o. male presenting to the hospital for planned ORIF of right ankle but swelling was too severe for surgery. Patient was recently removed by police from his parents' house therefore has nowhere to go postop and currently.  He was admitted for strict elevation and icing to allow swelling to go down.  Pt has been cooperative with staff today and yesterday per RN report, although they do report a major outburst approximately 48h ago with family there in addition to pt removing his IV in the shower when he was agitated. Pt has a reported longstanding history of psychiatric treatment with medication management and cited difficulty in managing his anger.  Past Psychiatric History: Bipolar 1 (possible schizoaffective although not enough information to determine at this time)  Risk to Self: Is patient at risk for suicide?: No Risk to Others:   Prior Inpatient Therapy:   Prior Outpatient Therapy:    Past Medical History:  Past Medical History:  Diagnosis Date  . ADHD (attention deficit hyperactivity disorder)   . Anxiety   . Bipolar disorder (HCC)   . Depression   . 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 NO PAST SURGERIES     Family History:  Family History  Problem Relation Age of Onset  . Cancer Father   . Diabetes Maternal Grandmother   . Alzheimer's disease Paternal Grandmother   . Heart failure Paternal  Grandmother   . Heart failure Paternal Grandfather   . Heart failure Maternal Grandfather    Family Psychiatric  History: depression Social History:  History  Alcohol Use No     History  Drug Use No    Social History   Social History  . Marital status: Single    Spouse name: N/A  . Number of children: N/A  . Years of education: N/A   Social History Main Topics  . Smoking status: Current Every Day Smoker    Packs/day: 1.00    Years: 10.00  . Smokeless tobacco: Never Used     Comment: off and on  . Alcohol use No  . Drug use: No  . Sexual activity: Not Asked     Comment: Unknown   Other Topics Concern  . None   Social History Narrative  . None   Additional Social History:    Allergies:   Allergies  Allergen Reactions  . Amoxicillin Swelling and Rash    "throat started to close up"  . Penicillins Rash and Other (See Comments)    Has patient had a PCN reaction causing immediate rash, facial/tongue/throat swelling, SOB or lightheadedness with hypotension: unsure Has patient had a PCN reaction causing severe rash involving mucus membranes or skin necrosis: Unsure Has patient had a PCN reaction that required hospitalization Unsure Has patient had a PCN reaction occurring within the last 10 years: No If all of the above answers are "NO", then may proceed with Cephalosporin use.    Labs: No results found for this or any previous visit (from the past 48 hour(s)).  Current Facility-Administered Medications  Medication Dose Route Frequency Provider Last Rate Last Dose  . acetaminophen (TYLENOL) tablet 650 mg  650 mg Oral Q6H PRN Tarry KosNaiping M Xu, MD   650 mg at 01/27/17 1742   Or  . acetaminophen (TYLENOL) suppository 650 mg  650 mg Rectal Q6H PRN Naiping Donnelly StagerM Xu, MD      . calcium carbonate (TUMS - dosed in mg elemental calcium) chewable tablet 800 mg of elemental calcium  4 tablet Oral PRN Naiping Donnelly StagerM Xu, MD      . Chlorhexidine Gluconate Cloth 2 % PADS 6 each  6 each  Topical Q0600 Tarry KosNaiping M Xu, MD   6 each at 01/30/17 [phone]  . [START ON 01/31/2017] clindamycin (CLEOCIN) IVPB 600 mg  600 mg Intravenous To OR Naiping Donnelly StagerM Xu, MD      . hydrOXYzine (ATARAX/VISTARIL) tablet 25 mg  25 mg Oral TID PRN Tarry KosNaiping M Xu, MD   25 mg at 01/30/17 0942  . ibuprofen (ADVIL,MOTRIN) tablet 800 mg  800 mg Oral Q6H PRN Tarry KosNaiping M Xu, MD   800 mg at 01/30/17 1351  . lactated ringers infusion   Intravenous Continuous Cecile HearingStephen Edward Turk, MD 10 mL/hr at 01/26/17 1236    . mupirocin ointment (BACTROBAN) 2 % 1 application  1 application Nasal BID Naiping Donnelly StagerM Xu, MD   1 application at 01/30/17 1000  . OXcarbazepine (TRILEPTAL) tablet 150 mg  150 mg Oral BID Tarry KosNaiping M Xu, MD   150 mg at 01/30/17 0936  . [START ON 01/31/2017] QUEtiapine (SEROQUEL) tablet 100 mg  100 mg Oral Daily Beau FannyJohn C Antionio Negron,  FNP      . QUEtiapine (SEROQUEL) tablet 200 mg  200 mg Oral QHS Beau Fanny, FNP        Musculoskeletal: Strength & Muscle Tone: within normal limits Gait & Station: unsteady Patient leans: N/A  Psychiatric Specialty Exam: Physical Exam  Review of Systems  Psychiatric/Behavioral: Positive for depression. Negative for hallucinations, substance abuse and suicidal ideas. The patient is nervous/anxious and has insomnia.   All other systems reviewed and are negative.   Blood pressure 125/66, pulse 94, temperature 97.8 F (36.6 C), temperature source Oral, resp. rate 18, height 5\' 10"  (1.778 m), weight 110.2 kg (243 lb), SpO2 99 %.Body mass index is 34.87 kg/m.  General Appearance: Casual and Fairly Groomed  Eye Contact:  Good  Speech:  Clear and Coherent and Normal Rate  Volume:  Normal  Mood:  Anxious  Affect:  Appropriate and Congruent  Thought Process:  Coherent, Goal Directed, Linear and Descriptions of Associations: Intact  Orientation:  Full (Time, Place, and Person)  Thought Content:  Focused on anger management  Suicidal Thoughts:  No  Homicidal Thoughts:  No  Memory:  Immediate;    Fair Recent;   Fair Remote;   Fair  Judgement:  Fair  Insight:  Fair  Psychomotor Activity:  Normal  Concentration:  Concentration: Fair and Attention Span: Fair  Recall:  [employer] of Knowledge:  Fair  Language:  Fair  Akathisia:  No  Handed:    AIMS (if indicated):     Assets:  Communication Skills Desire for Improvement Resilience Social Support  ADL's:  Intact  Cognition:  WNL  Sleep:      Treatment Plan Summary: Bipolar 1 disorder, mixed, moderate (HCC) with medication recommendations below, stable for outpatient treatment after medical hospital course.  Medications: -Modify daytime Seroquel to 100mg  po daily for mood stabilization/anger -Modify qhs Seroquel to 200mg  po qhs for mood stabilization/anger -Continue Trileptal 150mg  po bid although this will likely need to be titrated higher outpatient to be effective -Outpatient should consider SSRI although rebound suicidal ideation is a risk to start him on this now and the alternatives (Remeron) would not be ideal to mix with Seroquel due to 5HT2c affinity and risk of weight gain in both meds  Labs/tests: -Please continue daily CBGS (AM only is fine for now) -Obtain new 12-lead EKG for Qtc baseline (Seroquel) -Outpatient can obtain lipids and prolactin as they are not likely high at his current low-dose regimen but would be ideal; A1C is likely WNL given his good CBG thus far   Disposition: No evidence of imminent risk to self or others at present.   Patient does not meet criteria for psychiatric inpatient admission. Supportive therapy provided about ongoing stressors. Refer to IOP. Discussed crisis plan, support from social network, calling 911, coming to the Emergency Department, and calling Suicide Hotline.  Beau Fanny, [place] 01/30/2017 6:28 PM

## 2017-01-30 NOTE — H&P (Signed)
H&P update  The surgical history has been reviewed and remains accurate without interval change.  The patient was re-examined and patient's physiologic condition has not changed significantly in the last 30 days. The condition still exists that makes this procedure necessary. The treatment plan remains the same, without new options for care.  No new pharmacological allergies or types of therapy has been initiated that would change the plan or the appropriateness of the plan.  The patient and/or family understand the potential benefits and risks.  Mayra ReelN. Michael Xu, MD 01/30/2017 5:42 PM

## 2017-01-30 NOTE — Progress Notes (Signed)
Patient concerned about when psychiatric consult would be. Nurse called behavioral health to see when someone would come for a consult. Nurse directed to a Child psychotherapistsocial worker who stated that someone would be by this evening. Patient would like to know in advance so his parents can be with him

## 2017-01-31 ENCOUNTER — Encounter (HOSPITAL_COMMUNITY): Payer: Self-pay | Admitting: Anesthesiology

## 2017-01-31 ENCOUNTER — Inpatient Hospital Stay (HOSPITAL_COMMUNITY): Payer: Self-pay

## 2017-01-31 ENCOUNTER — Ambulatory Visit (HOSPITAL_COMMUNITY): Payer: Self-pay | Admitting: Certified Registered Nurse Anesthetist

## 2017-01-31 ENCOUNTER — Encounter (HOSPITAL_COMMUNITY)
Admission: RE | Disposition: A | Discharge status: Home / Self Care | Payer: Self-pay | Source: Ambulatory Visit | Attending: Orthopaedic Surgery

## 2017-01-31 HISTORY — PX: ORIF ANKLE FRACTURE: SHX5408

## 2017-01-31 SURGERY — OPEN REDUCTION INTERNAL FIXATION (ORIF) ANKLE FRACTURE
Anesthesia: Regional | Site: Ankle | Laterality: Right

## 2017-01-31 MED ORDER — ONDANSETRON HCL 4 MG/2ML IJ SOLN
INTRAMUSCULAR | Status: DC | PRN
  Administered 2017-01-31: 4 mg via INTRAVENOUS

## 2017-01-31 MED ORDER — FENTANYL CITRATE (PF) 250 MCG/5ML IJ SOLN
INTRAMUSCULAR | Status: AC
  Filled 2017-01-31: qty 5

## 2017-01-31 MED ORDER — 0.9 % SODIUM CHLORIDE (POUR BTL) OPTIME
TOPICAL | Status: DC | PRN
  Administered 2017-01-31: 1000 mL

## 2017-01-31 MED ORDER — OXYCODONE-ACETAMINOPHEN 5-325 MG PO TABS: 1.0000 | ORAL_TABLET | ORAL | 0 refills | Status: DC | PRN

## 2017-01-31 MED ORDER — BUPIVACAINE HCL (PF) 0.25 % IJ SOLN
INTRAMUSCULAR | Status: AC
  Filled 2017-01-31: qty 30

## 2017-01-31 MED ORDER — OXYCODONE HCL 5 MG PO TABS
5.0000 mg | ORAL_TABLET | ORAL | Status: DC | PRN
  Administered 2017-02-01: 10 mg via ORAL
  Administered 2017-02-01: 5 mg via ORAL
  Administered 2017-02-01: 10 mg via ORAL
  Administered 2017-02-01: 5 mg via ORAL
  Administered 2017-02-02 (×5): 10 mg via ORAL
  Filled 2017-01-31 (×7): qty 2
  Filled 2017-01-31 (×2): qty 1

## 2017-01-31 MED ORDER — FENTANYL CITRATE (PF) 100 MCG/2ML IJ SOLN
INTRAMUSCULAR | Status: DC | PRN
  Administered 2017-01-31: 50 ug via INTRAVENOUS
  Administered 2017-01-31: 150 ug via INTRAVENOUS
  Administered 2017-01-31: 100 ug via INTRAVENOUS
  Administered 2017-01-31: 50 ug via INTRAVENOUS
  Administered 2017-01-31: 100 ug via INTRAVENOUS
  Administered 2017-01-31: 50 ug via INTRAVENOUS

## 2017-01-31 MED ORDER — EPHEDRINE SULFATE 50 MG/ML IJ SOLN
INTRAMUSCULAR | Status: DC | PRN
  Administered 2017-01-31: 10 mg via INTRAVENOUS

## 2017-01-31 MED ORDER — PROMETHAZINE HCL 25 MG PO TABS: 25.0000 mg | ORAL_TABLET | Freq: Four times a day (QID) | ORAL | 1 refills | Status: DC | PRN

## 2017-01-31 MED ORDER — KETOROLAC TROMETHAMINE 30 MG/ML IJ SOLN
30.0000 mg | Freq: Four times a day (QID) | INTRAMUSCULAR | Status: AC | PRN
  Administered 2017-01-31: 30 mg via INTRAVENOUS
  Filled 2017-01-31: qty 1

## 2017-01-31 MED ORDER — METOCLOPRAMIDE HCL 5 MG PO TABS: 5.0000 mg | ORAL_TABLET | Freq: Three times a day (TID) | ORAL | Status: DC | PRN

## 2017-01-31 MED ORDER — MAGNESIUM CITRATE PO SOLN: 1.0000 | Freq: Once | ORAL | Status: DC | PRN

## 2017-01-31 MED ORDER — ROCURONIUM BROMIDE 100 MG/10ML IV SOLN
INTRAVENOUS | Status: DC | PRN
  Administered 2017-01-31: 50 mg via INTRAVENOUS

## 2017-01-31 MED ORDER — ACETAMINOPHEN 325 MG PO TABS: 650.0000 mg | ORAL_TABLET | Freq: Four times a day (QID) | ORAL | Status: DC | PRN

## 2017-01-31 MED ORDER — SUGAMMADEX SODIUM 200 MG/2ML IV SOLN
INTRAVENOUS | Status: DC | PRN
  Administered 2017-01-31: 250 mg via INTRAVENOUS

## 2017-01-31 MED ORDER — METHOCARBAMOL 750 MG PO TABS: 750.0000 mg | ORAL_TABLET | Freq: Two times a day (BID) | ORAL | 0 refills | Status: DC | PRN

## 2017-01-31 MED ORDER — OXYCODONE HCL 5 MG PO TABS: 5.0000 mg | ORAL_TABLET | Freq: Once | ORAL | Status: DC | PRN

## 2017-01-31 MED ORDER — ONDANSETRON HCL 4 MG PO TABS: 4.0000 mg | ORAL_TABLET | Freq: Four times a day (QID) | ORAL | Status: DC | PRN

## 2017-01-31 MED ORDER — ONDANSETRON HCL 4 MG/2ML IJ SOLN
INTRAMUSCULAR | Status: AC
  Filled 2017-01-31: qty 2

## 2017-01-31 MED ORDER — ONDANSETRON HCL 4 MG/2ML IJ SOLN
4.0000 mg | Freq: Four times a day (QID) | INTRAMUSCULAR | Status: DC | PRN
  Administered 2017-01-31: 4 mg via INTRAVENOUS
  Filled 2017-01-31: qty 2

## 2017-01-31 MED ORDER — DEXMEDETOMIDINE HCL 200 MCG/2ML IV SOLN
INTRAVENOUS | Status: DC | PRN
  Administered 2017-01-31 (×2): 12 ug via INTRAVENOUS

## 2017-01-31 MED ORDER — MIDAZOLAM HCL 2 MG/2ML IJ SOLN
INTRAMUSCULAR | Status: DC | PRN
  Administered 2017-01-31: 2 mg via INTRAVENOUS

## 2017-01-31 MED ORDER — PROPOFOL 10 MG/ML IV BOLUS
INTRAVENOUS | Status: DC | PRN
  Administered 2017-01-31: 50 mg via INTRAVENOUS
  Administered 2017-01-31: 200 mg via INTRAVENOUS

## 2017-01-31 MED ORDER — LIDOCAINE 2% (20 MG/ML) 5 ML SYRINGE
INTRAMUSCULAR | Status: AC
  Filled 2017-01-31: qty 5

## 2017-01-31 MED ORDER — ASPIRIN EC 325 MG PO TBEC: 325.0000 mg | DELAYED_RELEASE_TABLET | Freq: Two times a day (BID) | ORAL | 0 refills | Status: DC

## 2017-01-31 MED ORDER — LABETALOL HCL 5 MG/ML IV SOLN
INTRAVENOUS | Status: AC
  Filled 2017-01-31: qty 4

## 2017-01-31 MED ORDER — METOCLOPRAMIDE HCL 5 MG/ML IJ SOLN: 5.0000 mg | Freq: Three times a day (TID) | INTRAMUSCULAR | Status: DC | PRN

## 2017-01-31 MED ORDER — SODIUM CHLORIDE 0.9 % IV SOLN
INTRAVENOUS | Status: DC
  Administered 2017-01-31 (×2): via INTRAVENOUS

## 2017-01-31 MED ORDER — ACETAMINOPHEN 650 MG RE SUPP: 650.0000 mg | Freq: Four times a day (QID) | RECTAL | Status: DC | PRN

## 2017-01-31 MED ORDER — SORBITOL 70 % SOLN: 30.0000 mL | Freq: Every day | Status: DC | PRN

## 2017-01-31 MED ORDER — POLYETHYLENE GLYCOL 3350 17 G PO PACK: 17.0000 g | PACK | Freq: Every day | ORAL | Status: DC | PRN

## 2017-01-31 MED ORDER — BUPIVACAINE-EPINEPHRINE (PF) 0.5% -1:200000 IJ SOLN
INTRAMUSCULAR | Status: DC | PRN
  Administered 2017-01-31: 30 mL via PERINEURAL
  Administered 2017-01-31: 15 mL via PERINEURAL

## 2017-01-31 MED ORDER — MORPHINE SULFATE (PF) 2 MG/ML IV SOLN
2.0000 mg | INTRAVENOUS | Status: DC | PRN
  Administered 2017-02-01 (×3): 2 mg via INTRAVENOUS
  Filled 2017-01-31 (×4): qty 1

## 2017-01-31 MED ORDER — HYDROMORPHONE HCL 1 MG/ML IJ SOLN: 0.2500 mg | INTRAMUSCULAR | Status: DC | PRN

## 2017-01-31 MED ORDER — OXYCODONE HCL 5 MG/5ML PO SOLN: 5.0000 mg | Freq: Once | ORAL | Status: DC | PRN

## 2017-01-31 MED ORDER — PROPOFOL 10 MG/ML IV BOLUS
INTRAVENOUS | Status: AC
  Filled 2017-01-31: qty 20

## 2017-01-31 MED ORDER — ROCURONIUM BROMIDE 50 MG/5ML IV SOSY
PREFILLED_SYRINGE | INTRAVENOUS | Status: AC
  Filled 2017-01-31: qty 5

## 2017-01-31 MED ORDER — SUGAMMADEX SODIUM 200 MG/2ML IV SOLN
INTRAVENOUS | Status: AC
  Filled 2017-01-31: qty 2

## 2017-01-31 MED ORDER — SUGAMMADEX SODIUM 500 MG/5ML IV SOLN
INTRAVENOUS | Status: AC
  Filled 2017-01-31: qty 5

## 2017-01-31 MED ORDER — ONDANSETRON HCL 4 MG PO TABS: 4.0000 mg | ORAL_TABLET | Freq: Three times a day (TID) | ORAL | 0 refills | Status: DC | PRN

## 2017-01-31 MED ORDER — MIDAZOLAM HCL 2 MG/2ML IJ SOLN
INTRAMUSCULAR | Status: AC
  Filled 2017-01-31: qty 2

## 2017-01-31 MED ORDER — DIPHENHYDRAMINE HCL 12.5 MG/5ML PO ELIX: 25.0000 mg | ORAL_SOLUTION | ORAL | Status: DC | PRN

## 2017-01-31 MED ORDER — CLINDAMYCIN PHOSPHATE 600 MG/50ML IV SOLN
600.0000 mg | Freq: Four times a day (QID) | INTRAVENOUS | Status: AC
  Administered 2017-01-31 – 2017-02-01 (×3): 600 mg via INTRAVENOUS
  Filled 2017-01-31 (×3): qty 50

## 2017-01-31 MED ORDER — PHENYLEPHRINE 40 MCG/ML (10ML) SYRINGE FOR IV PUSH (FOR BLOOD PRESSURE SUPPORT)
PREFILLED_SYRINGE | INTRAVENOUS | Status: AC
  Filled 2017-01-31: qty 10

## 2017-01-31 MED ORDER — PHENYLEPHRINE HCL 10 MG/ML IJ SOLN
INTRAMUSCULAR | Status: DC | PRN
  Administered 2017-01-31: 80 ug via INTRAVENOUS
  Administered 2017-01-31: 120 ug via INTRAVENOUS
  Administered 2017-01-31: 80 ug via INTRAVENOUS
  Administered 2017-01-31: 120 ug via INTRAVENOUS

## 2017-01-31 MED ORDER — LACTATED RINGERS IV SOLN: INTRAVENOUS | Status: DC

## 2017-01-31 MED ORDER — LIDOCAINE HCL (CARDIAC) 20 MG/ML IV SOLN
INTRAVENOUS | Status: DC | PRN
  Administered 2017-01-31: 100 mg via INTRATRACHEAL

## 2017-01-31 MED ORDER — METHOCARBAMOL 1000 MG/10ML IJ SOLN
500.0000 mg | Freq: Four times a day (QID) | INTRAVENOUS | Status: DC | PRN
  Filled 2017-01-31: qty 5

## 2017-01-31 MED ORDER — SODIUM CHLORIDE 0.9 % IR SOLN
Status: DC | PRN
  Administered 2017-01-31: 3000 mL

## 2017-01-31 MED ORDER — ASPIRIN EC 325 MG PO TBEC
325.0000 mg | DELAYED_RELEASE_TABLET | Freq: Two times a day (BID) | ORAL | Status: DC
  Administered 2017-01-31 – 2017-02-02 (×4): 325 mg via ORAL
  Filled 2017-01-31 (×4): qty 1

## 2017-01-31 MED ORDER — METHOCARBAMOL 500 MG PO TABS
500.0000 mg | ORAL_TABLET | Freq: Four times a day (QID) | ORAL | Status: DC | PRN
  Administered 2017-02-01 – 2017-02-02 (×6): 500 mg via ORAL
  Filled 2017-01-31 (×6): qty 1

## 2017-01-31 SURGICAL SUPPLY — 72 items
BANDAGE ELASTIC 4 VELCRO ST LF (GAUZE/BANDAGES/DRESSINGS) ×3 IMPLANT
BANDAGE ELASTIC 6 VELCRO ST LF (GAUZE/BANDAGES/DRESSINGS) ×3 IMPLANT
BANDAGE ESMARK 6X9 LF (GAUZE/BANDAGES/DRESSINGS) ×1 IMPLANT
BIT DRILL 3.5X122MM AO FIT (BIT) ×3 IMPLANT
BLADE SURG 15 STRL LF DISP TIS (BLADE) ×1 IMPLANT
BLADE SURG 15 STRL SS (BLADE) ×2
BNDG COHESIVE 4X5 TAN STRL (GAUZE/BANDAGES/DRESSINGS) ×3 IMPLANT
BNDG ESMARK 6X9 LF (GAUZE/BANDAGES/DRESSINGS) ×3
BNDG GAUZE ELAST 4 BULKY (GAUZE/BANDAGES/DRESSINGS) ×6 IMPLANT
CANISTER SUCT 3000ML PPV (MISCELLANEOUS) ×3 IMPLANT
COVER SURGICAL LIGHT HANDLE (MISCELLANEOUS) ×3 IMPLANT
CUFF TOURNIQUET SINGLE 44IN (TOURNIQUET CUFF) ×3 IMPLANT
DRAPE C-ARM 42X72 X-RAY (DRAPES) ×3 IMPLANT
DRAPE C-ARMOR (DRAPES) ×3 IMPLANT
DRAPE INCISE IOBAN 66X45 STRL (DRAPES) ×3 IMPLANT
DRAPE U-SHAPE 47X51 STRL (DRAPES) ×3 IMPLANT
DRILL 2.6X122MM WL AO SHAFT (BIT) ×3 IMPLANT
DURAPREP 26ML APPLICATOR (WOUND CARE) ×6 IMPLANT
ELECT CAUTERY BLADE 6.4 (BLADE) ×3 IMPLANT
ELECT REM PT RETURN 9FT ADLT (ELECTROSURGICAL) ×3
ELECTRODE REM PT RTRN 9FT ADLT (ELECTROSURGICAL) ×1 IMPLANT
FLUID NSS /IRRIG 3000 ML XXX (IV SOLUTION) ×3 IMPLANT
GAUZE SPONGE 4X4 12PLY STRL LF (GAUZE/BANDAGES/DRESSINGS) ×3 IMPLANT
GAUZE XEROFORM 5X9 LF (GAUZE/BANDAGES/DRESSINGS) ×3 IMPLANT
GLOVE BIO SURGEON STRL SZ 6.5 (GLOVE) ×4 IMPLANT
GLOVE BIO SURGEONS STRL SZ 6.5 (GLOVE) ×2
GLOVE BIOGEL PI IND STRL 6.5 (GLOVE) ×3 IMPLANT
GLOVE BIOGEL PI IND STRL 7.5 (GLOVE) ×1 IMPLANT
GLOVE BIOGEL PI INDICATOR 6.5 (GLOVE) ×6
GLOVE BIOGEL PI INDICATOR 7.5 (GLOVE) ×2
GLOVE SKINSENSE NS SZ7.5 (GLOVE) ×2
GLOVE SKINSENSE STRL SZ7.5 (GLOVE) ×1 IMPLANT
GLOVE SURG SS PI 8.0 STRL IVOR (GLOVE) ×9 IMPLANT
GLOVE SURG SYN 7.5  E (GLOVE) ×4
GLOVE SURG SYN 7.5 E (GLOVE) ×2 IMPLANT
GOWN STRL REIN XL XLG (GOWN DISPOSABLE) ×3 IMPLANT
HANDPIECE INTERPULSE COAX TIP (DISPOSABLE) ×2
K-WIRE 1.6X150 (WIRE) ×4
K-WIRE FX150X1.6XKRSH (WIRE) ×2
KIT BASIN OR (CUSTOM PROCEDURE TRAY) ×3 IMPLANT
KIT ROOM TURNOVER OR (KITS) ×3 IMPLANT
KWIRE FX150X1.6XKRSH (WIRE) ×2 IMPLANT
NS IRRIG 1000ML POUR BTL (IV SOLUTION) ×3 IMPLANT
PACK ORTHO EXTREMITY (CUSTOM PROCEDURE TRAY) ×3 IMPLANT
PAD ABD 8X10 STRL (GAUZE/BANDAGES/DRESSINGS) ×6 IMPLANT
PAD ARMBOARD 7.5X6 YLW CONV (MISCELLANEOUS) ×6 IMPLANT
PADDING CAST ABS 4INX4YD NS (CAST SUPPLIES) ×2
PADDING CAST ABS 6INX4YD NS (CAST SUPPLIES) ×2
PADDING CAST ABS COTTON 4X4 ST (CAST SUPPLIES) ×1 IMPLANT
PADDING CAST ABS COTTON 6X4 NS (CAST SUPPLIES) ×1 IMPLANT
PADDING CAST COTTON 6X4 STRL (CAST SUPPLIES) ×3 IMPLANT
PLATE DISTAL FIBULA 3HOLE (Plate) ×3 IMPLANT
SCREW BONE 14MMX3.5MM (Screw) ×6 IMPLANT
SCREW BONE 18 (Screw) ×3 IMPLANT
SCREW BONE 3.5X16MM (Screw) ×6 IMPLANT
SCREW LOCK 3.5X14 (Screw) ×6 IMPLANT
SCREW NONLOCK 22MM (Screw) ×3 IMPLANT
SET HNDPC FAN SPRY TIP SCT (DISPOSABLE) ×1 IMPLANT
SPLINT FIBERGLASS 3X35 (CAST SUPPLIES) ×3 IMPLANT
SPLINT FIBERGLASS 4X30 (CAST SUPPLIES) ×3 IMPLANT
SUCTION FRAZIER HANDLE 10FR (MISCELLANEOUS) ×2
SUCTION TUBE FRAZIER 10FR DISP (MISCELLANEOUS) ×1 IMPLANT
SUT ETHILON 3 0 PS 1 (SUTURE) ×6 IMPLANT
SUT VIC AB 0 CT1 27 (SUTURE) ×2
SUT VIC AB 0 CT1 27XBRD ANBCTR (SUTURE) ×1 IMPLANT
SUT VIC AB 2-0 CT1 27 (SUTURE) ×2
SUT VIC AB 2-0 CT1 TAPERPNT 27 (SUTURE) ×1 IMPLANT
TOWEL OR 17X24 6PK STRL BLUE (TOWEL DISPOSABLE) ×3 IMPLANT
TOWEL OR 17X26 10 PK STRL BLUE (TOWEL DISPOSABLE) ×6 IMPLANT
TUBE CONNECTING 12'X1/4 (SUCTIONS) ×1
TUBE CONNECTING 12X1/4 (SUCTIONS) ×2 IMPLANT
UNDERPAD 30X30 (UNDERPADS AND DIAPERS) ×3 IMPLANT

## 2017-01-31 NOTE — Op Note (Signed)
   Date of Surgery: 01/31/2017  INDICATIONS: [name] is a 26 y.o.-year-old male who sustained a right ankle fracture; he was indicated for open reduction and internal fixation due to the displaced nature of the articular fracture and came to the operating room today for this procedure. The patient did consent to the procedure after discussion of the risks and benefits.  PREOPERATIVE DIAGNOSIS: right trimalleolar ankle fracture  POSTOPERATIVE DIAGNOSIS: Same.  PROCEDURE: Open treatment of right ankle fracture with internal fixation. Trimalleolar w/o fixation of posterior malleolus CPT 27822..  SURGEON: George [name], M.D.  ASSIST: George [name], RNFA.  ANESTHESIA:  general  TOURNIQUET TIME: less than 60 mins  IV FLUIDS AND URINE: See anesthesia.  ESTIMATED BLOOD LOSS: minimal mL.  IMPLANTS: Stryker variax  COMPLICATIONS: None.  DESCRIPTION OF PROCEDURE: The patient was brought to the operating room and placed supine on the operating table.  The patient had been signed prior to the procedure and this was documented. The patient had the anesthesia placed by the anesthesiologist.  A nonsterile tourniquet was placed on the upper thigh.  The prep verification and incision time-outs were performed to confirm that this was the correct patient, site, side and location. The patient had an SCD on the opposite lower extremity. The patient did receive antibiotics prior to the incision and was re-dosed during the procedure as needed at indicated intervals.  The patient had the lower extremity prepped and draped in the standard surgical fashion.  The extremity was exsanguinated using an esmarch bandage and the tourniquet was inflated to 300 mm Hg.  Incision was made on the lateral aspect of the distal fibula. Dissection was carried down to the fascia. Fascia was sharply incised incision. Subperiosteal elevation was then performed. The fracture was exposed. Organized hematoma and immature callus was  removed. The fracture was immobilized and then reduced and held with a clamp. An anterior to posterior lag screw was placed using standard AO technique. This gave good compression across the fracture. We then placed a precontoured plate on the lateral aspect of the fibula in neutralization fashion. Nonlocking and then unicortical locking screws were placed in standard AO technique. Final x-rays were taken. Stress view showed no opening of the medial clear space. The wound was then thoroughly irrigated and closed in layer fashion using 0 Vicryl, 2-0 Vicryl, 3-0 nylon. Sterile dressings were applied. Foot was immobilized in a short leg splint. Patient tolerated the procedure well and no immediate complications  POSTOPERATIVE PLAN: [name] will remain nonweightbearing on this leg for approximately 6 weeks; [name] will return for suture removal in 2 weeks.  He will be immobilized in a short leg splint and then transitioned to a CAM walker at his first follow up appointment.  [name] will receive DVT prophylaxis based on other medications, activity level, and risk ratio of bleeding to thrombosis.  George ReelN. George Nakea Gouger, MD [facility] [phone] 1:15 PM

## 2017-01-31 NOTE — Discharge Instructions (Signed)
° ° °  1. Keep splint clean and dry °2. Elevate foot above level of the heart °3. Take aspirin to prevent blood clots °4. Take pain meds as needed °5. Strict non weight bearing to operative extremity ° °

## 2017-01-31 NOTE — Anesthesia Procedure Notes (Signed)
Anesthesia Regional Block: Popliteal block   Pre-Anesthetic Checklist: ,, timeout performed, Correct Patient, Correct Site, Correct Laterality, Correct Procedure, Correct Position, site marked, Risks and benefits discussed,  Surgical consent,  Pre-op evaluation,  At surgeon's request and post-op pain management  Laterality: Lower and Right  Prep: chloraprep       Needles:  Injection technique: Single-shot  Needle Type: Echogenic Stimulator Needle          Additional Needles:   Procedures: ultrasound guided,,,,,,,,  Narrative:  Start time: 01/31/2017 12:14 PM End time: 01/31/2017 12:18 PM Injection made incrementally with aspirations every 5 mL.  Performed by: Personally  Anesthesiologist: Alcee Sipos  Additional Notes: H+P and labs reviewed, risks and benefits discussed with patient, procedure tolerated well without complications

## 2017-01-31 NOTE — Anesthesia Procedure Notes (Signed)
Anesthesia Regional Block: Adductor canal block   Pre-Anesthetic Checklist: ,, timeout performed, Correct Patient, Correct Site, Correct Laterality, Correct Procedure, Correct Position, site marked, Risks and benefits discussed,  Surgical consent,  Pre-op evaluation,  At surgeon's request and post-op pain management  Laterality: Lower and Right  Prep: chloraprep       Needles:  Injection technique: Single-shot  Needle Type: Echogenic Stimulator Needle          Additional Needles:   Procedures: ultrasound guided,,,,,,,,  Narrative:  Start time: 01/31/2017 12:14 PM End time: 01/31/2017 12:18 PM Injection made incrementally with aspirations every 5 mL.  Performed by: Personally  Anesthesiologist: Stetson Pelaez  Additional Notes: H+P and labs reviewed, risks and benefits discussed with patient, procedure tolerated well without complications

## 2017-01-31 NOTE — Anesthesia Procedure Notes (Signed)
Procedure Name: Intubation Date/Time: 01/31/2017 12:27 PM Performed by: Rosiland OzMEYERS, George Selsor Pre-anesthesia Checklist: Patient identified, Emergency Drugs available, Patient being monitored, Suction available and Timeout performed Patient Re-evaluated:Patient Re-evaluated prior to inductionOxygen Delivery Method: Circle system utilized Preoxygenation: Pre-oxygenation with 100% oxygen Intubation Type: IV induction Ventilation: Mask ventilation without difficulty Laryngoscope Size: 3 and Miller Grade View: Grade I Tube type: Oral Tube size: 7.5 mm Number of attempts: 1 Airway Equipment and Method: Stylet Placement Confirmation: ETT inserted through vocal cords under direct vision,  positive ETCO2 and breath sounds checked- equal and bilateral Secured at: 21 cm Tube secured with: Tape Dental Injury: Teeth and Oropharynx as per pre-operative assessment

## 2017-01-31 NOTE — Transfer of Care (Signed)
Immediate Anesthesia Transfer of Care Note  Patient: George [name]  Procedure(s) Performed: Procedure(s): OPEN REDUCTION INTERNAL FIXATION (ORIF) RIGHT TRIMALLEOLAR ANKLE FRACTURE (Right)  Patient Location: PACU  Anesthesia Type:General  Level of Consciousness: sedated and patient cooperative  Airway & Oxygen Therapy: Patient Spontanous Breathing  Post-op Assessment: Report given to RN and Post -op Vital signs reviewed and stable  Post vital signs: Reviewed and stable  Last Vitals:  Vitals:   01/31/17 1213 01/31/17 1350  BP: (!) 159/77   Pulse: 73   Resp: 14   Temp:  36.1 C    Last Pain:  Vitals:   01/31/17 0849  TempSrc:   PainSc: 0-No pain      Patients Stated Pain Goal: 1 (01/27/17 0830)  Complications: No apparent anesthesia complications

## 2017-02-01 ENCOUNTER — Encounter (HOSPITAL_COMMUNITY): Payer: Self-pay

## 2017-02-01 NOTE — Progress Notes (Addendum)
   Subjective:  Patient reports pain as moderate.    Objective:   VITALS:   Vitals:   01/31/17 1500 01/31/17 2041 02/01/17 0030 02/01/17 0453  BP:  122/70 125/67 104/63  Pulse:  80 75 90  Resp:  15 15 20   Temp: 98.4 F (36.9 C) 98.4 F (36.9 C) 97.9 F (36.6 C) 99.3 F (37.4 C)  TempSrc:  Oral Oral Oral  SpO2:  96% 96% 97%  Weight:      Height:        Neurologically intact Neurovascular intact Sensation intact distally Intact pulses distally Dorsiflexion/Plantar flexion intact Incision: dressing C/D/I and no drainage No cellulitis present Compartment soft   Lab Results  Component Value Date   WBC 9.5 01/26/2017   HGB 12.6 (L) 01/26/2017   HCT 39.8 01/26/2017   MCV 85.2 01/26/2017   PLT 305 01/26/2017     Assessment/Plan:  1 Day Post-Op   - Expected postop acute blood loss anemia - will monitor for symptoms - Up with PT/OT - DVT ppx - SCDs, ambulation, aspirin - NWB operative extremity - Pain control - patient is homeless - appreciate social worker assistance  - stable for discharge whenever he has a place to go   Edison InternationalMichael Xu 02/01/2017, 7:39 AM [phone]

## 2017-02-01 NOTE — Evaluation (Signed)
Occupational Therapy Evaluation Patient Details Name: George [name] MRN: [id number] DOB: 1991/06/29 Today's Date: 02/01/2017    History of Present Illness Pt is a 26 yo male with R trimalleolar fx resulting from falling off skateboard, s/p 01/31/17 ORIF and is NWB. PMH includes, ADHD, anxiety, bipolar disorder and depression.   Clinical Impression   This 26 yo male admitted and underwent above presents to acute OT with deficits below (see OT problem list) thus affecting his PLOF of being totally independent with basic ADLs, working, and driving. He will benefit from acute OT with follow up at SNF to get to a Mod I level.    Follow Up Recommendations  SNF;Supervision/Assistance - 24 hour    Equipment Recommendations  Other (comment) (TBD at next venue)       Precautions / Restrictions Precautions Precautions: Fall Required Braces or Orthoses:  (casted/splinted RLE) Restrictions Weight Bearing Restrictions: Yes RLE Weight Bearing: Non weight bearing      Mobility Bed Mobility Overal bed mobility: Needs Assistance Bed Mobility: Sit to Supine       Sit to supine: Supervision   General bed mobility comments: HOB flat with rail  Transfers Overall transfer level: Needs assistance Equipment used: Rolling walker (2 wheeled) Transfers: Sit to/from [employer] Sit to Stand: Min guard Stand pivot transfers: Min assist            Balance Overall balance assessment: Needs assistance Sitting-balance support: No upper extremity supported;Feet supported Sitting balance-Leahy Scale: Normal     Standing balance support: Bilateral upper extremity supported;During functional activity Standing balance-Leahy Scale: Poor Standing balance comment: Bil UE on RW                           ADL either performed or assessed with clinical judgement   ADL Overall ADL's : Needs assistance/impaired Eating/Feeding: Independent;Sitting   Grooming: Set  up;Sitting   Upper Body Bathing: Set up;Sitting   Lower Body Bathing: Moderate assistance (min guard A sit<>stand)   Upper Body Dressing : Set up;Sitting   Lower Body Dressing: Moderate assistance (min guard A sit<>stand)   Toilet Transfer: Minimal assistance;Stand-pivot;RW Statistician Details (indicate cue type and reason): recliner>bed Toileting- Clothing Manipulation and Hygiene: Minimal assistance (min guard A sit<>stand)               Vision Patient Visual Report: No change from baseline              Pertinent Vitals/Pain Pain Assessment: 0-10 Pain Score: 10-Worst pain ever Pain Location: R ankle Pain Descriptors / Indicators: Pressure;Grimacing (wrighing) Pain Intervention(s): Premedicated before session;Limited activity within patient's tolerance;Monitored during session;Repositioned     Hand Dominance Right   Extremity/Trunk Assessment Upper Extremity Assessment Upper Extremity Assessment: Overall WFL for tasks assessed           Communication Communication Communication: No difficulties   Cognition Arousal/Alertness: Awake/alert Behavior During Therapy: Restless Overall Cognitive Status: Within Functional Limits for tasks assessed                                                Home Living Family/patient expects to be discharged to:: Unsure Living Arrangements: Parent (but cannot go back there)  Prior Functioning/Environment Level of Independence: Independent        Comments: employed part time, driving        OT Problem List: Decreased range of motion;Impaired balance (sitting and/or standing);Pain;Decreased safety awareness;Decreased knowledge of use of DME or AE      OT Treatment/Interventions: Self-care/ADL training;Patient/family education;DME and/or AE instruction;Balance training    OT Goals(Current goals can be found in the care plan section) Acute Rehab  OT Goals Patient Stated Goal: to get out of hospital OT Goal Formulation: With patient Time For Goal Achievement: 02/15/17 Potential to Achieve Goals: Good  OT Frequency: Min 2X/week   Barriers to D/C: Decreased caregiver support             End of Session Equipment Utilized During Treatment: Engineer, waterolling walker Nurse Communication:  (pt still c/o pain--I went in with RN to unwrap and re-wrap ace wraps but this did not help pt's pain; RN still trying to get in touch with MD)  Activity Tolerance: Patient limited by pain Patient left: in bed;with call bell/phone within reach;with family/visitor present  OT Visit Diagnosis: Unsteadiness on feet (R26.81);Pain Pain - Right/Left: Right Pain - part of body: Ankle and joints of foot                Time: [id number] OT Time Calculation (min): 29 min Charges:  OT General Charges $OT Visit: 1 Procedure OT Evaluation $OT Eval Moderate Complexity: 1 Procedure OT Treatments $Self Care/Home Management : 8-22 mins Ignacia PalmaCathy Ashleyann Shoun, OTR/L [phone] 02/01/2017

## 2017-02-01 NOTE — Clinical Social Work Note (Signed)
Clinical Social Work Assessment  Patient Details  Name: George [name] MRN: [id number] Date of Birth: 1991/03/21  Date of referral:  02/01/17               Reason for consult:  Discharge Planning                Permission sought to share information with:  Family Supports Permission granted to share information::  Yes, Verbal Permission Granted  Name::        Agency::     Relationship::     Contact Information:     Housing/Transportation Living arrangements for the past 2 months:    Source of Information:    Patient Interpreter Needed:  None Criminal Activity/Legal Involvement Pertinent to Current Situation/Hospitalization:    Significant Relationships:  Parents Lives with:  Other (Comment) (Homeless; was living with parents but unable to return) Do you feel safe going back to the place where you live?  No Need for family participation in patient care:  Yes (Comment)  Care giving concerns: No caregiving concerns identified.    Social Worker assessment / plan:  CSW met with pt to address consult for homelessness/MH concerns. CSW introduced herself and explained role of social work. Mother and god-mother present. Per mother, pt has ben IVCed several times and has tried to fight parents. Mom states pt cannot return to their home. Pt was living in their garage, but can no longer. Pt has nowhere else to go. Pt became angry and asked mother and god mother to leave mid-discussion because he did not agree with what they were saying. Pt began to become verbally aggressive as he expressed his anger towards parents and the world in general. CSW was abel to deescalate pt by allowing him to voice his frustrations. CSW and pt briefly discussed triggers and coping skills. Pt disclosed he stopped taking psychotropic meds most recently because he did not have money to pay for them.   CSW discussed with pt possible d/c plans. Pt does not have any income at this time, but says that he is part-time/temp  at [employer]. CSW was clearly stated that public housing has 2-4 years waiting lists and would not be a d/c option. CSW discussed providing resources for public and low-income house. CSW and pt also discussed applying for disability, group home placement, and shelter options.   CSW to pursue LOG SNF placement as instructed by [facility] Zack. FL-2 completed and faxed out. PASRR pending. CSW will continue to follow for d/c needs.    Employment status:  Temporary, Unemployed Insurance information:  Self Pay (Medicaid Pending) PT Recommendations:  [place] / Referral to community resources:  [place]  Patient/Family's Response to care:  Pt and mom appreciative of CSW assistance and support.   Patient/Family's Understanding of and Emotional Response to Diagnosis, Current Treatment, and Prognosis:  Pt is aware of diagnosis, current treatment plan, and prognosis.   Emotional Assessment Appearance:  Appears stated age Attitude/Demeanor/Rapport:  Aggressive (Verbally and/or physically), Angry, Crying, Hostile Affect (typically observed):  Angry, Tearful/Crying, Irritable, Agitated, Defensive Orientation:  Oriented to Self, Oriented to Place, Oriented to  Time, Oriented to Situation Alcohol / Substance use:  Other Psych involvement (Current and /or in the community):  Yes (Comment) (Psych eval completed 01/30/17)  Discharge Needs  Concerns to be addressed:  Care Coordination, Homelessness, Mental Health Concerns, Medication Concerns Readmission within the last 30 days:  No Current discharge risk:  Homeless Barriers to Discharge:  Continued Medical Work up   [employer], LCSW 02/01/2017, 2:51 PM

## 2017-02-01 NOTE — NC FL2 (Signed)
[place] MEDICAID FL2 LEVEL OF CARE SCREENING TOOL     IDENTIFICATION  Patient Name: George [name] Birthdate: Mar 13, 1991 Sex: male Admission Date (Current Location): 01/26/2017  [facility] [place]   Facility and Address:  [facility], [address], [place], [place] [address]      Provider Number: [id number]  Attending Physician Name and Address:  Tarry KosNaiping M Xu, MD  Relative Name and Phone Number:       Current Level of Care: Hospital Recommended Level of Care: Skilled Nursing Facility Prior Approval Number:    Date Approved/Denied:   PASRR Number:    Discharge Plan: SNF    Current Diagnoses: Patient Active Problem List   Diagnosis Date Noted  . Bipolar 1 disorder, mixed, moderate (HCC) 01/30/2017  . Trimalleolar fracture of ankle, closed, right, initial encounter 01/26/2017  . Displaced trimalleolar fracture of right lower leg, initial encounter for closed fracture 01/22/2017  . Paranoid schizophrenia (HCC)   . Schizophrenia (HCC) 12/08/2015    Orientation RESPIRATION BLADDER Height & Weight     Self, Time, Situation, Place  Normal Continent Weight: 243 lb (110.2 kg) Height:  5\' 10"  (177.8 cm)  BEHAVIORAL SYMPTOMS/MOOD NEUROLOGICAL BOWEL NUTRITION STATUS      Continent Diet (Regular)  AMBULATORY STATUS COMMUNICATION OF NEEDS Skin   Supervision Verbally Normal                       Personal Care Assistance Level of Assistance  Bathing, Feeding, Dressing Bathing Assistance: Limited assistance Feeding assistance: Independent Dressing Assistance: Limited assistance     Functional Limitations Info  Sight, Hearing, Speech Sight Info: Adequate Hearing Info: Adequate Speech Info: Adequate    SPECIAL CARE FACTORS FREQUENCY  PT (By licensed PT)     PT Frequency: 5x              Contractures Contractures Info: Not present    Additional Factors Info  Code Status, Allergies, Psychotropic Code  Status Info: Full Allergies Info: Amoxicillin, Penicillins Psychotropic Info: See med list         Current Medications (02/01/2017):  This is the current hospital active medication list Current Facility-Administered Medications  Medication Dose Route Frequency Provider Last Rate Last Dose  . 0.9 %  sodium chloride infusion   Intravenous Continuous Naiping Donnelly StagerM Xu, MD 10 mL/hr at 02/01/17 0955    . acetaminophen (TYLENOL) tablet 650 mg  650 mg Oral Q6H PRN Naiping Donnelly StagerM Xu, MD       Or  . acetaminophen (TYLENOL) suppository 650 mg  650 mg Rectal Q6H PRN Tarry KosNaiping M Xu, MD      . aspirin EC tablet 325 mg  325 mg Oral BID Tarry KosNaiping M Xu, MD   325 mg at 02/01/17 0848  . calcium carbonate (TUMS - dosed in mg elemental calcium) chewable tablet 800 mg of elemental calcium  4 tablet Oral PRN Tarry KosNaiping M Xu, MD      . Chlorhexidine Gluconate Cloth 2 % PADS 6 each  6 each Topical Q0600 Tarry KosNaiping M Xu, MD   6 each at 02/01/17 [phone]  . diphenhydrAMINE (BENADRYL) 12.5 MG/5ML elixir 25 mg  25 mg Oral Q4H PRN Tarry KosNaiping M Xu, MD      . hydrOXYzine (ATARAX/VISTARIL) tablet 25 mg  25 mg Oral TID PRN Tarry KosNaiping M Xu, MD   25 mg at 01/30/17 0942  . ibuprofen (ADVIL,MOTRIN) tablet 800 mg  800 mg Oral Q6H PRN  [name], MD   800 mg at 01/30/17 1351  . ketorolac (TORADOL) 30 MG/ML injection 30 mg  30 mg Intravenous Q6H PRN [name], MD   30 mg at 01/31/17 1857  . lactated ringers infusion   Intravenous Continuous Cecile Hearing, MD   Stopped at 01/31/17 1350  . lactated ringers infusion   Intravenous Continuous Naiping Donnelly Stager, MD      . magnesium citrate solution 1 Bottle  1 Bottle Oral Once PRN Naiping Donnelly Stager, MD      . methocarbamol (ROBAXIN) tablet 500 mg  500 mg Oral Q6H PRN [name], MD   500 mg at 02/01/17 1610   Or  . methocarbamol (ROBAXIN) 500 mg in dextrose 5 % 50 mL IVPB  500 mg Intravenous Q6H PRN Naiping Donnelly Stager, MD      . metoCLOPramide (REGLAN) tablet 5-10 mg  5-10 mg Oral Q8H PRN Naiping Donnelly Stager, MD       Or   . metoCLOPramide (REGLAN) injection 5-10 mg  5-10 mg Intravenous Q8H PRN Naiping Donnelly Stager, MD      . morphine 2 MG/ML injection 2 mg  2 mg Intravenous Q2H PRN [name], MD   2 mg at 02/01/17 0523  . mupirocin ointment (BACTROBAN) 2 % 1 application  1 application Nasal BID Naiping Donnelly Stager, MD   1 application at 02/01/17 0900  . ondansetron (ZOFRAN) tablet 4 mg  4 mg Oral Q6H PRN Naiping Donnelly Stager, MD       Or  . ondansetron [facility]) injection 4 mg  4 mg Intravenous Q6H PRN [name], MD   4 mg at 01/31/17 1453  . OXcarbazepine (TRILEPTAL) tablet 150 mg  150 mg Oral BID [name], MD   150 mg at 02/01/17 0900  . oxyCODONE (Oxy IR/ROXICODONE) immediate release tablet 5-15 mg  5-15 mg Oral Q3H PRN [name], MD   5 mg at 02/01/17 1417  . polyethylene glycol (MIRALAX / GLYCOLAX) packet 17 g  17 g Oral Daily PRN [name], MD      . QUEtiapine (SEROQUEL) tablet 100 mg  100 mg Oral Daily Beau Fanny, FNP   100 mg at 02/01/17 0954  . QUEtiapine (SEROQUEL) tablet 200 mg  200 mg Oral QHS Beau Fanny, FNP   200 mg at 01/31/17 2302  . sorbitol 70 % solution 30 mL  30 mL Oral Daily PRN Naiping Donnelly Stager, MD         Discharge Medications: Please see discharge summary for a list of discharge medications.  Relevant Imaging Results:  Relevant Lab Results:   Additional Information SSN: [id number]    Bipolar 1 disorder, mixed, moderate; schizophrenia, paranoide schizophrenia  Dominic Pea, LCSW

## 2017-02-01 NOTE — Progress Notes (Signed)
Physical Therapy Evaluation Patient Details Name: George [name] MRN: [id number] DOB: 03/02/1991 Today's Date: 02/01/2017   History of Present Illness  Pt is a 26 yo male with R trimalleolar fx resulting from falling off skateboard, s/p 01/31/17 ORIF and is NWB. PMH includes, ADHD, anxiety, bipolar disorder and depression.  Clinical Impression  Patient is s/p above surgery resulting in functional limitations due to the deficits listed below (see PT Problem List). Pt is mod I in bed mobility, min guard for transfers and minA for ambulation of 10 feet with RW. Pt able to maintain NWB on R LE throughout session. Patient will benefit from skilled PT to increase their independence and safety with mobility to allow discharge to the venue listed below.      Follow Up Recommendations Supervision/Assistance - 24 hour;Home health PT    Equipment Recommendations  Rolling walker with 5" wheels;3in1 (PT)    Recommendations for Other Services OT consult     Precautions / Restrictions Precautions Precautions: Other (comment) (trimalleolar fx ) Required Braces or Orthoses:  (Splint) Restrictions Weight Bearing Restrictions: Yes RLE Weight Bearing: Non weight bearing      Mobility  Bed Mobility Overal bed mobility: Modified Independent;Needs Assistance Bed Mobility: Supine to Sit     Supine to sit: HOB elevated     General bed mobility comments: used bed rails and HoB elevated to come to seated EoB  Transfers Overall transfer level: Needs assistance   Transfers: Sit to/from Stand Sit to Stand: Min guard (to RW)         General transfer comment: Good powerup and able to maintain NWB   Ambulation/Gait Ambulation/Gait assistance: Min assist Ambulation Distance (Feet): 10 Feet Assistive device: Rolling walker (2 wheeled) Gait Pattern/deviations: Trunk flexed (hop to pattern to maintain NWB) Gait velocity: slowed Gait velocity interpretation: Below normal speed for  age/gender General Gait Details: maintained NWB vc for rolling walker instead of picking it up and for eccentric lowering of L LE to reduce stress on knee Pt UE tired quickly and he returned to recliner    Balance Overall balance assessment: Needs assistance Sitting-balance support: No upper extremity supported Sitting balance-Leahy Scale: Normal     Standing balance support: Single extremity supported Standing balance-Leahy Scale: Fair Standing balance comment: required at least single UE support to maintain balance and NWB                             Pertinent Vitals/Pain Pain Assessment: 0-10 Pain Score: 5  Pain Location: R ankle Pain Descriptors / Indicators: Burning;Tingling;Grimacing;Guarding Pain Intervention(s): Premedicated before session;Monitored during session;Limited activity within patient's tolerance  VSS    Home Living Family/patient expects to be discharged to:: Unsure                      Prior Function Level of Independence: Independent         Comments: employed, driving     Higher education careers adviser        Extremity/Trunk Assessment   Upper Extremity Assessment Upper Extremity Assessment: Defer to OT evaluation    Lower Extremity Assessment Lower Extremity Assessment: RLE deficits/detail RLE Deficits / Details: R knee and ankle ROM limited by splinting RLE: Unable to fully assess due to immobilization RLE Sensation: decreased light touch (numbness and tingling possibly from splint pressure  )    Cervical / Trunk Assessment Cervical / Trunk Assessment: Normal  Communication   Communication: No  difficulties  Cognition Arousal/Alertness: Awake/alert Behavior During Therapy: WFL for tasks assessed/performed Overall Cognitive Status: Within Functional Limits for tasks assessed                                        General Comments General comments (skin integrity, edema, etc.): Pt complaints of the splint rubbing  on his popliteal fossa especially when LE elevated, pt also indicated a pins and needles feeling in his calf and toes. Nursing notified as splint may be pressing on tibial nerve    Exercises General Exercises - Lower Extremity Ankle Circles/Pumps: AROM;20 reps;Both;Seated Quad Sets: AROM;10 reps;Both;Seated Gluteal Sets: AROM;Both;10 reps;Seated   Assessment/Plan    PT Assessment Patient needs continued PT services  PT Problem List Decreased range of motion;Decreased activity tolerance;Decreased mobility;Decreased knowledge of use of DME;Pain;Obesity;Impaired sensation       PT Treatment Interventions DME instruction;Gait training;Stair training;Functional mobility training;Therapeutic activities;Therapeutic exercise;Patient/family education    PT Goals (Current goals can be found in the Care Plan section)  Acute Rehab PT Goals Patient Stated Goal: to get out of hospital PT Goal Formulation: With patient Time For Goal Achievement: 02/08/17 Potential to Achieve Goals: Good    Frequency Min 6X/week   Barriers to discharge  (unsure of discharge location)         End of Session Equipment Utilized During Treatment: Gait belt Activity Tolerance: Patient limited by fatigue Patient left: in chair;with call bell/phone within reach Nurse Communication: Mobility status (pt continued complaints of splint compression ) PT Visit Diagnosis: Other abnormalities of gait and mobility (R26.89);Difficulty in walking, not elsewhere classified (R26.2);Pain Pain - Right/Left: Right Pain - part of body: Ankle and joints of foot    Time: [id number] PT Time Calculation (min) (ACUTE ONLY): 41 min   Charges:   PT Evaluation $PT Eval Moderate Complexity: 1 Procedure PT Treatments $Gait Training: 8-22 mins $Therapeutic Activity: 8-22 mins   PT G Codes:        Consuela Widener B. Beverely RisenVan Fleet PT, DPT Acute Rehabilitation  [phone] Pager [phone]    Elon Alaslizabeth B Van Fleet 02/01/2017,  11:53 AM

## 2017-02-02 ENCOUNTER — Encounter (HOSPITAL_COMMUNITY): Payer: Self-pay | Admitting: Orthopaedic Surgery

## 2017-02-02 NOTE — Clinical Social Work Note (Deleted)
CSW staffed case with CSW Chiropodist and at this time he will not offer pt a LOG. CSW will speak with pt about making arrangements for d/c.   [address], [place] [phone]

## 2017-02-02 NOTE — Clinical Social Work Note (Signed)
At this time we will not offer pt a LOG. CSW spoke with pt. Pt is aware and is accepting to a shelter list. CSW will provide a list of shelters in the area.  [address], [place] [phone]

## 2017-02-02 NOTE — Progress Notes (Signed)
Patient declined shelter at this time; patient's mother agreed to take patient home.  Front wheel walker and bedside commode provided to patient at time of discharge.  IV removed.  Engineering geologist aware of the situation.  Mother to drive patient to her home.

## 2017-02-02 NOTE — Progress Notes (Signed)
qPhysical Therapy Treatment Patient Details Name: George [name] MRN: [id number] DOB: 01-30-1991 Today's Date: 02/02/2017    History of Present Illness Pt is a 26 yo male with R trimalleolar fx resulting from falling off skateboard, s/p 01/31/17 ORIF and is NWB. PMH includes, ADHD, anxiety, bipolar disorder and depression.    PT Comments    Pt is progressing with PT. Pt min guard with sit<>stand transfers to RW and minA for steadying with ambulation 20 feet with RW. Pt educated on need to not let the pain overwhelm him when he switches position and to focus on relaxation. Pt educated on exercises that will aid in increasing UE and LE to help in hopping during ambulation to maintain NWB on R LE. Pt requires continued skilled PT to continue gait training and to improve endurance and strength to safely navigate his environment.    Follow Up Recommendations  Supervision/Assistance - 24 hour;Home health PT     Equipment Recommendations  Rolling walker with 5" wheels;3in1 (PT)    Recommendations for Other Services OT consult     Precautions / Restrictions Precautions Precautions: Other (comment) (trimalleolar fx ) Required Braces or Orthoses:  (Splint) Restrictions Weight Bearing Restrictions: Yes RLE Weight Bearing: Non weight bearing    Mobility  Bed Mobility                  Transfers Overall transfer level: Needs assistance   Transfers: Sit to/from Stand Sit to Stand: Min guard (to RW)         General transfer comment: able to come to standing and stay upright despite increase in pain  Ambulation/Gait Ambulation/Gait assistance: Min assist Ambulation Distance (Feet): 20 Feet (2x10) Assistive device: Rolling walker (2 wheeled) Gait Pattern/deviations: Trunk flexed (hop to pattern to maintain NWB) Gait velocity: slowed Gait velocity interpretation: Below normal speed for age/gender General Gait Details: maintained NWB vc for rolling walker instead of picking  it up and for eccentric lowering of L LE to reduce stress on knee Pt UE tired quickly and he returned to recliner     Balance Overall balance assessment: Needs assistance Sitting-balance support: No upper extremity supported Sitting balance-Leahy Scale: Normal     Standing balance support: Single extremity supported Standing balance-Leahy Scale: Fair Standing balance comment: required at least single UE support to maintain balance and NWB                            Cognition Arousal/Alertness: Awake/alert Behavior During Therapy: WFL for tasks assessed/performed Overall Cognitive Status: Within Functional Limits for tasks assessed                                        Exercises General Exercises - Lower Extremity Ankle Circles/Pumps: AROM;20 reps;Both;Seated Quad Sets: AROM;10 reps;Both;Seated Gluteal Sets: AROM;Both;10 reps;Seated Long Arc Quad: AROM;Right;10 reps;Seated        Pertinent Vitals/Pain Pain Assessment: 0-10 Pain Score: 5  Pain Location: R ankle Pain Descriptors / Indicators: Burning;Tingling;Grimacing;Guarding Pain Intervention(s): Limited activity within patient's tolerance;Monitored during session         VSS         PT Goals (current goals can now be found in the care plan section) Acute Rehab PT Goals Patient Stated Goal: to get out of hospital PT Goal Formulation: With patient Time For Goal Achievement: 02/08/17 Potential to Achieve Goals:  Good Progress towards PT goals: Progressing toward goals    Frequency    Min 6X/week      PT Plan Current plan remains appropriate    Co-evaluation             End of Session Equipment Utilized During Treatment: Gait belt Activity Tolerance: Patient limited by fatigue Patient left: in chair;with call bell/phone within reach Nurse Communication: Mobility status (pt continued complaints of splint compression ) PT Visit Diagnosis: Other abnormalities of gait and  mobility (R26.89);Difficulty in walking, not elsewhere classified (R26.2);Pain Pain - Right/Left: Right Pain - part of body: Ankle and joints of foot     Time: 5621-3086 PT Time Calculation (min) (ACUTE ONLY): 42 min  Charges:  $Gait Training: 8-22 mins $Therapeutic Exercise: 8-22 mins $Therapeutic Activity: 8-22 mins                    G Codes:       Massiel Stipp B. Beverely Risen PT, DPT Acute Rehabilitation  [phone] Pager [phone]     Elon Alas Fleet 02/02/2017, 5:51 PM

## 2017-02-02 NOTE — Anesthesia Postprocedure Evaluation (Addendum)
Anesthesia Post Note  Patient: [name]  Procedure(s) Performed: Procedure(s) (LRB): OPEN REDUCTION INTERNAL FIXATION (ORIF) RIGHT TRIMALLEOLAR ANKLE FRACTURE (Right)  Patient location during evaluation: PACU Anesthesia Type: Regional Level of consciousness: awake and alert Pain management: pain level controlled Vital Signs Assessment: post-procedure vital signs reviewed and stable Respiratory status: spontaneous breathing, nonlabored ventilation, respiratory function stable and patient connected to nasal cannula oxygen Cardiovascular status: blood pressure returned to baseline and stable Postop Assessment: no signs of nausea or vomiting Anesthetic complications: no       Last Vitals:  Vitals:   02/01/17 2118 02/02/17 0626  BP: (!) 117/58 (!) 137/49  Pulse: 79 80  Resp: 16 16  Temp: 37.3 C 36.9 C    Last Pain:  Vitals:   02/02/17 1400  TempSrc:   PainSc: Asleep                 Rudolph Dobler

## 2017-02-02 NOTE — Progress Notes (Signed)
Occupational Therapy Treatment Patient Details Name: George [name] MRN: [id number] DOB: 01-Mar-1991 Today's Date: 02/02/2017    History of present illness Pt is a 26 yo male with R trimalleolar fx resulting from falling off skateboard, s/p 01/31/17 ORIF and is NWB. PMH includes, ADHD, anxiety, bipolar disorder and depression.   OT comments  Pain limiting this pt. Mother states her home is not accessible for pt and he needs placement  Follow Up Recommendations  SNF;Supervision/Assistance - 24 hour    Equipment Recommendations  Other (comment) (TBD at next venue)    Recommendations for Other Services      Precautions / Restrictions Precautions Precautions: Fall Restrictions Weight Bearing Restrictions: Yes RLE Weight Bearing: Non weight bearing       Mobility Bed Mobility Overal bed mobility: Needs Assistance Bed Mobility: Supine to Sit       Sit to supine: Supervision;HOB elevated      Transfers Overall transfer level: Needs assistance Equipment used: Rolling walker (2 wheeled) Transfers: Sit to/from [employer] Sit to Stand: Min assist Stand pivot transfers: Min assist       General transfer comment: Pt sat down quickly after initial stand.  Explained importance of not sitting quickly as it is not safe!  Pt did better 2nd time and getting to chair        ADL either performed or assessed with clinical judgement   ADL Overall ADL's : Needs assistance/impaired Eating/Feeding: Independent;Sitting   Grooming: Set up;Sitting   Upper Body Bathing: Set up;Sitting               Toilet Transfer: Minimal assistance;Stand-pivot;RW Toilet Transfer Details (indicate cue type and reason): bed to recliner Toileting- Clothing Manipulation and Hygiene: Sitting/lateral lean;Sit to/from stand;Moderate assistance Toileting - Clothing Manipulation Details (indicate cue type and reason): limited due to needing to have both hands on walker.               Vision Baseline Vision/History: No visual deficits            Cognition Arousal/Alertness: Awake/alert Behavior During Therapy: Restless Overall Cognitive Status: Within Functional Limits for tasks assessed                                                     Pertinent Vitals/ Pain       Pain Score: 10-Worst pain ever Pain Location: R ankle Pain Descriptors / Indicators: Pressure;Grimacing Pain Intervention(s): Limited activity within patient's tolerance;Monitored during session;Premedicated before session;Repositioned         Frequency  Min 2X/week        Progress Toward Goals  OT Goals(current goals can now be found in the care plan section)  Progress towards OT goals: Progressing toward goals     Plan Discharge plan needs to be updated    Co-evaluation                 End of Session Equipment Utilized During Treatment: Rolling walker  OT Visit Diagnosis: Unsteadiness on feet (R26.81);Pain Pain - Right/Left: Right Pain - part of body: Ankle and joints of foot   Activity Tolerance Patient limited by pain   Patient Left with call bell/phone within reach;with family/visitor present;in chair   Nurse Communication Mobility status (pt still c/o pain--I went in with RN to unwrap and re-wrap ace wraps but  this did not help pt's pain; RN still trying to get in touch with MD)        Time: 1610-9604 OT Time Calculation (min): 20 min  Charges: OT General Charges $OT Visit: 1 Procedure OT Treatments $Self Care/Home Management : 8-22 mins  George [name], George [name] [phone]   George [name] D 02/02/2017, 9:45 AM

## 2017-02-07 ENCOUNTER — Emergency Department (HOSPITAL_COMMUNITY)
Admission: EM | Admit: 2017-02-07 | Discharge: 2017-02-09 | Disposition: A | Discharge status: Home / Self Care | Payer: Self-pay | Attending: Emergency Medicine | Admitting: Emergency Medicine

## 2017-02-07 ENCOUNTER — Emergency Department (HOSPITAL_COMMUNITY): Payer: Self-pay

## 2017-02-07 ENCOUNTER — Encounter (HOSPITAL_COMMUNITY): Payer: Self-pay | Admitting: Family Medicine

## 2017-02-07 DIAGNOSIS — F250 Schizoaffective disorder, bipolar type: Secondary | ICD-10-CM | POA: Diagnosis present

## 2017-02-07 DIAGNOSIS — Z79899 Other long term (current) drug therapy: Secondary | ICD-10-CM | POA: Insufficient documentation

## 2017-02-07 DIAGNOSIS — F17210 Nicotine dependence, cigarettes, uncomplicated: Secondary | ICD-10-CM | POA: Insufficient documentation

## 2017-02-07 DIAGNOSIS — F1220 Cannabis dependence, uncomplicated: Secondary | ICD-10-CM | POA: Diagnosis present

## 2017-02-07 DIAGNOSIS — Z7982 Long term (current) use of aspirin: Secondary | ICD-10-CM | POA: Insufficient documentation

## 2017-02-07 DIAGNOSIS — F909 Attention-deficit hyperactivity disorder, unspecified type: Secondary | ICD-10-CM | POA: Insufficient documentation

## 2017-02-07 NOTE — ED Notes (Signed)
Bed: WU98 Expected date:  Expected time:  Means of arrival:  Comments: [employer]/male/IVC
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
## 2017-02-08 DIAGNOSIS — F1220 Cannabis dependence, uncomplicated: Secondary | ICD-10-CM | POA: Diagnosis present

## 2017-02-08 DIAGNOSIS — F250 Schizoaffective disorder, bipolar type: Secondary | ICD-10-CM | POA: Diagnosis present

## 2017-02-08 LAB — CBC WITH DIFFERENTIAL/PLATELET
BASOS PCT: 0 %
Basophils Absolute: 0 10*3/uL (ref 0.0–0.1)
EOS PCT: 4 %
Eosinophils Absolute: 0.3 10*3/uL (ref 0.0–0.7)
HEMATOCRIT: 38.5 % — AB (ref 39.0–52.0)
Hemoglobin: 12.4 g/dL — ABNORMAL LOW (ref 13.0–17.0)
Lymphocytes Relative: 22 %
Lymphs Abs: 1.8 10*3/uL (ref 0.7–4.0)
MCH: 27 pg (ref 26.0–34.0)
MCHC: 32.2 g/dL (ref 30.0–36.0)
MCV: 83.7 fL (ref 78.0–100.0)
MONO ABS: 0.7 10*3/uL (ref 0.1–1.0)
MONOS PCT: 9 %
NEUTROS ABS: 5.2 10*3/uL (ref 1.7–7.7)
Neutrophils Relative %: 65 %
PLATELETS: 297 10*3/uL (ref 150–400)
RBC: 4.6 MIL/uL (ref 4.22–5.81)
RDW: 14.5 % (ref 11.5–15.5)
WBC: 8 10*3/uL (ref 4.0–10.5)

## 2017-02-08 LAB — RAPID URINE DRUG SCREEN, HOSP PERFORMED
Amphetamines: NOT DETECTED
BARBITURATES: NOT DETECTED
Benzodiazepines: NOT DETECTED
COCAINE: NOT DETECTED
Opiates: NOT DETECTED
Tetrahydrocannabinol: POSITIVE — AB

## 2017-02-08 LAB — COMPREHENSIVE METABOLIC PANEL
ALBUMIN: 4.1 g/dL (ref 3.5–5.0)
ALK PHOS: 70 U/L (ref 38–126)
ALT: 65 U/L — AB (ref 17–63)
AST: 34 U/L (ref 15–41)
Anion gap: 8 (ref 5–15)
BILIRUBIN TOTAL: 0.5 mg/dL (ref 0.3–1.2)
BUN: 20 mg/dL (ref 6–20)
CALCIUM: 9.5 mg/dL (ref 8.9–10.3)
CO2: 25 mmol/L (ref 22–32)
CREATININE: 1.01 mg/dL (ref 0.61–1.24)
Chloride: 103 mmol/L (ref 101–111)
GFR calc Af Amer: >60 mL/min (ref >60)
Glucose, Bld: 96 mg/dL (ref 65–99)
POTASSIUM: 3.8 mmol/L (ref 3.5–5.1)
Sodium: 136 mmol/L (ref 135–145)
Total Protein: 7.7 g/dL (ref 6.5–8.1)

## 2017-02-08 LAB — SALICYLATE LEVEL

## 2017-02-08 LAB — ETHANOL

## 2017-02-08 LAB — ACETAMINOPHEN LEVEL: Acetaminophen (Tylenol), Serum: <10 ug/mL — ABNORMAL LOW (ref 10–30)

## 2017-02-08 MED ORDER — ASENAPINE MALEATE 5 MG SL SUBL
10.0000 mg | SUBLINGUAL_TABLET | Freq: Two times a day (BID) | SUBLINGUAL | Status: DC
  Administered 2017-02-08: 10 mg via SUBLINGUAL
  Filled 2017-02-08: qty 2

## 2017-02-08 MED ORDER — OXCARBAZEPINE 150 MG PO TABS
150.0000 mg | ORAL_TABLET | Freq: Two times a day (BID) | ORAL | Status: DC
  Administered 2017-02-08: 150 mg via ORAL
  Filled 2017-02-08: qty 1

## 2017-02-08 MED ORDER — HYDROXYZINE HCL 25 MG PO TABS: 25.0000 mg | ORAL_TABLET | Freq: Four times a day (QID) | ORAL | Status: DC | PRN

## 2017-02-08 MED ORDER — IBUPROFEN 800 MG PO TABS: 800.0000 mg | ORAL_TABLET | Freq: Four times a day (QID) | ORAL | Status: DC | PRN

## 2017-02-08 MED ORDER — DIPHENHYDRAMINE HCL 50 MG/ML IJ SOLN
50.0000 mg | Freq: Once | INTRAMUSCULAR | Status: AC
  Administered 2017-02-08: 50 mg via INTRAMUSCULAR
  Filled 2017-02-08: qty 1

## 2017-02-08 MED ORDER — QUETIAPINE FUMARATE 300 MG PO TABS: 400.0000 mg | ORAL_TABLET | Freq: Every day | ORAL | Status: DC

## 2017-02-08 MED ORDER — LORAZEPAM 2 MG/ML IJ SOLN
2.0000 mg | Freq: Once | INTRAMUSCULAR | Status: AC
  Administered 2017-02-08: 2 mg via INTRAMUSCULAR
  Filled 2017-02-08: qty 1

## 2017-02-08 MED ORDER — TRAZODONE HCL 50 MG PO TABS: 50.0000 mg | ORAL_TABLET | Freq: Every day | ORAL | Status: DC

## 2017-02-08 MED ORDER — OXYCODONE-ACETAMINOPHEN 5-325 MG PO TABS
1.0000 | ORAL_TABLET | ORAL | Status: DC | PRN
  Administered 2017-02-08: 1 via ORAL
  Filled 2017-02-08: qty 1

## 2017-02-08 MED ORDER — LAMOTRIGINE 25 MG PO TABS
50.0000 mg | ORAL_TABLET | Freq: Every day | ORAL | Status: DC
  Administered 2017-02-08: 50 mg via ORAL
  Filled 2017-02-08: qty 2

## 2017-02-08 MED ORDER — QUETIAPINE FUMARATE 50 MG PO TABS: 250.0000 mg | ORAL_TABLET | Freq: Every day | ORAL | Status: DC

## 2017-02-08 MED ORDER — ZIPRASIDONE MESYLATE 20 MG IM SOLR
20.0000 mg | Freq: Once | INTRAMUSCULAR | Status: AC
  Administered 2017-02-08: 20 mg via INTRAMUSCULAR
  Filled 2017-02-08: qty 20

## 2017-02-08 MED ORDER — GABAPENTIN 400 MG PO CAPS
400.0000 mg | ORAL_CAPSULE | Freq: Three times a day (TID) | ORAL | Status: DC
  Administered 2017-02-08: 400 mg via ORAL
  Filled 2017-02-08: qty 1

## 2017-02-08 MED ORDER — METHOCARBAMOL 500 MG PO TABS: 750.0000 mg | ORAL_TABLET | Freq: Two times a day (BID) | ORAL | Status: DC | PRN

## 2017-02-08 MED ORDER — ONDANSETRON HCL 4 MG PO TABS: 4.0000 mg | ORAL_TABLET | Freq: Three times a day (TID) | ORAL | Status: DC | PRN

## 2017-02-08 MED ORDER — OLANZAPINE 5 MG PO TABS: 15.0000 mg | ORAL_TABLET | Freq: Every day | ORAL | Status: DC

## 2017-02-08 MED ORDER — QUETIAPINE FUMARATE 25 MG PO TABS
25.0000 mg | ORAL_TABLET | Freq: Every day | ORAL | Status: DC
  Administered 2017-02-08: 25 mg via ORAL
  Filled 2017-02-08: qty 1

## 2017-02-08 MED ORDER — STERILE WATER FOR INJECTION IJ SOLN
INTRAMUSCULAR | Status: AC
  Filled 2017-02-08: qty 10

## 2017-02-08 NOTE — ED Notes (Addendum)
Patient woke up briefly and urinated in trash can.  Sitter had placed urinal close to patient's reach but he was unable to use it.

## 2017-02-08 NOTE — ED Notes (Signed)
Agitation in patient is now decreased.  He is lying in bed with the covers over his head.

## 2017-02-08 NOTE — ED Notes (Signed)
After TTS left the room, patient shut the door. Sitter attempted to inform patient the importance of maintaining the door open for his room. Pt refused to comply. Writer went to patients room. Pt reported he would comply with keeping his door open but he started using foul language. He was irritated from his TTS consult. Security called for patients behavior. Pt is talking to Loletta Specter, Engineer, materials and is calming down.

## 2017-02-08 NOTE — ED Notes (Signed)
Patient talking non-stop to whoever sits near his door.  He is calm but changes topics frequently.  Tore off the pant let to his scrubs and was sitting with his privates exposed.  Asked nurse and sitter to put his new paper scrub bottoms on for him.  Patient capable of doing this himself.
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
## 2017-02-08 NOTE — ED Notes (Signed)
Patient was found to have an improvised fork utensil he was using th shave off dead skin of feet. Patient stated he was "giving himself a pedicure" No intent to harm self. Patient willingingly threw utensil away with no problems. Patient calm at this time.

## 2017-02-08 NOTE — ED Notes (Signed)
Patient spit both pills of saphris out after putting them under his tongue.  Nurse practitioner notifed.

## 2017-02-08 NOTE — ED Notes (Addendum)
Pt stated "You will have to sit on the bed so I can tell you the [place] Night short version.  It's 1001 pages."  Pt stated "I'm ready to go home.  They gave me a shot."  Informed pt is IVC & is unable to go home @ this time.

## 2017-02-08 NOTE — ED Notes (Signed)
Pt provided copy of department rules d/t pt throwing items on the floor, urinating in the garbage can, pouring water out and tearing the cotton off his soft cast.

## 2017-02-08 NOTE — ED Notes (Signed)
Patient more relaxed after his bath, but during bath was having some pressured speech and flight of ideas.  Took medication without difficulty.

## 2017-02-08 NOTE — ED Notes (Signed)
Dr. Donnald Garre informed of pt tearing up splint to RLE.
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
## 2017-02-08 NOTE — ED Notes (Addendum)
Patient was sitting up in chair when he started doing his "hot yoga" poses in the chair.  Chair was banging on the floor and sounded like it was going to break.  Asked patient to discontinue because the chair was going to break and it was not good for his broken bones to be moved around so violently.  Patient was like a child and would listen for a minute but then continue the behavior.  Nurse and tech assisted patient back into his bed and told him to rest for a while.  Chair taken out of room.  Patient appears to be slightly more agitated this afternoon than this morning.  Has not rested at all and night nurse reported he did not sleep all night.

## 2017-02-08 NOTE — ED Notes (Signed)
Patient becoming increasingly agitated.  Called nurse practitioner and she ordered saphris and said to discontinue other psychiatric medications.

## 2017-02-08 NOTE — Discharge Summary (Signed)
Physician Discharge Summary      Patient ID: George [name] MRN: [id number] DOB/AGE: 10-Jun-1991 25 y.o.  Admit date: 01/26/2017 Discharge date: 02/08/2017  Admission Diagnoses:  Bipolar 1 disorder, mixed, moderate (HCC)  Discharge Diagnoses:  Active Problems:   Trimalleolar fracture of ankle, closed, right, initial encounter   Past Medical History:  Diagnosis Date  . ADHD (attention deficit hyperactivity disorder)   . Ankle fracture 01/2017   RIGHT ANKLE  . Anxiety   . Bipolar disorder (HCC)   . Depression   . Sleep disorder 04/30/2014    Surgeries: Procedure(s): OPEN REDUCTION INTERNAL FIXATION (ORIF) RIGHT TRIMALLEOLAR ANKLE FRACTURE on 01/26/2017 - 01/31/2017   Consultants (if any):   Discharged Condition: Improved  Hospital Course: George [name] is an 26 y.o. male who was admitted 01/26/2017 with a diagnosis of Bipolar 1 disorder, mixed, moderate (HCC) and went to the operating room on 01/26/2017 - 01/31/2017 and underwent the above named procedures.    He was given perioperative antibiotics:  Anti-infectives    Start     Dose/Rate Route Frequency Ordered Stop   01/31/17 1830  clindamycin (CLEOCIN) IVPB 600 mg     600 mg 100 mL/hr over 30 Minutes Intravenous Every 6 hours 01/31/17 1426 02/01/17 0552   01/31/17 1230  clindamycin (CLEOCIN) IVPB 600 mg     600 mg 100 mL/hr over 30 Minutes Intravenous To Surgery 01/30/17 1547 01/31/17 1227   01/30/17 1545  ceFAZolin (ANCEF) IVPB 2g/100 mL premix  Status:  Discontinued    Comments:  Anesthesia to give preop   2 g 200 mL/hr over 30 Minutes Intravenous  Once 01/30/17 1537 01/30/17 1547   01/26/17 0630  clindamycin (CLEOCIN) IVPB 900 mg  Status:  Discontinued     900 mg 100 mL/hr over 30 Minutes Intravenous On call to O.R. 01/26/17 4098 01/26/17 1932    .  He was given sequential compression devices, early ambulation, and aspirin for DVT prophylaxis.  He benefited maximally from the hospital stay and there were no  complications.    Recent vital signs:  Vitals:   02/01/17 2118 02/02/17 0626  BP: (!) 117/58 (!) 137/49  Pulse: 79 80  Resp: 16 16  Temp: 99.1 F (37.3 C) 98.4 F (36.9 C)    Recent laboratory studies:  Lab Results  Component Value Date   HGB 12.4 (L) 02/08/2017   HGB 12.6 (L) 01/26/2017   HGB 13.2 01/26/2017   Lab Results  Component Value Date   WBC 8.0 02/08/2017   PLT 297 02/08/2017   No results found for: INR Lab Results  Component Value Date   NA 136 02/08/2017   K 3.8 02/08/2017   CL 103 02/08/2017   CO2 25 02/08/2017   BUN 20 02/08/2017   CREATININE 1.01 02/08/2017   GLUCOSE 96 02/08/2017    Discharge Medications:   Allergies as of 02/02/2017      Reactions   Amoxicillin Swelling, Rash   "throat started to close up"   Penicillins Rash, Other (See Comments)   Has patient had a PCN reaction causing immediate rash, facial/tongue/throat swelling, SOB or lightheadedness with hypotension: unsure Has patient had a PCN reaction causing severe rash involving mucus membranes or skin necrosis: Unsure Has patient had a PCN reaction that required hospitalization Unsure Has patient had a PCN reaction occurring within the last 10 years: No If all of the above answers are "NO", then may proceed with Cephalosporin use.      Medication List  TAKE these medications   aspirin EC 325 MG tablet Take 1 tablet (325 mg total) by mouth 2 (two) times daily.   benztropine 0.5 MG tablet Commonly known as:  COGENTIN Take 1 tablet (0.5 mg total) by mouth 2 (two) times daily in the am and at bedtime..   hydrOXYzine 25 MG tablet Commonly known as:  ATARAX/VISTARIL Take 1 tablet (25 mg total) by mouth 3 (three) times daily as needed for anxiety. What changed:  when to take this   ibuprofen 200 MG tablet Commonly known as:  ADVIL,MOTRIN Take 800 mg by mouth every 6 (six) hours as needed for headache or moderate pain.   lamoTRIgine 25 MG tablet Commonly known as:   LAMICTAL Take 2 tablets (50 mg total) by mouth daily.   metFORMIN 1000 MG tablet Commonly known as:  GLUCOPHAGE Take 1 tablet (1,000 mg total) by mouth 2 (two) times daily with a meal.   methocarbamol 750 MG tablet Commonly known as:  ROBAXIN Take 1 tablet (750 mg total) by mouth 2 (two) times daily as needed for muscle spasms.   ondansetron 4 MG tablet Commonly known as:  ZOFRAN Take 1-2 tablets (4-8 mg total) by mouth every 8 (eight) hours as needed for nausea or vomiting.   Oxcarbazepine 300 MG tablet Commonly known as:  TRILEPTAL Take 150 mg by mouth 2 (two) times daily.   promethazine 25 MG tablet Commonly known as:  PHENERGAN Take 1 tablet (25 mg total) by mouth every 6 (six) hours as needed for nausea.   QUEtiapine 25 MG tablet Commonly known as:  SEROQUEL Take 25 mg by mouth daily.   QUEtiapine 200 MG tablet Commonly known as:  SEROQUEL Take 250 mg by mouth at bedtime.   traZODone 50 MG tablet Commonly known as:  DESYREL Take 1 tablet (50 mg total) by mouth at bedtime.   TYLENOL PO Take 2 tablets by mouth every 6 (six) hours as needed (for pain).       Diagnostic Studies: Dg Chest 2 View  Result Date: 02/07/2017 CLINICAL DATA:  Medical clearance EXAM: CHEST  2 VIEW COMPARISON:  03/16/2016 FINDINGS: The lungs are clear. The pulmonary vasculature is normal. Heart size is normal. Hilar and mediastinal contours are unremarkable. There is no pleural effusion. IMPRESSION: Normal chest Electronically Signed   By: Ellery Plunk M.D.   On: 02/07/2017 23:57   Dg Ankle Complete Right  Result Date: 01/31/2017 CLINICAL DATA:  Open reduction internal fixation of right ankle fracture. EXAM: DG C-ARM 61-120 MIN; RIGHT ANKLE - COMPLETE 3+ VIEW FLUOROSCOPY TIME:  29 seconds. COMPARISON:  Radiographs of January 18, 2017. FINDINGS: Four intraoperative fluoroscopic images were obtained of the right ankle. These demonstrate the patient to be status post surgical internal fixation of  distal right fibular fracture. Good alignment of fracture components is noted. IMPRESSION: Status post surgical internal fixation of distal right fibular fracture. Electronically Signed   By: Lupita Raider, M.D.   On: 01/31/2017 13:24   Dg Ankle Complete Right  Result Date: 01/18/2017 CLINICAL DATA:  Skateboarding injury this afternoon. Fell and twisted the ankle. Pain and swelling to the lateral side of the right ankle radiating to the lateral and top of foot. EXAM: RIGHT ANKLE - COMPLETE 3+ VIEW; RIGHT FOOT COMPLETE - 3+ VIEW COMPARISON:  None. FINDINGS: Soft tissue swelling about the right ankle. There is an oblique fracture of the distal left fibula with extension to the tibia fibular and talofibular joints. Avulsion fracture off of  the medial malleolus of the right ankle. Mild widening of the medial tibiotalar space. Posterior malleolus appears intact. Slight depression irregularity of the medial aspect of the talus may indicate a compression fracture. Right foot appears intact. No additional fracture or dislocation identified. Soft tissue swelling over the dorsum of the hindfoot. Old appearing ununited ossicle adjacent to the cuboidal bone. IMPRESSION: Fractures of the medial and lateral malleolus of the right ankle with widening of the medial ankle joint space. Irregularity of the medial aspect of the talar dome suggesting a possible compression fracture. Soft tissue swelling. Electronically Signed   By: Burman Nieves M.D.   On: 01/18/2017 22:54   Ct Foot Right Wo Contrast  Result Date: 01/19/2017 CLINICAL DATA:  26 year old male with right ankle fracture. EXAM: CT OF THE RIGHT FOOT WITHOUT CONTRAST TECHNIQUE: Multidetector CT imaging of the right foot was performed according to the standard protocol. Multiplanar CT image reconstructions were also generated. COMPARISON:  Right foot radiograph dated 01/18/2017 FINDINGS: Bones/Joint/Cartilage There is a minimally displaced oblique fracture of the  distal fibula extending into the ankle joint and talofibular articulation. There is a faint curvilinear bony density adjacent to the tip of the medial malleolus which likely represents a small cortical avulsion fracture. There is minimal widening of the medial tibiotalar joint space. Small minimally displaced posterior malleolar fracture fragments noted. Small bone fragments along the posterolateral corner of the cuboid likely chronic. An avulsion injury is less likely but not entirely excluded. No other acute fracture identified. Ligaments Suboptimally assessed by CT. Muscles and Tendons No intramuscular hematoma. Soft tissues There is diffuse superficial soft tissue edema. No fluid collection. IMPRESSION: Minimally displaced oblique fracture of the distal fibula as well as small fracture fragments of the posterior malleolus and tiny cortical avulsion injury of the medial malleolus. There is minimal widening of the medial tibiotalar articulation. Small bony fragments along the posterolateral corner of the inferior surface of the cuboid may be chronic. Small avulsion fracture is not excluded. No metatarsal fractures. Electronically Signed   By: Elgie Collard M.D.   On: 01/19/2017 00:41   Dg Foot Complete Right  Result Date: 01/18/2017 CLINICAL DATA:  Skateboarding injury this afternoon. Fell and twisted the ankle. Pain and swelling to the lateral side of the right ankle radiating to the lateral and top of foot. EXAM: RIGHT ANKLE - COMPLETE 3+ VIEW; RIGHT FOOT COMPLETE - 3+ VIEW COMPARISON:  None. FINDINGS: Soft tissue swelling about the right ankle. There is an oblique fracture of the distal left fibula with extension to the tibia fibular and talofibular joints. Avulsion fracture off of the medial malleolus of the right ankle. Mild widening of the medial tibiotalar space. Posterior malleolus appears intact. Slight depression irregularity of the medial aspect of the talus may indicate a compression fracture.  Right foot appears intact. No additional fracture or dislocation identified. Soft tissue swelling over the dorsum of the hindfoot. Old appearing ununited ossicle adjacent to the cuboidal bone. IMPRESSION: Fractures of the medial and lateral malleolus of the right ankle with widening of the medial ankle joint space. Irregularity of the medial aspect of the talar dome suggesting a possible compression fracture. Soft tissue swelling. Electronically Signed   By: Burman Nieves M.D.   On: 01/18/2017 22:54   Dg C-arm 1-60 Min  Result Date: 01/31/2017 CLINICAL DATA:  Open reduction internal fixation of right ankle fracture. EXAM: DG C-ARM 61-120 MIN; RIGHT ANKLE - COMPLETE 3+ VIEW FLUOROSCOPY TIME:  29 seconds. COMPARISON:  Radiographs  of January 18, 2017. FINDINGS: Four intraoperative fluoroscopic images were obtained of the right ankle. These demonstrate the patient to be status post surgical internal fixation of distal right fibular fracture. Good alignment of fracture components is noted. IMPRESSION: Status post surgical internal fixation of distal right fibular fracture. Electronically Signed   By: Lupita Raider, M.D.   On: 01/31/2017 13:24    Disposition: 01-Home or Self Care  Discharge Instructions    Call MD / Call 911    Complete by:  As directed    If you experience chest pain or shortness of breath, CALL 911 and be transported to the hospital emergency room.  If you develope a fever above 101.5 F, pus (white drainage) or increased drainage or redness at the wound, or calf pain, call your surgeon's office.   Constipation Prevention    Complete by:  As directed    Drink plenty of fluids.  Prune juice may be helpful.  You may use a stool softener, such as Colace (over the counter) 100 mg twice a day.  Use MiraLax (over the counter) for constipation as needed.   Driving restrictions    Complete by:  As directed    No driving while taking narcotic pain meds.   Increase activity slowly as  tolerated    Complete by:  As directed       Follow-up Information    Glee Arvin, MD In 2 weeks.   Specialty:  Orthopedic Surgery Why:  For suture removal, For wound re-check Contact information: [address] [place] [address] [phone]            Signed: Glee Arvin 02/08/2017, 2:57 PM

## 2017-02-08 NOTE — ED Notes (Signed)
TTS is at bedside 

## 2017-02-08 NOTE — BH Assessment (Addendum)
[facility] Assessment Progress Note  Per Nanine Means, DNP, this pt requires psychiatric hospitalization at this time.  The following facilities have been contacted to seek placement for this pt, with results as noted:  Beds available, information sent, decision pending:  [facility] [place]   At capacity:  [facility] Doran Heater, [place] Triage Specialist [phone]

## 2017-02-08 NOTE — ED Notes (Signed)
Patient is in the room yelling. Stating "it is against his rights not to be able to have crutches."  Also hysterically laughing at something on tv and then yelling at tv. Beating on bedside table with water bottle yelling "I want breakfast pancakes and eggs". He has removed his top and when asked to put it back on he said "you can't make me, it is my right to sit here with no shirt on if I want to." He is observed pouring ice water on his feet.

## 2017-02-08 NOTE — ED Notes (Signed)
Pt has torn the cotton from the soft cast on RLE.

## 2017-02-09 DIAGNOSIS — F17210 Nicotine dependence, cigarettes, uncomplicated: Secondary | ICD-10-CM

## 2017-02-09 DIAGNOSIS — F1220 Cannabis dependence, uncomplicated: Secondary | ICD-10-CM

## 2017-02-09 DIAGNOSIS — Z810 Family history of intellectual disabilities: Secondary | ICD-10-CM

## 2017-02-09 DIAGNOSIS — F250 Schizoaffective disorder, bipolar type: Secondary | ICD-10-CM

## 2017-02-09 MED ORDER — STERILE WATER FOR INJECTION IJ SOLN
INTRAMUSCULAR | Status: AC
  Administered 2017-02-09: 1.2 mL
  Filled 2017-02-09: qty 10

## 2017-02-09 MED ORDER — ZIPRASIDONE MESYLATE 20 MG IM SOLR
20.0000 mg | Freq: Once | INTRAMUSCULAR | Status: AC
  Administered 2017-02-09: 20 mg via INTRAMUSCULAR
  Filled 2017-02-09: qty 20

## 2017-02-09 NOTE — ED Notes (Signed)
Pt slamming BS table into wall.  BS table removed from room.  Pt has thrown urinal in floor, covers in floor.  Pt cursing staff.
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
## 2017-02-09 NOTE — ED Notes (Signed)
Pt now stating "you probably invented the MyChart program?  Did you ever drive for [employer]?  I know exactly who you are.  I sit around and talk shit to my mother until she gets mad, then I talk to my dad.  But I just rest there.  I stay around [place], [place] & the [place] area.  I hear all this shit but people don't know who they're talking to."

## 2017-02-09 NOTE — ED Notes (Signed)
Patient is loudly singing and has unlocked the bed and is swinging back and forth in the bed. Asked him several times to not continue to unlock the bed and ride it back and forth in room for his safety. Patient laughs and starts singing stating he is going to [employer]. Bed has been locked. Will continue to closely monitor.

## 2017-02-09 NOTE — ED Notes (Signed)
Reviewed d/c information with patient and instructions for follow up at walk in clinic. He verbalized understanding of location, times, and initial copay.

## 2017-02-09 NOTE — Discharge Instructions (Signed)
For your ongoing behavioral health needs you are advised to follow up with [facility].  New patients are seen at their walk-in clinic.  Walk-in hours are Monday - Friday from 8:00 am - 12:00 pm, and from 1:00 pm - 3:00 pm.  Walk-in patients are seen on a first come, first served basis, so try to arrive as early as possible for the best chance of being seen the same day.  There is an initial fee of $22.50: ° °     [facility] °     [address] °     [place], [place] [address] °     [phone] °

## 2017-02-09 NOTE — BHH Counselor (Signed)
Clinician received a call from Vasily at [facility]. Clinician noted the pt is currently under review.    Gwinda Passe, MS, [facility] Triage Specialist [phone]

## 2017-02-09 NOTE — ED Notes (Signed)
Pt hitting towel holder & slamming door.

## 2017-02-09 NOTE — ED Notes (Signed)
Pt now stating "I thought you were my friend, will you forgive me?  Don't I know you from somewhere?  Did you ever work @ [employer]?"
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
## 2017-02-09 NOTE — BH Assessment (Signed)
BHH Assessment Progress Note  Per Thedore Mins, MD, this pt does not require psychiatric hospitalization at this time.  Pt presents under IVC initiated by his father, which Dr Jannifer Franklin has rescinded.  Pt is to be discharged from [facility] with referral information for [facility] [place].  This has been included in pt's discharge instructions.  Pt's nurse has been notified.  Doylene Canning, MA Triage Specialist [phone]

## 2017-02-09 NOTE — ED Notes (Signed)
Pt stating "unless people can prove to me they are smarter than me, they are stupid."
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
## 2017-02-09 NOTE — ED Notes (Signed)
Pt naked in room washing @ sink, talking to self.  Now requesting a remote for the TV.  Pt informed when he is dressed will provide the remote.

## 2017-02-11 ENCOUNTER — Emergency Department (HOSPITAL_COMMUNITY)
Admission: EM | Admit: 2017-02-11 | Discharge: 2017-02-11 | Disposition: A | Discharge status: Home / Self Care | Payer: Self-pay | Attending: Emergency Medicine | Admitting: Emergency Medicine

## 2017-02-11 ENCOUNTER — Encounter (HOSPITAL_COMMUNITY): Payer: Self-pay | Admitting: *Deleted

## 2017-02-11 ENCOUNTER — Emergency Department (HOSPITAL_COMMUNITY): Payer: Self-pay

## 2017-02-11 DIAGNOSIS — F909 Attention-deficit hyperactivity disorder, unspecified type: Secondary | ICD-10-CM | POA: Insufficient documentation

## 2017-02-11 DIAGNOSIS — Z7982 Long term (current) use of aspirin: Secondary | ICD-10-CM | POA: Insufficient documentation

## 2017-02-11 DIAGNOSIS — F17210 Nicotine dependence, cigarettes, uncomplicated: Secondary | ICD-10-CM | POA: Insufficient documentation

## 2017-02-11 DIAGNOSIS — M25571 Pain in right ankle and joints of right foot: Secondary | ICD-10-CM | POA: Insufficient documentation

## 2017-02-11 MED ORDER — MELOXICAM 15 MG PO TABS: 15.0000 mg | ORAL_TABLET | Freq: Every day | ORAL | 0 refills | Status: DC

## 2017-02-11 MED ORDER — LORAZEPAM 1 MG PO TABS
1.0000 mg | ORAL_TABLET | Freq: Once | ORAL | Status: AC
  Administered 2017-02-11: 1 mg via ORAL
  Filled 2017-02-11: qty 1

## 2017-02-11 NOTE — ED Notes (Signed)
Bed: ZO10 Expected date:  Expected time:  Means of arrival:  Comments: 24m ankle pain
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
## 2017-02-11 NOTE — ED Notes (Signed)
Pt arrives via EMS from [place] with c/o ankle pain. Pt has had recent right ankle surgery. Pt says that his psych meds interacts with his pain meds, also he has been walking on his ankle and is having pain. Pt is tearful, anxious. Unsure of when he last took his pain meds/muscle relaxer, states has been taking his psych meds. The pt denies SI or HI during assessment.

## 2017-02-11 NOTE — ED Notes (Signed)
Patient transported to X-ray 

## 2017-02-11 NOTE — ED Notes (Signed)
ED Provider at bedside. 

## 2017-02-11 NOTE — Discharge Instructions (Signed)
Please follow up with Dr. Roda Shutters next week Take Meloxicam once a day. Do not take with Ibuprofen or Naproxen. Do not remove splint
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
## 2017-02-12 ENCOUNTER — Ambulatory Visit (INDEPENDENT_AMBULATORY_CARE_PROVIDER_SITE_OTHER): Payer: Self-pay | Admitting: Orthopaedic Surgery

## 2017-02-12 ENCOUNTER — Inpatient Hospital Stay (INDEPENDENT_AMBULATORY_CARE_PROVIDER_SITE_OTHER): Payer: Self-pay | Admitting: Orthopaedic Surgery

## 2017-02-12 ENCOUNTER — Encounter (HOSPITAL_COMMUNITY): Payer: Self-pay | Admitting: Emergency Medicine

## 2017-02-12 ENCOUNTER — Encounter (INDEPENDENT_AMBULATORY_CARE_PROVIDER_SITE_OTHER): Payer: Self-pay | Admitting: Orthopaedic Surgery

## 2017-02-12 ENCOUNTER — Ambulatory Visit (INDEPENDENT_AMBULATORY_CARE_PROVIDER_SITE_OTHER): Payer: Self-pay

## 2017-02-12 ENCOUNTER — Emergency Department (HOSPITAL_COMMUNITY)
Admission: EM | Admit: 2017-02-12 | Discharge: 2017-02-13 | Disposition: A | Discharge status: Home / Self Care | Payer: Self-pay

## 2017-02-12 DIAGNOSIS — Z7982 Long term (current) use of aspirin: Secondary | ICD-10-CM | POA: Insufficient documentation

## 2017-02-12 DIAGNOSIS — Z7984 Long term (current) use of oral hypoglycemic drugs: Secondary | ICD-10-CM | POA: Insufficient documentation

## 2017-02-12 DIAGNOSIS — M25571 Pain in right ankle and joints of right foot: Secondary | ICD-10-CM | POA: Insufficient documentation

## 2017-02-12 DIAGNOSIS — F918 Other conduct disorders: Secondary | ICD-10-CM | POA: Insufficient documentation

## 2017-02-12 DIAGNOSIS — F250 Schizoaffective disorder, bipolar type: Secondary | ICD-10-CM

## 2017-02-12 DIAGNOSIS — R4689 Other symptoms and signs involving appearance and behavior: Secondary | ICD-10-CM

## 2017-02-12 DIAGNOSIS — S82851A Displaced trimalleolar fracture of right lower leg, initial encounter for closed fracture: Secondary | ICD-10-CM

## 2017-02-12 DIAGNOSIS — R451 Restlessness and agitation: Secondary | ICD-10-CM

## 2017-02-12 DIAGNOSIS — F17210 Nicotine dependence, cigarettes, uncomplicated: Secondary | ICD-10-CM | POA: Insufficient documentation

## 2017-02-12 DIAGNOSIS — Z79899 Other long term (current) drug therapy: Secondary | ICD-10-CM | POA: Insufficient documentation

## 2017-02-12 DIAGNOSIS — F909 Attention-deficit hyperactivity disorder, unspecified type: Secondary | ICD-10-CM | POA: Insufficient documentation

## 2017-02-12 LAB — COMPREHENSIVE METABOLIC PANEL
ALT: 52 U/L (ref 17–63)
AST: 42 U/L — AB (ref 15–41)
Albumin: 4.4 g/dL (ref 3.5–5.0)
Alkaline Phosphatase: 73 U/L (ref 38–126)
Anion gap: 10 (ref 5–15)
BILIRUBIN TOTAL: 0.6 mg/dL (ref 0.3–1.2)
BUN: 21 mg/dL — AB (ref 6–20)
CALCIUM: 9.2 mg/dL (ref 8.9–10.3)
CHLORIDE: 106 mmol/L (ref 101–111)
CO2: 23 mmol/L (ref 22–32)
CREATININE: 0.95 mg/dL (ref 0.61–1.24)
GFR calc Af Amer: >60 mL/min (ref >60)
Glucose, Bld: 99 mg/dL (ref 65–99)
Potassium: 3.4 mmol/L — ABNORMAL LOW (ref 3.5–5.1)
Sodium: 139 mmol/L (ref 135–145)
TOTAL PROTEIN: 8.1 g/dL (ref 6.5–8.1)

## 2017-02-12 LAB — ACETAMINOPHEN LEVEL: Acetaminophen (Tylenol), Serum: <10 ug/mL — ABNORMAL LOW (ref 10–30)

## 2017-02-12 LAB — CBC WITH DIFFERENTIAL/PLATELET
BASOS ABS: 0 10*3/uL (ref 0.0–0.1)
Basophils Relative: 0 %
EOS ABS: 0.3 10*3/uL (ref 0.0–0.7)
Eosinophils Relative: 4 %
HCT: 38.8 % — ABNORMAL LOW (ref 39.0–52.0)
Hemoglobin: 12.4 g/dL — ABNORMAL LOW (ref 13.0–17.0)
LYMPHS PCT: 31 %
Lymphs Abs: 2.5 10*3/uL (ref 0.7–4.0)
MCH: 26.4 pg (ref 26.0–34.0)
MCHC: 32 g/dL (ref 30.0–36.0)
MCV: 82.6 fL (ref 78.0–100.0)
MONO ABS: 0.7 10*3/uL (ref 0.1–1.0)
Monocytes Relative: 8 %
Neutro Abs: 4.5 10*3/uL (ref 1.7–7.7)
Neutrophils Relative %: 57 %
PLATELETS: 319 10*3/uL (ref 150–400)
RBC: 4.7 MIL/uL (ref 4.22–5.81)
RDW: 14.1 % (ref 11.5–15.5)
WBC: 8 10*3/uL (ref 4.0–10.5)

## 2017-02-12 LAB — ETHANOL

## 2017-02-12 LAB — SALICYLATE LEVEL: Salicylate Lvl: <7 mg/dL (ref 2.8–30.0)

## 2017-02-12 MED ORDER — OXCARBAZEPINE 150 MG PO TABS
150.0000 mg | ORAL_TABLET | Freq: Two times a day (BID) | ORAL | Status: DC
  Administered 2017-02-12 – 2017-02-13 (×2): 150 mg via ORAL
  Filled 2017-02-12 (×2): qty 1

## 2017-02-12 MED ORDER — QUETIAPINE FUMARATE 100 MG PO TABS
400.0000 mg | ORAL_TABLET | Freq: Every day | ORAL | Status: DC
  Administered 2017-02-12: 400 mg via ORAL
  Filled 2017-02-12: qty 1

## 2017-02-12 MED ORDER — QUETIAPINE FUMARATE 50 MG PO TABS: 250.0000 mg | ORAL_TABLET | Freq: Every day | ORAL | Status: DC

## 2017-02-12 MED ORDER — QUETIAPINE FUMARATE 50 MG PO TABS
50.0000 mg | ORAL_TABLET | Freq: Two times a day (BID) | ORAL | Status: DC
  Administered 2017-02-13: 50 mg via ORAL
  Filled 2017-02-12: qty 1

## 2017-02-12 MED ORDER — QUETIAPINE FUMARATE 25 MG PO TABS: 25.0000 mg | ORAL_TABLET | Freq: Every day | ORAL | Status: DC

## 2017-02-12 NOTE — ED Notes (Signed)
EDP in room with patient. Will draw in a few minutes

## 2017-02-12 NOTE — ED Notes (Signed)
Bed: WA27 Expected date:  Expected time:  Means of arrival:  Comments: [employer] psych pt 
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
## 2017-02-12 NOTE — ED Notes (Signed)
Patient asking not to be stuck again. Patient stated hes weak, tired and in pain.
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
## 2017-02-13 MED ORDER — QUETIAPINE FUMARATE 200 MG PO TABS: 200.0000 mg | ORAL_TABLET | Freq: Every day | ORAL | 0 refills | Status: DC

## 2017-02-13 MED ORDER — HYDROXYZINE HCL 25 MG PO TABS: 25.0000 mg | ORAL_TABLET | Freq: Two times a day (BID) | ORAL | 0 refills | Status: DC | PRN

## 2017-02-13 MED ORDER — OXCARBAZEPINE 300 MG PO TABS: 150.0000 mg | ORAL_TABLET | Freq: Two times a day (BID) | ORAL | 0 refills | Status: DC

## 2017-02-13 MED ORDER — QUETIAPINE FUMARATE 25 MG PO TABS: 25.0000 mg | ORAL_TABLET | Freq: Two times a day (BID) | ORAL | 0 refills | Status: DC
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
## 2017-02-13 NOTE — [facility] Assessment (Signed)
[facility] Assessment Progress Note  Per Thedore Mins, MD, this pt does not require psychiatric hospitalization at this time.  Pt is to be discharged from [facility] with referral information for [facility].  He is also to be offered information about supportive services for the homeless in this area.  This has been included in pt's discharge instructions.  Pt's nurse has been notified.  Doylene Canning, MA Triage Specialist [phone]
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
## 2017-02-13 NOTE — ED Notes (Signed)
PT DISCHARGED. INSTRUCTIONS AND PRESCRIPTIONS GIVEN. AAOX4. PT IN NO APPARENT DISTRESS OR PAIN. THE OPPORTUNITY TO ASK QUESTIONS WAS PROVIDED. 
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
## 2017-02-13 NOTE — Discharge Instructions (Signed)
For your ongoing mental health needs, you are advised to follow up with [facility].  New and returning patients are seen at their walk-in clinic.  Walk-in hours are Monday - Friday from 8:00 am - 3:00 pm.  Walk-in patients are seen on a first come, first served basis.  Try to arrive as early as possible for he best chance of being seen the same day: ° °     [facility] °     [address] °     [place], [place] [address] °     [phone] ° °For your shelter needs, contact the following service providers: ° °     [facility] (operated by [facility]) °     [address] °     [place], [place] [address] °     [phone] ° °     [facility] °     [address] °     [place], [place] [address] °     [phone] ° °For day shelter and other supportive services for the homeless, contact the [facility]): ° °     [facility] °     [address] °     [place], [place] [address] °     [phone] ° °For transitional housing, contact one of the following agencies.  They provide longer term housing than a shelter, but there is an application process: ° °     [facility] °     [address] °     [place], [place] [address] °     [phone] ° °     [facility] °     [facility] °     [address] °     [place], [place] [address] °     [phone] °

## 2017-03-12 ENCOUNTER — Ambulatory Visit (INDEPENDENT_AMBULATORY_CARE_PROVIDER_SITE_OTHER): Payer: Self-pay | Admitting: Orthopaedic Surgery

## 2017-04-07 ENCOUNTER — Emergency Department (HOSPITAL_COMMUNITY)
Admission: EM | Admit: 2017-04-07 | Discharge: 2017-04-08 | Disposition: A | Discharge status: Home / Self Care | Payer: Federal, State, Local not specified - Other

## 2017-04-07 ENCOUNTER — Encounter (HOSPITAL_COMMUNITY): Payer: Self-pay

## 2017-04-07 DIAGNOSIS — R4689 Other symptoms and signs involving appearance and behavior: Secondary | ICD-10-CM

## 2017-04-07 DIAGNOSIS — Z9189 Other specified personal risk factors, not elsewhere classified: Secondary | ICD-10-CM

## 2017-04-07 DIAGNOSIS — F250 Schizoaffective disorder, bipolar type: Secondary | ICD-10-CM | POA: Diagnosis present

## 2017-04-07 DIAGNOSIS — Z9114 Patient's other noncompliance with medication regimen: Secondary | ICD-10-CM | POA: Insufficient documentation

## 2017-04-07 DIAGNOSIS — Z7982 Long term (current) use of aspirin: Secondary | ICD-10-CM | POA: Insufficient documentation

## 2017-04-07 DIAGNOSIS — F1220 Cannabis dependence, uncomplicated: Secondary | ICD-10-CM | POA: Diagnosis present

## 2017-04-07 DIAGNOSIS — F918 Other conduct disorders: Secondary | ICD-10-CM | POA: Insufficient documentation

## 2017-04-07 DIAGNOSIS — F17210 Nicotine dependence, cigarettes, uncomplicated: Secondary | ICD-10-CM | POA: Insufficient documentation

## 2017-04-07 DIAGNOSIS — Z79899 Other long term (current) drug therapy: Secondary | ICD-10-CM | POA: Insufficient documentation

## 2017-04-07 LAB — COMPREHENSIVE METABOLIC PANEL
ALBUMIN: 4.5 g/dL (ref 3.5–5.0)
ALK PHOS: 102 U/L (ref 38–126)
ALT: 49 U/L (ref 17–63)
ANION GAP: 11 (ref 5–15)
AST: 32 U/L (ref 15–41)
BILIRUBIN TOTAL: 1 mg/dL (ref 0.3–1.2)
BUN: 18 mg/dL (ref 6–20)
CALCIUM: 9.6 mg/dL (ref 8.9–10.3)
CO2: 22 mmol/L (ref 22–32)
Chloride: 105 mmol/L (ref 101–111)
Creatinine, Ser: 1.05 mg/dL (ref 0.61–1.24)
GFR calc Af Amer: >60 mL/min (ref >60)
GLUCOSE: 90 mg/dL (ref 65–99)
Potassium: 4.3 mmol/L (ref 3.5–5.1)
Sodium: 138 mmol/L (ref 135–145)
TOTAL PROTEIN: 7.9 g/dL (ref 6.5–8.1)

## 2017-04-07 LAB — RAPID URINE DRUG SCREEN, HOSP PERFORMED
AMPHETAMINES: NOT DETECTED
BENZODIAZEPINES: NOT DETECTED
Barbiturates: NOT DETECTED
COCAINE: NOT DETECTED
OPIATES: NOT DETECTED
TETRAHYDROCANNABINOL: POSITIVE — AB

## 2017-04-07 LAB — CBC
HCT: 43.2 % (ref 39.0–52.0)
Hemoglobin: 13.9 g/dL (ref 13.0–17.0)
MCH: 27.4 pg (ref 26.0–34.0)
MCHC: 32.2 g/dL (ref 30.0–36.0)
MCV: 85.2 fL (ref 78.0–100.0)
PLATELETS: 241 10*3/uL (ref 150–400)
RBC: 5.07 MIL/uL (ref 4.22–5.81)
RDW: 15.4 % (ref 11.5–15.5)
WBC: 10.2 10*3/uL (ref 4.0–10.5)

## 2017-04-07 LAB — ETHANOL

## 2017-04-07 LAB — ACETAMINOPHEN LEVEL: Acetaminophen (Tylenol), Serum: <10 ug/mL — ABNORMAL LOW (ref 10–30)

## 2017-04-07 LAB — SALICYLATE LEVEL: Salicylate Lvl: <7 mg/dL (ref 2.8–30.0)

## 2017-04-07 MED ORDER — BENZTROPINE MESYLATE 0.5 MG PO TABS
0.5000 mg | ORAL_TABLET | ORAL | Status: DC
  Administered 2017-04-07: 0.5 mg via ORAL
  Filled 2017-04-07: qty 1

## 2017-04-07 MED ORDER — ACETAMINOPHEN 325 MG PO TABS: 650.0000 mg | ORAL_TABLET | ORAL | Status: DC | PRN

## 2017-04-07 MED ORDER — QUETIAPINE FUMARATE ER 200 MG PO TB24
200.0000 mg | ORAL_TABLET | Freq: Every day | ORAL | Status: DC
  Administered 2017-04-07: 200 mg via ORAL
  Filled 2017-04-07: qty 1

## 2017-04-07 MED ORDER — LAMOTRIGINE 25 MG PO TABS
50.0000 mg | ORAL_TABLET | Freq: Every day | ORAL | Status: DC
  Administered 2017-04-07: 50 mg via ORAL
  Filled 2017-04-07: qty 2

## 2017-04-07 MED ORDER — ALUM & MAG HYDROXIDE-SIMETH 200-200-20 MG/5ML PO SUSP: 30.0000 mL | Freq: Four times a day (QID) | ORAL | Status: DC | PRN

## 2017-04-07 MED ORDER — HYDROXYZINE HCL 25 MG PO TABS
25.0000 mg | ORAL_TABLET | Freq: Three times a day (TID) | ORAL | Status: DC | PRN
Start: 2017-04-07 — End: 2017-04-08
  Administered 2017-04-07: 25 mg via ORAL
  Filled 2017-04-07: qty 1

## 2017-04-07 MED ORDER — IBUPROFEN 200 MG PO TABS: 600.0000 mg | ORAL_TABLET | Freq: Three times a day (TID) | ORAL | Status: DC | PRN

## 2017-04-07 MED ORDER — ONDANSETRON HCL 4 MG PO TABS
4.0000 mg | ORAL_TABLET | Freq: Three times a day (TID) | ORAL | Status: DC | PRN
  Filled 2017-04-07: qty 1

## 2017-04-07 MED ORDER — ASPIRIN EC 325 MG PO TBEC
325.0000 mg | DELAYED_RELEASE_TABLET | Freq: Two times a day (BID) | ORAL | Status: DC
  Administered 2017-04-07: 325 mg via ORAL
  Filled 2017-04-07 (×2): qty 1

## 2017-04-07 MED ORDER — METFORMIN HCL 500 MG PO TABS
1000.0000 mg | ORAL_TABLET | Freq: Two times a day (BID) | ORAL | Status: DC
  Filled 2017-04-07: qty 2

## 2017-04-07 MED ORDER — OXCARBAZEPINE 150 MG PO TABS
150.0000 mg | ORAL_TABLET | Freq: Two times a day (BID) | ORAL | Status: DC
  Administered 2017-04-07: 150 mg via ORAL
  Filled 2017-04-07: qty 1

## 2017-04-07 NOTE — ED Notes (Signed)
Hourly rounding reveals patient sleeping in room. No complaints, stable, in no acute distress. Q15 minute rounds and monitoring via Security Cameras to continue. 

## 2017-04-07 NOTE — ED Notes (Signed)
Pt. Transferred to [facility] from ED to room after screening for contraband. Report to include Situation, Background, Assessment and Recommendations from Elaine RN. Pt. Oriented to unit including Q15 minute rounds as well as the security cameras for their protection. Patient is alert and oriented, warm and dry in no acute distress. Patient denies SI, HI, and AVH. Pt. Encouraged to let me know if needs arise. 
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
## 2017-04-07 NOTE — BH Assessment (Signed)
[facility] Assessment Progress Note   Case was staffed with Lord DNP who recommended patient be re-evaluated in the a.m.    
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
## 2017-04-08 DIAGNOSIS — F39 Unspecified mood [affective] disorder: Secondary | ICD-10-CM

## 2017-04-08 DIAGNOSIS — F17210 Nicotine dependence, cigarettes, uncomplicated: Secondary | ICD-10-CM

## 2017-04-08 DIAGNOSIS — F1220 Cannabis dependence, uncomplicated: Secondary | ICD-10-CM

## 2017-04-08 DIAGNOSIS — F250 Schizoaffective disorder, bipolar type: Secondary | ICD-10-CM | POA: Diagnosis not present

## 2017-04-08 DIAGNOSIS — Z810 Family history of intellectual disabilities: Secondary | ICD-10-CM

## 2017-04-08 DIAGNOSIS — F419 Anxiety disorder, unspecified: Secondary | ICD-10-CM

## 2017-04-08 NOTE — ED Notes (Signed)
Hourly rounding reveals patient sleeping in room. No complaints, stable, in no acute distress. Q15 minute rounds and monitoring via Security Cameras to continue. 
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
## 2017-04-09 NOTE — Addendum Note (Signed)
Addendum  created 04/09/17 1253 by Traver Meckes, MD   Sign clinical note    

## 2017-04-10 ENCOUNTER — Emergency Department (HOSPITAL_COMMUNITY)
Admission: EM | Admit: 2017-04-10 | Discharge: 2017-04-10 | Disposition: A | Discharge status: Home / Self Care | Payer: Self-pay | Attending: Emergency Medicine | Admitting: Emergency Medicine

## 2017-04-10 ENCOUNTER — Encounter (HOSPITAL_COMMUNITY): Payer: Self-pay | Admitting: Emergency Medicine

## 2017-04-10 DIAGNOSIS — F919 Conduct disorder, unspecified: Secondary | ICD-10-CM | POA: Insufficient documentation

## 2017-04-10 DIAGNOSIS — Z5321 Procedure and treatment not carried out due to patient leaving prior to being seen by health care provider: Secondary | ICD-10-CM | POA: Insufficient documentation

## 2017-04-10 LAB — CBC
HEMATOCRIT: 41.1 % (ref 39.0–52.0)
HEMOGLOBIN: 13.6 g/dL (ref 13.0–17.0)
MCH: 27.5 pg (ref 26.0–34.0)
MCHC: 33.1 g/dL (ref 30.0–36.0)
MCV: 83.2 fL (ref 78.0–100.0)
Platelets: 252 10*3/uL (ref 150–400)
RBC: 4.94 MIL/uL (ref 4.22–5.81)
RDW: 14.9 % (ref 11.5–15.5)
WBC: 8.3 10*3/uL (ref 4.0–10.5)

## 2017-04-10 LAB — COMPREHENSIVE METABOLIC PANEL
ALBUMIN: 4.5 g/dL (ref 3.5–5.0)
ALK PHOS: 98 U/L (ref 38–126)
ALT: 51 U/L (ref 17–63)
AST: 36 U/L (ref 15–41)
Anion gap: 7 (ref 5–15)
BUN: 11 mg/dL (ref 6–20)
CALCIUM: 9.4 mg/dL (ref 8.9–10.3)
CHLORIDE: 104 mmol/L (ref 101–111)
CO2: 25 mmol/L (ref 22–32)
CREATININE: 0.93 mg/dL (ref 0.61–1.24)
GFR calc Af Amer: >60 mL/min (ref >60)
GFR calc non Af Amer: >60 mL/min (ref >60)
GLUCOSE: 93 mg/dL (ref 65–99)
Potassium: 3.5 mmol/L (ref 3.5–5.1)
SODIUM: 136 mmol/L (ref 135–145)
Total Bilirubin: 0.7 mg/dL (ref 0.3–1.2)
Total Protein: 7.7 g/dL (ref 6.5–8.1)

## 2017-04-10 LAB — ACETAMINOPHEN LEVEL: Acetaminophen (Tylenol), Serum: <10 ug/mL — ABNORMAL LOW (ref 10–30)

## 2017-04-10 LAB — ETHANOL: Alcohol, Ethyl (B): <5 mg/dL (ref <5)

## 2017-04-10 LAB — SALICYLATE LEVEL: Salicylate Lvl: <7 mg/dL (ref 2.8–30.0)

## 2017-04-10 NOTE — ED Triage Notes (Signed)
Pt brought in by [employer] voluntarily  Pt[facility] but they would not [facility] him because they said he had aggressive behavior  Pt states he has bipolar disorder and extreme social anxiety  Pt would not answer questions appropriately in triage

## 2017-04-10 NOTE — ED Notes (Signed)
Pt came to nursing station and states he wants to leave  Asked if he could go  I explained that he was here on his own accord and he was free to leave at any time  Pt states he want to sign self out  As pt was signing out he was mumbling under his breath  Pt asked why we were throwing him out  Explained we were not trying to throw him out he had asked to leave  Pt states "[facility] and y[facility]all do not want me here"  Again explained to pt he could stay and receive treatment but pt declined and signed out then left dept

## 2017-04-14 ENCOUNTER — Encounter (HOSPITAL_COMMUNITY): Payer: Self-pay

## 2017-04-14 ENCOUNTER — Emergency Department (HOSPITAL_COMMUNITY)
Admission: EM | Admit: 2017-04-14 | Discharge: 2017-04-14 | Disposition: A | Discharge status: Home / Self Care | Payer: Self-pay | Source: Home / Self Care | Attending: Emergency Medicine | Admitting: Emergency Medicine

## 2017-04-14 ENCOUNTER — Emergency Department (HOSPITAL_COMMUNITY)
Admission: EM | Admit: 2017-04-14 | Discharge: 2017-04-16 | Disposition: A | Discharge status: Rehabilitation Facility | Payer: Self-pay | Attending: Emergency Medicine | Admitting: Emergency Medicine

## 2017-04-14 DIAGNOSIS — Z79899 Other long term (current) drug therapy: Secondary | ICD-10-CM | POA: Insufficient documentation

## 2017-04-14 DIAGNOSIS — F3112 Bipolar disorder, current episode manic without psychotic features, moderate: Secondary | ICD-10-CM | POA: Insufficient documentation

## 2017-04-14 DIAGNOSIS — F17210 Nicotine dependence, cigarettes, uncomplicated: Secondary | ICD-10-CM | POA: Insufficient documentation

## 2017-04-14 DIAGNOSIS — F1220 Cannabis dependence, uncomplicated: Secondary | ICD-10-CM | POA: Diagnosis present

## 2017-04-14 DIAGNOSIS — Z7982 Long term (current) use of aspirin: Secondary | ICD-10-CM | POA: Insufficient documentation

## 2017-04-14 DIAGNOSIS — Z7984 Long term (current) use of oral hypoglycemic drugs: Secondary | ICD-10-CM | POA: Insufficient documentation

## 2017-04-14 DIAGNOSIS — F250 Schizoaffective disorder, bipolar type: Secondary | ICD-10-CM | POA: Diagnosis present

## 2017-04-14 LAB — CBC
HCT: 40.5 % (ref 39.0–52.0)
HEMOGLOBIN: 13.3 g/dL (ref 13.0–17.0)
MCH: 27.5 pg (ref 26.0–34.0)
MCHC: 32.8 g/dL (ref 30.0–36.0)
MCV: 83.7 fL (ref 78.0–100.0)
PLATELETS: 288 10*3/uL (ref 150–400)
RBC: 4.84 MIL/uL (ref 4.22–5.81)
RDW: 14.9 % (ref 11.5–15.5)
WBC: 9.5 10*3/uL (ref 4.0–10.5)

## 2017-04-14 LAB — COMPREHENSIVE METABOLIC PANEL
ALK PHOS: 94 U/L (ref 38–126)
ALT: 43 U/L (ref 17–63)
AST: 43 U/L — AB (ref 15–41)
Albumin: 4.5 g/dL (ref 3.5–5.0)
Anion gap: 6 (ref 5–15)
BUN: 13 mg/dL (ref 6–20)
CHLORIDE: 108 mmol/L (ref 101–111)
CO2: 24 mmol/L (ref 22–32)
CREATININE: 0.96 mg/dL (ref 0.61–1.24)
Calcium: 9.4 mg/dL (ref 8.9–10.3)
GFR calc Af Amer: >60 mL/min (ref >60)
Glucose, Bld: 101 mg/dL — ABNORMAL HIGH (ref 65–99)
Potassium: 3.7 mmol/L (ref 3.5–5.1)
Sodium: 138 mmol/L (ref 135–145)
Total Bilirubin: 0.4 mg/dL (ref 0.3–1.2)
Total Protein: 7.7 g/dL (ref 6.5–8.1)

## 2017-04-14 LAB — RAPID URINE DRUG SCREEN, HOSP PERFORMED
AMPHETAMINES: NOT DETECTED
Barbiturates: NOT DETECTED
Benzodiazepines: NOT DETECTED
Cocaine: NOT DETECTED
Opiates: NOT DETECTED
TETRAHYDROCANNABINOL: POSITIVE — AB

## 2017-04-14 LAB — ETHANOL

## 2017-04-14 LAB — CBG MONITORING, ED: GLUCOSE-CAPILLARY: 97 mg/dL (ref 65–99)

## 2017-04-14 MED ORDER — LORAZEPAM 1 MG PO TABS
2.0000 mg | ORAL_TABLET | Freq: Once | ORAL | Status: AC
  Administered 2017-04-14: 2 mg via ORAL
  Filled 2017-04-14: qty 2

## 2017-04-14 MED ORDER — QUETIAPINE FUMARATE 100 MG PO TABS: 200.0000 mg | ORAL_TABLET | Freq: Every day | ORAL | Status: DC

## 2017-04-14 MED ORDER — ONDANSETRON HCL 4 MG PO TABS: 4.0000 mg | ORAL_TABLET | Freq: Three times a day (TID) | ORAL | Status: DC | PRN

## 2017-04-14 MED ORDER — OXCARBAZEPINE 150 MG PO TABS: 150.0000 mg | ORAL_TABLET | Freq: Two times a day (BID) | ORAL | Status: DC

## 2017-04-14 MED ORDER — IBUPROFEN 200 MG PO TABS: 600.0000 mg | ORAL_TABLET | Freq: Three times a day (TID) | ORAL | Status: DC | PRN

## 2017-04-14 MED ORDER — QUETIAPINE FUMARATE 200 MG PO TABS: 200.0000 mg | ORAL_TABLET | Freq: Every day | ORAL | 0 refills | Status: DC

## 2017-04-14 MED ORDER — OXCARBAZEPINE 300 MG PO TABS
300.0000 mg | ORAL_TABLET | Freq: Two times a day (BID) | ORAL | Status: DC
  Administered 2017-04-14 – 2017-04-16 (×3): 300 mg via ORAL
  Filled 2017-04-14 (×5): qty 1

## 2017-04-14 MED ORDER — METFORMIN HCL 500 MG PO TABS
1000.0000 mg | ORAL_TABLET | Freq: Two times a day (BID) | ORAL | Status: DC
  Filled 2017-04-14 (×2): qty 2

## 2017-04-14 MED ORDER — QUETIAPINE FUMARATE 300 MG PO TABS
600.0000 mg | ORAL_TABLET | Freq: Every day | ORAL | Status: DC
  Administered 2017-04-14: 600 mg via ORAL
  Filled 2017-04-14 (×2): qty 2

## 2017-04-14 MED ORDER — ACETAMINOPHEN 325 MG PO TABS: 650.0000 mg | ORAL_TABLET | ORAL | Status: DC | PRN

## 2017-04-14 MED ORDER — LORAZEPAM 2 MG/ML IJ SOLN: 2.0000 mg | Freq: Once | INTRAMUSCULAR | Status: AC

## 2017-04-14 MED ORDER — OXCARBAZEPINE 150 MG PO TABS: 150.0000 mg | ORAL_TABLET | Freq: Two times a day (BID) | ORAL | 0 refills | Status: DC

## 2017-04-14 MED ORDER — HYDROXYZINE HCL 25 MG PO TABS
25.0000 mg | ORAL_TABLET | Freq: Three times a day (TID) | ORAL | Status: DC | PRN
  Administered 2017-04-16: 25 mg via ORAL
  Filled 2017-04-14: qty 1

## 2017-04-14 NOTE — Progress Notes (Signed)
Pt meets criteria for inpt treatment. TTS faxed referrals to the following inpt facilities for review:  [facility], [facility], [facility], [facility], [facility], [facility], [facility], [facility]   TTS will continue to seek bed placement.  Princess BruinsAquicha Ioana Louks, Theresia MajorsLCSWA [phone]
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
## 2017-04-14 NOTE — BH Assessment (Signed)
[facility] Assessment Progress Note Case was staffed with [place]in NP who recommended a inpatient admission as appropriate bed placement is investigated.       

## 2017-04-14 NOTE — ED Notes (Signed)
[facility] Expected date:  Expected time:  Means of arrival:  Comments: 
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
## 2017-04-14 NOTE — ED Notes (Signed)
Pt admitted to room #41, IVC, Pt disorganized, tangential on approach. Denies SI/HI, paranoid. Encouragement and support provided. Special checks q 15 mins in place for safety, Video monitoring in place. Will continue to monitor.

## 2017-04-14 NOTE — ED Notes (Signed)
Patient becoming agitated with his family and began to curse loudly and becoming more restless. Pt instructed to end phone call in order for him to calm down. Patient pacing hallways and balling up his fists multiple times.

## 2017-04-14 NOTE — ED Notes (Signed)
Patient disorganized, difficult to hear and whispering to staff at times. Patient making bizarre statements such as "I'm going to stab the leader of the [place] with a spear and then I will blow up the [place] and bring the world into order". Patient going between speaking spanish, french and english in the same sentences. But within minutes of displaying these behaviors, patient noted to talk to his family on the phone normally, in a clear and coherent manner without difficulty for several minutes. Patient then returning to previous behaviors when talking to staff members individually.

## 2017-04-14 NOTE — ED Notes (Signed)
Bed: [facility] Expected date:  Expected time:  Means of arrival:  Comments: 

## 2017-04-14 NOTE — ED Provider Notes (Signed)
[facility]-EMER[facility]ENCY DEPT Provider Note   CSN: [id number] Arrival date & time: 04/14/17  4098     History   Chief Complaint Chief Complaint  Patient presents with  . Mental Health Problem    HPI George [name] is a 26 y.o. male.  HPI Patient has a history of schizoaffective disorder.  He's been noncompliant with his Seroquel and his Trileptal.  He was found downtown washing his feet in water stating that was was coming out of his feet.  He has no homicidal or suicidal thoughts.  He states his been noncompliant with his medications.  He is interactive and smiling and happy.  He is not agitated.  He reports that he continues to use cannabis and hallucinogenic mushrooms.  He has no other complaints at this time he is brought to the emergency department via law enforcement   Past Medical History:  Diagnosis Date  . ADHD (attention deficit hyperactivity disorder)   . Ankle fracture 01/2017   RIGHT ANKLE  . Anxiety   . Bipolar disorder (HCC)   . Depression   . 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 Schizoaffective disorder, bipolar type (HCC) 02/08/2017  . Cannabis use disorder, severe, dependence (HCC) 02/08/2017  . Trimalleolar fracture of ankle, closed, right, initial encounter 01/26/2017  . Displaced trimalleolar fracture of right lower leg, initial encounter for closed fracture 01/22/2017    Past Surgical History:  Procedure Laterality Date  . NO PAST SURGERIES    . ORIF ANKLE FRACTURE Right 01/31/2017   Procedure: OPEN REDUCTION INTERNAL FIXATION (ORIF) RIGHT TRIMALLEOLAR ANKLE FRACTURE;  Surgeon: [name], MD;  Location: [facility] OR;  Service: Orthopedics;  Laterality: Right;       Home Medications    Prior to Admission medications   Medication Sig Start Date End Date Taking? Authorizing Provider  aspirin EC 325 MG tablet Take 1 tablet (325 mg total) by mouth 2 (two) times daily. 01/31/17   [name], MD  hydrOXYzine  (ATARAX/VISTARIL) 25 MG tablet Take 1 tablet (25 mg total) by mouth 3 (three) times daily as needed for anxiety. Patient taking differently: Take 25 mg by mouth 3 (three) times daily.  03/17/16   Thermon Leyland, NP  metFORMIN (GLUCOPHAGE) 1000 MG tablet Take 1 tablet (1,000 mg total) by mouth 2 (two) times daily with a meal. Patient not taking: Reported on 01/26/2017 03/17/16   Thermon Leyland, NP  OXcarbazepine (TRILEPTAL) 150 MG tablet Take 1 tablet (150 mg total) by mouth 2 (two) times daily. 04/14/17   Azalia Bilis, MD  promethazine (PHENERGAN) 25 MG tablet Take 1 tablet (25 mg total) by mouth every 6 (six) hours as needed for nausea. 01/31/17   [name], MD  QUEtiapine (SEROQUEL) 200 MG tablet Take 1 tablet (200 mg total) by mouth at bedtime. 04/14/17   Azalia Bilis, MD    Family History Family History  Problem Relation Age of Onset  . Cancer Father   . Diabetes Maternal Grandmother   . Alzheimer's disease Paternal Grandmother   . Heart failure Paternal Grandmother   . Heart failure Paternal Grandfather   . Heart failure Maternal Grandfather     Social History Social History  Substance Use Topics  . Smoking status: Current Every Day Smoker    Packs/day: 1.00    Years: 10.00    Types: Cigarettes  . Smokeless tobacco: Never Used     Comment: off and on  .  Alcohol use Yes     Allergies   Amoxicillin and Penicillins   Review of Systems Review of Systems  All other systems reviewed and are negative.    Physical Exam Updated Vital Signs BP (!) 109/53 (BP Location: Left Arm)   Pulse 80   Temp 98.5 F (36.9 C) (Oral)   SpO2 96%   Physical Exam  Constitutional: He is oriented to person, place, and time. He appears well-developed and well-nourished.  HENT:  Head: Normocephalic and atraumatic.  Eyes: EOM are normal.  Neck: Normal range of motion.  Cardiovascular: Normal rate and regular rhythm.   Pulmonary/Chest: Breath sounds normal. No respiratory distress.    Abdominal: Soft. He exhibits no distension.  Musculoskeletal: Normal range of motion.  Neurological: He is alert and oriented to person, place, and time.  Skin: Skin is warm and dry.  Psychiatric: He has a normal mood and affect. His speech is not rapid and/or pressured. He is not agitated, not aggressive and not combative. Thought content is not paranoid. Cognition and memory are normal. He expresses no homicidal and no suicidal ideation. He is attentive.  Nursing note and vitals reviewed.    ED Treatments / Results  Labs (all labs ordered are listed, but only abnormal results are displayed) Labs Reviewed - No data to display  EKG  EKG Interpretation None       Radiology No results found.  Procedures Procedures (including critical care time)  Medications Ordered in ED Medications - No data to display   Initial Impression / Assessment and Plan / ED Course  I have reviewed the triage vital signs and the nursing notes.  Pertinent labs & imaging results that were available during my care of the patient were reviewed by me and considered in my medical decision making (see chart for details).     Patient is medically clear at this time.  No indication for acute psychiatric hospitalization or evaluation.  Patient referred back to [facility].  Patient given prescriptions for Seroquel and Trileptal.  I don't believe the patient is a threat to himself or others  Final Clinical Impressions(s) / ED Diagnoses   Final diagnoses:  Schizoaffective disorder, bipolar type (HCC)    New Prescriptions New Prescriptions   OXCARBAZEPINE (TRILEPTAL) 150 MG TABLET    Take 1 tablet (150 mg total) by mouth 2 (two) times daily.   QUETIAPINE (SEROQUEL) 200 MG TABLET    Take 1 tablet (200 mg total) by mouth at bedtime.     Azalia Bilisampos, Nathanyl Andujo, MD 04/14/17 [phone]

## 2017-04-14 NOTE — ED Triage Notes (Signed)
He was found on a public street sitting on the edge of which washing his feet with a bottle of water. He tells us he thinks "poison is coming out of my (right) foot". He ambulates without difficulty and heads immediately to our bathroom to wash his feet. Two [employer] Officers remains with him at triage.
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
## 2017-04-14 NOTE — ED Triage Notes (Signed)
He returns here (he was seen by Dr. Patria Maneampos earlier this shift) with police, who were notified by pt's. Father d/t psychotic behavior; and he had told police that he had thoughts earlier today of hanging himself with a belt. He was [place] naked.

## 2017-04-15 ENCOUNTER — Encounter (HOSPITAL_COMMUNITY): Payer: Self-pay | Admitting: Registered Nurse

## 2017-04-15 DIAGNOSIS — Z810 Family history of intellectual disabilities: Secondary | ICD-10-CM

## 2017-04-15 DIAGNOSIS — R45851 Suicidal ideations: Secondary | ICD-10-CM

## 2017-04-15 DIAGNOSIS — F1490 Cocaine use, unspecified, uncomplicated: Secondary | ICD-10-CM

## 2017-04-15 DIAGNOSIS — F1290 Cannabis use, unspecified, uncomplicated: Secondary | ICD-10-CM

## 2017-04-15 DIAGNOSIS — F17210 Nicotine dependence, cigarettes, uncomplicated: Secondary | ICD-10-CM

## 2017-04-15 DIAGNOSIS — F3112 Bipolar disorder, current episode manic without psychotic features, moderate: Secondary | ICD-10-CM

## 2017-04-15 MED ORDER — LORAZEPAM 1 MG PO TABS
2.0000 mg | ORAL_TABLET | Freq: Once | ORAL | Status: AC
  Administered 2017-04-15: 2 mg via ORAL
  Filled 2017-04-15: qty 2
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
## 2017-04-15 NOTE — ED Notes (Signed)
Pt in bed, eyes closed, appears to be sleeping. Respirations regular and equal, no s/s of distress noted. Special checks q 15 mins in place for safety, Video monitoring in place. Will continue to monitor.

## 2017-04-15 NOTE — ED Notes (Signed)
Pt becoming increasingly agitated, verbally aggressive, this nurse notified Denice BorsShuvon, NP

## 2017-04-15 NOTE — ED Notes (Signed)
Patient up to bathroom, was noted to be very unsteady. Patient unable to stay awake and was falling asleep while standing in the bathroom. Patient needing two person assist back to his room. Pt's linens were noted to be wet with urine; complete linen change done and patient helped back into the bed. Patient asleep and without any signs of distress noted. Pt. with regular and even respirations.

## 2017-04-15 NOTE — Consult Note (Signed)
[place] [facility] Consult   Reason for Consult:  IVC'd for thoughts of self harm and suicidal ideation Referring Physician:  EDP Patient Identification: George [name] MRN:  [id number] Principal Diagnosis: <principal problem not specified> Diagnosis:   Patient Active Problem List   Diagnosis Date Noted  . Schizoaffective disorder, bipolar type ([place]) [F25.0] 02/08/2017  . Cannabis use disorder, severe, dependence ([place]) [F12.20] 02/08/2017  . Trimalleolar fracture of ankle, closed, right, initial encounter [S82.851A] 01/26/2017  . Displaced trimalleolar fracture of right lower leg, initial encounter for closed fracture [S82.851A] 01/22/2017    Total Time spent with patient: 45 minutes  Subjective:   George [name] is a 26 y.o. male patient presented to [facility] under IVC.  HPI:  26 yo male patient seen by Dr. Daron Offer and this provider.  Chart reviewed and face to face evaluation on 04/15/17.   On evaluation:  George [name] sleeping and unable to give any information.  Tried to wake patient but unable to arouse patient .  Nursing states that patient was given Ativan 2 mg last night and has been sleeping every since (very manic at arrival and hadn't had any sleep).  Did get up once to go to bathroom but urinated on himself and needed assistance back to bed.     Collaboration with mother:  Reports that this is the worse she has ever seen patient.  Reports that he was found in the back of someone's back yard trying to take a bath with their water hose.  They called the police.  Reports that patient does not take his medication and that he is not allowed in her home because she is afraid of being in the house with him.   Past Psychiatric History: schizoaffective disorder, substance abuse  Risk to Self: Suicidal Ideation: Yes-Currently Present Suicidal Intent: Yes-Currently Present Is patient at risk for suicide?: Yes Suicidal Plan?: Yes-Currently Present Specify Current Suicidal  Plan: Hang himself Access to Means: Yes Specify Access to Suicidal Means: pt stated he has a rope What has been your use of drugs/alcohol within the last 12 months?: Denies current use How many times?: 1 Other Self Harm Risks: NA Triggers for Past Attempts: Unknown Intentional Self Injurious Behavior: None Risk to Others: Homicidal Ideation: No Thoughts of Harm to Others: No Current Homicidal Intent: No Current Homicidal Plan: No Access to Homicidal Means: No Identified Victim: NA History of harm to others?: No Assessment of Violence: None Noted Violent Behavior Description: NA Does patient have access to weapons?: No Criminal Charges Pending?: No Does patient have a court date: No Prior Inpatient Therapy: Prior Inpatient Therapy: Yes Prior Therapy Dates: 2018, 2017 Prior Therapy Facilty/Provider(s): [place], [facility] Reason for Treatment: MH issues Prior Outpatient Therapy: Prior Outpatient Therapy: Yes Prior Therapy Dates: 2018 Prior Therapy Facilty/Provider(s): [facility] Reason for Treatment: MH issues Does patient have an ACCT team?: No Does patient have Intensive In-House Services?  : No Does patient have [facility] services? : Yes Does patient have P4CC services?: No  Past Medical History:  Past Medical History:  Diagnosis Date  . ADHD (attention deficit hyperactivity disorder)   . Ankle fracture 01/2017   RIGHT ANKLE  . Anxiety   . Bipolar disorder ([place])   . Depression   . 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 NO PAST SURGERIES    . ORIF ANKLE FRACTURE Right 01/31/2017   Procedure: OPEN REDUCTION INTERNAL FIXATION (ORIF) RIGHT TRIMALLEOLAR ANKLE FRACTURE;  Surgeon: Leandrew Koyanagi, MD;  Location: [facility];  Service: Orthopedics;  Laterality: Right;   Family History:  Family History  Problem Relation Age of Onset  . Cancer Father   . Diabetes Maternal Grandmother   . Alzheimer's disease Paternal Grandmother   . Heart failure  Paternal Grandmother   . Heart failure Paternal Grandfather   . Heart failure Maternal Grandfather    Family Psychiatric  History: none Social History:  History  Alcohol Use  . Yes     History  Drug Use  . Types: Marijuana, Cocaine    Comment: heroin    Social History   Social History  . Marital status: Single    Spouse name: N/A  . Number of children: N/A  . Years of education: N/A   Social History Main Topics  . Smoking status: Current Every Day Smoker    Packs/day: 1.00    Years: 10.00    Types: Cigarettes  . Smokeless tobacco: Never Used     Comment: off and on  . Alcohol use Yes  . Drug use: Yes    Types: Marijuana, Cocaine     Comment: heroin  . Sexual activity: Not Asked     Comment: Unknown   Other Topics Concern  . None   Social History Narrative  . None   Additional Social History:    Allergies:   Allergies  Allergen Reactions  . Amoxicillin Swelling and Rash    "throat started to close up" Has patient had a PCN reaction causing immediate rash, facial/tongue/throat swelling, SOB or lightheadedness with hypotension: Yes Has patient had a PCN reaction causing severe rash involving mucus membranes or skin necrosis: Yes Has patient had a PCN reaction that required hospitalization No Has patient had a PCN reaction occurring within the last 10 years: No If all of the above answers are "NO", then may proceed with Cephalosporin use.   Marland Kitchen Penicillins Rash and Other (See Comments)    Has patient had a PCN reaction causing immediate rash, facial/tongue/throat swelling, SOB or lightheadedness with hypotension: unsure Has patient had a PCN reaction causing severe rash involving mucus membranes or skin necrosis: Unsure Has patient had a PCN reaction that required hospitalization Unsure Has patient had a PCN reaction occurring within the last 10 years: No If all of the above answers are "NO", then may proceed with Cephalosporin use.    Labs:  Results for  orders placed or performed during the hospital encounter of 04/14/17 (from the past 48 hour(s))  Rapid urine drug screen (hospital performed)     Status: Abnormal   Collection Time: 04/14/17 12:47 PM  Result Value Ref Range   Opiates NONE DETECTED NONE DETECTED   Cocaine NONE DETECTED NONE DETECTED   Benzodiazepines NONE DETECTED NONE DETECTED   Amphetamines NONE DETECTED NONE DETECTED   Tetrahydrocannabinol POSITIVE (A) NONE DETECTED   Barbiturates NONE DETECTED NONE DETECTED    Comment:        DRUG SCREEN FOR MEDICAL PURPOSES ONLY.  IF CONFIRMATION IS NEEDED FOR ANY PURPOSE, NOTIFY LAB WITHIN 5 DAYS.        LOWEST DETECTABLE LIMITS FOR URINE DRUG SCREEN Drug Class       Cutoff (ng/mL) Amphetamine      1000 Barbiturate      200 Benzodiazepine   197 Tricyclics       588 Opiates          300 Cocaine  300 THC              50   CBC     Status: None   Collection Time: 04/14/17 12:54 PM  Result Value Ref Range   WBC 9.5 4.0 - 10.5 K/uL   RBC 4.84 4.22 - 5.81 MIL/uL   Hemoglobin 13.3 13.0 - 17.0 g/dL   HCT 40.5 39.0 - 52.0 %   MCV 83.7 78.0 - 100.0 fL   MCH 27.5 26.0 - 34.0 pg   MCHC 32.8 30.0 - 36.0 g/dL   RDW 14.9 11.5 - 15.5 %   Platelets 288 150 - 400 K/uL  Comprehensive metabolic panel     Status: Abnormal   Collection Time: 04/14/17 12:54 PM  Result Value Ref Range   Sodium 138 135 - 145 mmol/L   Potassium 3.7 3.5 - 5.1 mmol/L   Chloride 108 101 - 111 mmol/L   CO2 24 22 - 32 mmol/L   Glucose, Bld 101 (H) 65 - 99 mg/dL   BUN 13 6 - 20 mg/dL   Creatinine, Ser 0.96 0.61 - 1.24 mg/dL   Calcium 9.4 8.9 - 10.3 mg/dL   Total Protein 7.7 6.5 - 8.1 g/dL   Albumin 4.5 3.5 - 5.0 g/dL   AST 43 (H) 15 - 41 U/L   ALT 43 17 - 63 U/L   Alkaline Phosphatase 94 38 - 126 U/L   Total Bilirubin 0.4 0.3 - 1.2 mg/dL   GFR calc non Af Amer >60 >60 mL/min   GFR calc Af Amer >60 >60 mL/min    Comment: (NOTE) The eGFR has been calculated using the CKD EPI equation. This  calculation has not been validated in all clinical situations. eGFR's persistently <60 mL/min signify possible Chronic Kidney Disease.    Anion gap 6 5 - 15  Ethanol     Status: None   Collection Time: 04/14/17 12:54 PM  Result Value Ref Range   Alcohol, Ethyl (B) <5 <5 mg/dL    Comment:        LOWEST DETECTABLE LIMIT FOR SERUM ALCOHOL IS 5 mg/dL FOR MEDICAL PURPOSES ONLY   CBG monitoring, ED     Status: None   Collection Time: 04/14/17  5:36 PM  Result Value Ref Range   Glucose-Capillary 97 65 - 99 mg/dL    Current Facility-Administered Medications  Medication Dose Route Frequency Provider Last Rate Last Dose  . acetaminophen (TYLENOL) tablet 650 mg  650 mg Oral Q4H PRN Jola Schmidt, MD      . hydrOXYzine (ATARAX/VISTARIL) tablet 25 mg  25 mg Oral TID PRN Jola Schmidt, MD      . ibuprofen (ADVIL,MOTRIN) tablet 600 mg  600 mg Oral Q8H PRN Jola Schmidt, MD      . metFORMIN (GLUCOPHAGE) tablet 1,000 mg  1,000 mg Oral BID WC Jola Schmidt, MD      . ondansetron [facility]) tablet 4 mg  4 mg Oral Q8H PRN Jola Schmidt, MD      . Oxcarbazepine (TRILEPTAL) tablet 300 mg  300 mg Oral BID Jola Schmidt, MD   300 mg at 04/14/17 2034  . QUEtiapine (SEROQUEL) tablet 600 mg  600 mg Oral Dolly Rias, MD   600 mg at 04/14/17 2034   Current Outpatient Prescriptions  Medication Sig Dispense Refill  . aspirin EC 325 MG tablet Take 1 tablet (325 mg total) by mouth 2 (two) times daily. 84 tablet 0  . hydrOXYzine (ATARAX/VISTARIL) 25 MG tablet Take 1 tablet (25  mg total) by mouth 3 (three) times daily as needed for anxiety. (Patient taking differently: Take 25 mg by mouth 3 (three) times daily. ) 30 tablet 0  . OXcarbazepine (TRILEPTAL) 150 MG tablet Take 1 tablet (150 mg total) by mouth 2 (two) times daily. 60 tablet 0  . promethazine (PHENERGAN) 25 MG tablet Take 1 tablet (25 mg total) by mouth every 6 (six) hours as needed for nausea. 30 tablet 1  . QUEtiapine (SEROQUEL) 200 MG tablet Take 1  tablet (200 mg total) by mouth at bedtime. 30 tablet 0    Musculoskeletal: Strength & Muscle Tone: unable to assess at this time Gait & Station: Unable to assess at this time Patient leans: N/A  Psychiatric Specialty Exam: Physical Exam  Constitutional: He appears well-developed and well-nourished.  HENT:  Head: Normocephalic.  Neck: Normal range of motion.  Respiratory: Effort normal.  Psychiatric: He has a normal mood and affect. His speech is normal and behavior is normal. Judgment and thought content normal. Cognition and memory are normal.    Review of Systems  Psychiatric/Behavioral: Positive for substance abuse.  All other systems reviewed and are negative.   Blood pressure 120/68, pulse 81, temperature 98.6 F (37 C), temperature source Oral, resp. rate 18, SpO2 98 %.There is no height or weight on file to calculate BMI.  General Appearance: Disheveled  Eye Contact:  Sleeping  Speech:  Unable to assess at this time patient is sleepin and unable to wake him  Volume:  Patient is sleeping unable to assess at this time  Mood:  Patient is sleeping unable to assess at this time  Affect:  Patient is sleeping unable to assess at this time  Thought Process:  NA  Unable to determine at this time  Orientation:  Full (Time, Place, and Person)  Thought Content:  Hallucinations: Auditory  Suicidal Thoughts:  Yes.  with intent/plan  Homicidal Thoughts:  No  Memory:  Patient is sleeping unable to assess at this time  Judgement:  Other:  Patient is sleeping unable to assess at this time  Insight:  Patient is sleeping unable to assess at this time  Psychomotor Activity:  Patient is sleeping unable to assess at this time  Concentration:  Concentration: Good and Attention Span: Good  Recall:  Unable to determine at this time  Fund of Knowledge:  Patient is sleeping unable to assess at this time  Language:  Patient is sleeping unable to assess at this time  Akathisia:  Patient is  sleeping unable to assess at this time  Handed:  Right  AIMS (if indicated):     Assets:  Leisure Time Physical Health Resilience  ADL's:  Intact  Cognition:  WNL  Sleep:        Treatment Plan Summary: Daily contact with patient to assess and evaluate symptoms and progress in treatment and Medication management  -Crisis stabilization -Medication management: Restarted  Seroquel 600 mg Q hs Trileptal 300 mg Bid Metformin 1000 mg Bid Vistaril   Disposition: Recommend psychiatric Inpatient admission when medically cleared.  Cadince Hilscher, NP 04/15/2017 10:41 AM

## 2017-04-15 NOTE — ED Notes (Signed)
SBAR Report received from previous nurse. Pt received  visible on unit. Pt denies current SI/ HI, A/V H, depression, anxiety, or pain at this time, and appears otherwise stable and free of distress. Pt reminded of camera surveillance, q 15 min rounds, and rules of the milieu. Will continue to assess.

## 2017-04-15 NOTE — [facility] Counselor (Signed)
Pt currently sleeping, vital signs not taken. Pt's nurse was notified.

## 2017-04-16 DIAGNOSIS — X838XXA Intentional self-harm by other specified means, initial encounter: Secondary | ICD-10-CM

## 2017-04-16 DIAGNOSIS — G479 Sleep disorder, unspecified: Secondary | ICD-10-CM

## 2017-04-16 DIAGNOSIS — F39 Unspecified mood [affective] disorder: Secondary | ICD-10-CM

## 2017-04-16 DIAGNOSIS — T1491XA Suicide attempt, initial encounter: Secondary | ICD-10-CM

## 2017-04-16 DIAGNOSIS — F250 Schizoaffective disorder, bipolar type: Secondary | ICD-10-CM

## 2017-04-16 DIAGNOSIS — R45850 Homicidal ideations: Secondary | ICD-10-CM

## 2017-04-16 MED ORDER — NICOTINE 21 MG/24HR TD PT24
21.0000 mg | MEDICATED_PATCH | Freq: Once | TRANSDERMAL | Status: DC
  Administered 2017-04-16: 21 mg via TRANSDERMAL
  Filled 2017-04-16: qty 1

## 2017-04-16 NOTE — Progress Notes (Signed)
04/16/17 1410:  LRT introduced self to pt and offered activities.  Pt stated he would be interested in participating.  LRT met pt in dayroom.  LRT played [place] and cards with pt and peer in dayroom.  Pt taught LRT and peer how to play blackjack.  Pt expressed how he was becoming restless and was ready to be transferred to wherever he is going.  Pt was pleasant.   Victorino Sparrow, LRT/CTRS

## 2017-04-16 NOTE — Consult Note (Signed)
[name] Consult   Reason for Consult:  Suicide plan Referring Physician:  EDP Patient Identification: George [name] MRN:  [id number] Principal Diagnosis: Schizoaffective disorder, bipolar type [facility]) Diagnosis:   Patient Active Problem List   Diagnosis Date Noted  . Schizoaffective disorder, bipolar type ([place]) [F25.0] 02/08/2017    Priority: High  . Cannabis use disorder, severe, dependence ([place]) [F12.20] 02/08/2017    Priority: High  . Bipolar affective disorder, currently manic, moderate ([place]) [F31.12]   . Trimalleolar fracture of ankle, closed, right, initial encounter [S82.851A] 01/26/2017  . Displaced trimalleolar fracture of right lower leg, initial encounter for closed fracture [S82.851A] 01/22/2017    Total Time spent with patient: 45 minutes  Subjective:   George [name] is a 26 y.o. male patient admitted with suicide attempt.  HPI:  26 yo male who presented to the ED after planning to put a belt around his neck in a suicide attempt.  He has also been threatening his father.  Labile, tangential, grandiose, difficult to redirect, inconsistent with medication compliance.  Minimizes his actions and wants to discharge but not stable.  Past Psychiatric History: schizoaffective disorder, bipolar type  Risk to Self: Suicidal Ideation: Yes-Currently Present Suicidal Intent: Yes-Currently Present Is patient at risk for suicide?: Yes Suicidal Plan?: Yes-Currently Present Specify Current Suicidal Plan: Hang himself Access to Means: Yes Specify Access to Suicidal Means: pt stated he has a rope What has been your use of drugs/alcohol within the last 12 months?: Denies current use How many times?: 1 Other Self Harm Risks: NA Triggers for Past Attempts: Unknown Intentional Self Injurious Behavior: None Risk to Others: Homicidal Ideation: No Thoughts of Harm to Others: No Current Homicidal Intent: No Current Homicidal Plan: No Access to Homicidal Means:  No Identified Victim: NA History of harm to others?: No Assessment of Violence: None Noted Violent Behavior Description: NA Does patient have access to weapons?: No Criminal Charges Pending?: No Does patient have a court date: No Prior Inpatient Therapy: Prior Inpatient Therapy: Yes Prior Therapy Dates: 2018, 2017 Prior Therapy Facilty/Provider(s): [place], [facility] Reason for Treatment: MH issues Prior Outpatient Therapy: Prior Outpatient Therapy: Yes Prior Therapy Dates: 2018 Prior Therapy Facilty/Provider(s): [facility] Reason for Treatment: MH issues Does patient have an ACCT team?: No Does patient have Intensive In-House Services?  : No Does patient have [facility] services? : Yes Does patient have P4CC services?: No  Past Medical History:  Past Medical History:  Diagnosis Date  . ADHD (attention deficit hyperactivity disorder)   . Ankle fracture 01/2017   RIGHT ANKLE  . Anxiety   . Bipolar disorder ([place])   . Depression   . 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 NO PAST SURGERIES    . ORIF ANKLE FRACTURE Right 01/31/2017   Procedure: OPEN REDUCTION INTERNAL FIXATION (ORIF) RIGHT TRIMALLEOLAR ANKLE FRACTURE;  Surgeon: Leandrew Koyanagi, MD;  Location: [place];  Service: Orthopedics;  Laterality: Right;   Family History:  Family History  Problem Relation Age of Onset  . Cancer Father   . Diabetes Maternal Grandmother   . Alzheimer's disease Paternal Grandmother   . Heart failure Paternal Grandmother   . Heart failure Paternal Grandfather   . Heart failure Maternal Grandfather    Family Psychiatric  History: none  Social History:  History  Alcohol Use  . Yes     History  Drug Use  . Types: Marijuana, Cocaine    Comment: heroin  Social History   Social History  . Marital status: Single    Spouse name: N/A  . Number of children: N/A  . Years of education: N/A   Social History Main Topics  . Smoking status: Current Every  Day Smoker    Packs/day: 1.00    Years: 10.00    Types: Cigarettes  . Smokeless tobacco: Never Used     Comment: off and on  . Alcohol use Yes  . Drug use: Yes    Types: Marijuana, Cocaine     Comment: heroin  . Sexual activity: Not Asked     Comment: Unknown   Other Topics Concern  . None   Social History Narrative  . None   Additional Social History:    Allergies:   Allergies  Allergen Reactions  . Amoxicillin Swelling and Rash    "throat started to close up" Has patient had a PCN reaction causing immediate rash, facial/tongue/throat swelling, SOB or lightheadedness with hypotension: Yes Has patient had a PCN reaction causing severe rash involving mucus membranes or skin necrosis: Yes Has patient had a PCN reaction that required hospitalization No Has patient had a PCN reaction occurring within the last 10 years: No If all of the above answers are "NO", then may proceed with Cephalosporin use.   Marland Kitchen Penicillins Rash and Other (See Comments)    Has patient had a PCN reaction causing immediate rash, facial/tongue/throat swelling, SOB or lightheadedness with hypotension: unsure Has patient had a PCN reaction causing severe rash involving mucus membranes or skin necrosis: Unsure Has patient had a PCN reaction that required hospitalization Unsure Has patient had a PCN reaction occurring within the last 10 years: No If all of the above answers are "NO", then may proceed with Cephalosporin use.    Labs:  Results for orders placed or performed during the hospital encounter of 04/14/17 (from the past 48 hour(s))  Rapid urine drug screen (hospital performed)     Status: Abnormal   Collection Time: 04/14/17 12:47 PM  Result Value Ref Range   Opiates NONE DETECTED NONE DETECTED   Cocaine NONE DETECTED NONE DETECTED   Benzodiazepines NONE DETECTED NONE DETECTED   Amphetamines NONE DETECTED NONE DETECTED   Tetrahydrocannabinol POSITIVE (A) NONE DETECTED   Barbiturates NONE  DETECTED NONE DETECTED    Comment:        DRUG SCREEN FOR MEDICAL PURPOSES ONLY.  IF CONFIRMATION IS NEEDED FOR ANY PURPOSE, NOTIFY LAB WITHIN 5 DAYS.        LOWEST DETECTABLE LIMITS FOR URINE DRUG SCREEN Drug Class       Cutoff (ng/mL) Amphetamine      1000 Barbiturate      200 Benzodiazepine   476 Tricyclics       546 Opiates          300 Cocaine          300 THC              50   CBC     Status: None   Collection Time: 04/14/17 12:54 PM  Result Value Ref Range   WBC 9.5 4.0 - 10.5 K/uL   RBC 4.84 4.22 - 5.81 MIL/uL   Hemoglobin 13.3 13.0 - 17.0 g/dL   HCT 40.5 39.0 - 52.0 %   MCV 83.7 78.0 - 100.0 fL   MCH 27.5 26.0 - 34.0 pg   MCHC 32.8 30.0 - 36.0 g/dL   RDW 14.9 11.5 - 15.5 %   Platelets 288  150 - 400 K/uL  Comprehensive metabolic panel     Status: Abnormal   Collection Time: 04/14/17 12:54 PM  Result Value Ref Range   Sodium 138 135 - 145 mmol/L   Potassium 3.7 3.5 - 5.1 mmol/L   Chloride 108 101 - 111 mmol/L   CO2 24 22 - 32 mmol/L   Glucose, Bld 101 (H) 65 - 99 mg/dL   BUN 13 6 - 20 mg/dL   Creatinine, Ser 0.96 0.61 - 1.24 mg/dL   Calcium 9.4 8.9 - 10.3 mg/dL   Total Protein 7.7 6.5 - 8.1 g/dL   Albumin 4.5 3.5 - 5.0 g/dL   AST 43 (H) 15 - 41 U/L   ALT 43 17 - 63 U/L   Alkaline Phosphatase 94 38 - 126 U/L   Total Bilirubin 0.4 0.3 - 1.2 mg/dL   GFR calc non Af Amer >60 >60 mL/min   GFR calc Af Amer >60 >60 mL/min    Comment: (NOTE) The eGFR has been calculated using the CKD EPI equation. This calculation has not been validated in all clinical situations. eGFR's persistently <60 mL/min signify possible Chronic Kidney Disease.    Anion gap 6 5 - 15  Ethanol     Status: None   Collection Time: 04/14/17 12:54 PM  Result Value Ref Range   Alcohol, Ethyl (B) <5 <5 mg/dL    Comment:        LOWEST DETECTABLE LIMIT FOR SERUM ALCOHOL IS 5 mg/dL FOR MEDICAL PURPOSES ONLY   CBG monitoring, ED     Status: None   Collection Time: 04/14/17  5:36 PM  Result  Value Ref Range   Glucose-Capillary 97 65 - 99 mg/dL    Current Facility-Administered Medications  Medication Dose Route Frequency Provider Last Rate Last Dose  . acetaminophen (TYLENOL) tablet 650 mg  650 mg Oral Q4H PRN Jola Schmidt, MD      . hydrOXYzine (ATARAX/VISTARIL) tablet 25 mg  25 mg Oral TID PRN Jola Schmidt, MD   25 mg at 04/16/17 0626  . ibuprofen (ADVIL,MOTRIN) tablet 600 mg  600 mg Oral Q8H PRN Jola Schmidt, MD      . metFORMIN (GLUCOPHAGE) tablet 1,000 mg  1,000 mg Oral BID WC Jola Schmidt, MD      . ondansetron [facility]) tablet 4 mg  4 mg Oral Q8H PRN Jola Schmidt, MD      . Oxcarbazepine (TRILEPTAL) tablet 300 mg  300 mg Oral BID Jola Schmidt, MD   300 mg at 04/16/17 1016  . QUEtiapine (SEROQUEL) tablet 600 mg  600 mg Oral Dolly Rias, MD   600 mg at 04/14/17 2034   Current Outpatient Prescriptions  Medication Sig Dispense Refill  . aspirin EC 325 MG tablet Take 1 tablet (325 mg total) by mouth 2 (two) times daily. 84 tablet 0  . hydrOXYzine (ATARAX/VISTARIL) 25 MG tablet Take 1 tablet (25 mg total) by mouth 3 (three) times daily as needed for anxiety. (Patient taking differently: Take 25 mg by mouth 3 (three) times daily. ) 30 tablet 0  . OXcarbazepine (TRILEPTAL) 150 MG tablet Take 1 tablet (150 mg total) by mouth 2 (two) times daily. 60 tablet 0  . promethazine (PHENERGAN) 25 MG tablet Take 1 tablet (25 mg total) by mouth every 6 (six) hours as needed for nausea. 30 tablet 1  . QUEtiapine (SEROQUEL) 200 MG tablet Take 1 tablet (200 mg total) by mouth at bedtime. 30 tablet 0    Musculoskeletal: Strength &  Muscle Tone: within normal limits Gait & Station: normal Patient leans: N/A  Psychiatric Specialty Exam: Physical Exam  Constitutional: He is oriented to person, place, and time. He appears well-developed and well-nourished.  HENT:  Head: Normocephalic.  Neck: Normal range of motion.  Respiratory: Effort normal.  Musculoskeletal: Normal range of  motion.  Neurological: He is alert and oriented to person, place, and time.  Psychiatric: His mood appears anxious. His affect is labile. His speech is rapid and/or pressured and tangential. Thought content is delusional. Cognition and memory are impaired. He expresses impulsivity. He expresses homicidal and suicidal ideation. He expresses suicidal plans. He is inattentive.    Review of Systems  Psychiatric/Behavioral: Positive for depression and suicidal ideas. The patient is nervous/anxious.   All other systems reviewed and are negative.   Blood pressure 129/87, pulse 88, temperature 97.9 F (36.6 C), temperature source Oral, resp. rate 17, SpO2 98 %.There is no height or weight on file to calculate BMI.  General Appearance: Casual  Eye Contact:  Fair  Speech:  Pressured at times  Volume:  Normal  Mood:  Anxious, Depressed and Irritable  Affect:  Blunt  Thought Process:  Coherent and Descriptions of Associations: Tangential  Orientation:  Full (Time, Place, and Person)  Thought Content:  Delusions, Paranoid Ideation and Tangential  Suicidal Thoughts:  Yes.  with intent/plan  Homicidal Thoughts:  Yes.  without intent/plan  Memory:  Immediate;   Fair Recent;   Fair Remote;   Fair  Judgement:  Impaired  Insight:  Lacking  Psychomotor Activity:  Increased  Concentration:  Concentration: Poor and Attention Span: Poor  Recall:  [employer] of Knowledge:  Fair  Language:  Good  Akathisia:  No  Handed:  Right  AIMS (if indicated):     Assets:  Leisure Time Physical Health Resilience Social Support  ADL's:  Intact  Cognition:  Impaired,  Mild  Sleep:        Treatment Plan Summary: Daily contact with patient to assess and evaluate symptoms and progress in treatment, Medication management and Plan schizoaffective disorder, bipolar type:  -Crisis stabilization -Medication management:  Restart medications along with Trileptal 300 mg BID for mood stabilization, Seroquel 600 mg at  bedtime for mood and sleep, and Vistaril 25 mg TID PRN anxiety -Individual counseling -Transfer to Inpatient psychiatric unit  Disposition: Recommend psychiatric Inpatient admission when medically cleared.  Waylan Boga, NP 04/16/2017 12:01 PM  Patient seen face-to-face for psychiatric evaluation, chart reviewed and case discussed with the physician extender and developed treatment plan. Reviewed the information documented and agree with the treatment plan. Corena Pilgrim, MD

## 2017-04-16 NOTE — ED Notes (Signed)
Pt is participating in Recreational Therapy.
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
## 2017-04-16 NOTE — ED Provider Notes (Signed)
[facility] Dr Maryelizabeth KaufmannMunoz.    Pt alert, content, agreeable w plan.   Vitals:   04/15/17 2052 04/16/17 0618  BP: (!) 118/52 129/87  Pulse: 100 88  Resp: 20 17  Temp: 97.8 F (36.6 C) 97.9 F (36.6 C)   Patient appears stable for transfer.    Cathren LaineSteinl, Miley Lindon, MD 04/16/17 1622

## 2017-04-16 NOTE — BH Assessment (Signed)
[facility] Assessment Progress Note  Per Thedore MinsMojeed Akintayo, MD, this pt requires psychiatric hospitalization at this time.  Pt presents under IVC initiated by EDP Azalia BilisKevin Campos, MD.  At 15:48 [place] calls from [facility] to report that pt has been accepted to their facility by Dr Ulice Brilliantigardy Munoz to Rm [id number].  EDP Cathren LaineKevin Steinl, MD, concurs with this decision.  Pt's nurse, Diane, has been notified, and agrees to call report to [phone].  [facility]  Doylene Canninghomas Kodi Steil, MA Triage Specialist [phone]

## 2017-04-16 NOTE — Discharge Instructions (Signed)
[facility], Dr Maryelizabeth KaufmannMunoz accepting physician.

## 2017-04-16 NOTE — ED Notes (Signed)
Patient left in care of [employer] with his belongings. Patient is ambulatory and in a stable condition.
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
## 2018-03-06 IMAGING — CR DG CHEST 2V
2 series · 2 of 2 positions shown · non-contrast
Comparison: None.

CLINICAL DATA: Altered mental status today.  Initial encounter.

EXAM:
CHEST  2 VIEW

[w chest pa]
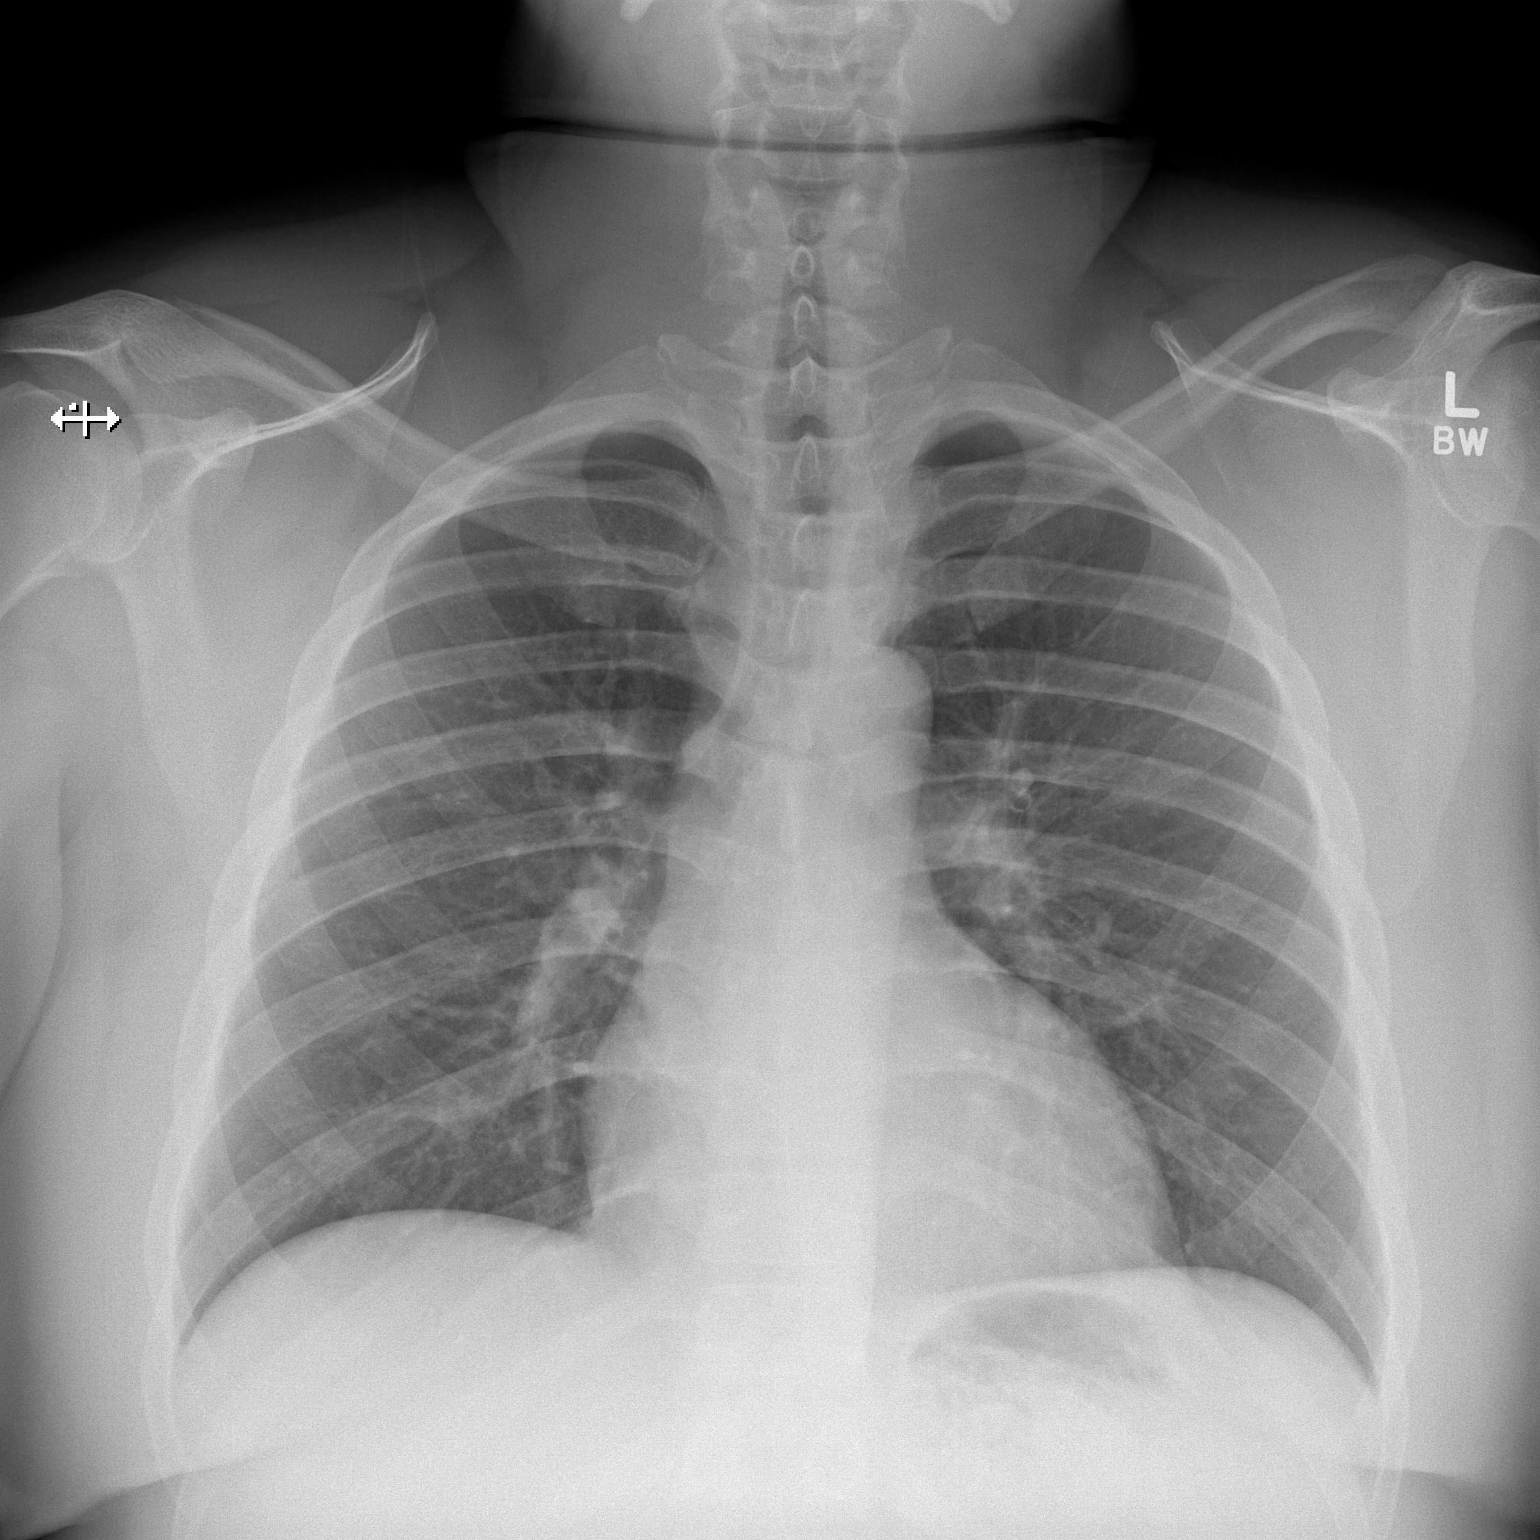

[w chest lat]
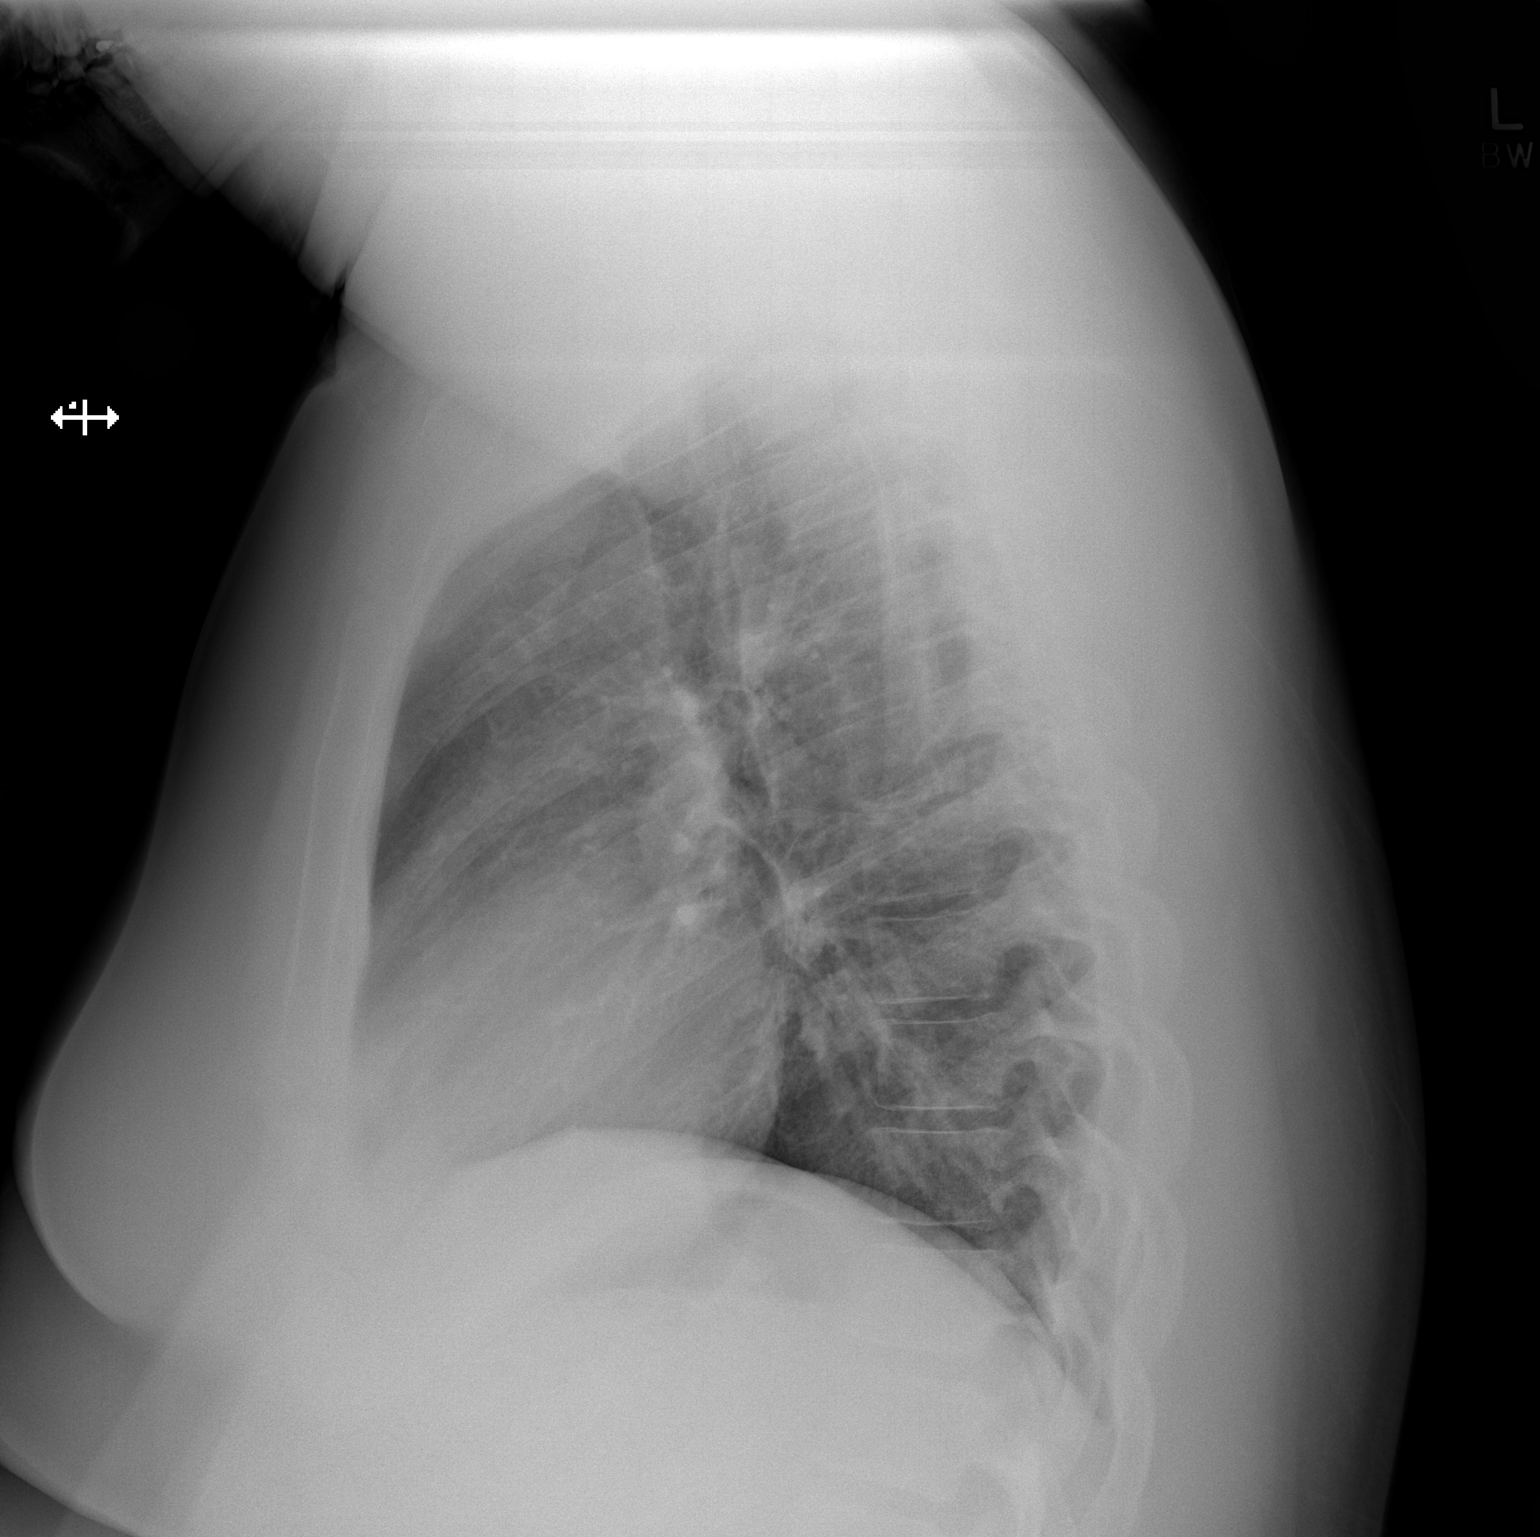

[2 of 2 positions shown; findings below may reference images not displayed]

FINDINGS: The lungs are clear. Heart size is normal. No pneumothorax or
pleural effusion. No bony abnormality.
IMPRESSION: Normal chest.

## 2018-04-30 ENCOUNTER — Ambulatory Visit (HOSPITAL_COMMUNITY)
Admission: EM | Admit: 2018-04-30 | Discharge: 2018-04-30 | Disposition: A | Discharge status: Home / Self Care | Payer: Self-pay | Attending: Family Medicine | Admitting: Family Medicine

## 2018-04-30 ENCOUNTER — Other Ambulatory Visit: Payer: Self-pay

## 2018-04-30 ENCOUNTER — Encounter (HOSPITAL_COMMUNITY): Payer: Self-pay | Admitting: Emergency Medicine

## 2018-04-30 DIAGNOSIS — Z202 Contact with and (suspected) exposure to infections with a predominantly sexual mode of transmission: Secondary | ICD-10-CM | POA: Insufficient documentation

## 2018-04-30 DIAGNOSIS — R300 Dysuria: Secondary | ICD-10-CM | POA: Insufficient documentation

## 2018-04-30 MED ORDER — AZITHROMYCIN 250 MG PO TABS
1000.0000 mg | ORAL_TABLET | Freq: Once | ORAL | Status: AC
  Administered 2018-04-30: 1000 mg via ORAL

## 2018-04-30 MED ORDER — AZITHROMYCIN 250 MG PO TABS
ORAL_TABLET | ORAL | Status: AC
  Filled 2018-04-30: qty 4

## 2018-04-30 MED ORDER — CEFTRIAXONE SODIUM 250 MG IJ SOLR
INTRAMUSCULAR | Status: AC
  Filled 2018-04-30: qty 250

## 2018-04-30 MED ORDER — CEFTRIAXONE SODIUM 250 MG IJ SOLR
250.0000 mg | Freq: Once | INTRAMUSCULAR | Status: AC
  Administered 2018-04-30: 250 mg via INTRAMUSCULAR

## 2018-04-30 NOTE — ED Triage Notes (Signed)
Burning with urination x 2 days ago.  No penile discharge.  Patient is bisexual.

## 2018-04-30 NOTE — Discharge Instructions (Signed)
Drink plenty of fluids. We did lab testing during this visit.  If there are any abnormal findings that require change in medicine or indicate a positive result, you will be notified.  If all of your tests are normal, you will not be called.
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
## 2018-05-01 LAB — URINE CYTOLOGY ANCILLARY ONLY
Chlamydia: NEGATIVE
Neisseria Gonorrhea: NEGATIVE
Trichomonas: NEGATIVE

## 2018-05-02 LAB — URINE CULTURE

## 2018-05-03 ENCOUNTER — Telehealth (HOSPITAL_COMMUNITY): Payer: Self-pay

## 2018-05-03 NOTE — Telephone Encounter (Signed)
Negative std and uti. Pt contacted and made aware. Answered all questions.

## 2020-04-09 ENCOUNTER — Encounter (HOSPITAL_COMMUNITY): Payer: Self-pay | Admitting: Psychiatry

## 2020-04-09 ENCOUNTER — Ambulatory Visit (HOSPITAL_COMMUNITY)
Admission: AD | Admit: 2020-04-09 | Discharge: 2020-04-09 | Disposition: A | Discharge status: Home / Self Care | Payer: Federal, State, Local not specified - Other | Attending: Psychiatry | Admitting: Psychiatry

## 2020-04-09 DIAGNOSIS — F17210 Nicotine dependence, cigarettes, uncomplicated: Secondary | ICD-10-CM | POA: Insufficient documentation

## 2020-04-09 DIAGNOSIS — F209 Schizophrenia, unspecified: Secondary | ICD-10-CM | POA: Insufficient documentation

## 2020-04-09 DIAGNOSIS — F329 Major depressive disorder, single episode, unspecified: Secondary | ICD-10-CM | POA: Insufficient documentation

## 2020-04-09 NOTE — BH Assessment (Signed)
Assessment Note  George [name] is an 29 y.o. male presenting voluntarily to [facility] for assessment. Patient states he is a patient at [facility] and they sent him to [facility] for refills. Patient denies SI/HI/AVH. Patient expressed frustration as he did not want a full assessment, he just needed refills.   Patient is alert and oriented x 4. He is dressed appropriately. His speech is logical, eye contact is good, and thoughts are organized. His mood is irritated and his affect is appropriate. He has good insight, judgement, and impulse control. He does not appear to be responding to internal stimuli or experiencing delusional thought content.  Diagnosis: Schizoaffective disorder, bipolar type (per history)  Past Medical History:  Past Medical History:  Diagnosis Date  . ADHD (attention deficit hyperactivity disorder)   . Ankle fracture 01/2017   RIGHT ANKLE  . Anxiety   . Bipolar disorder (HCC)   . Depression   . 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 NO PAST SURGERIES    . ORIF ANKLE FRACTURE Right 01/31/2017   Procedure: OPEN REDUCTION INTERNAL FIXATION (ORIF) RIGHT TRIMALLEOLAR ANKLE FRACTURE;  Surgeon: [name], MD;  Location: [facility] OR;  Service: Orthopedics;  Laterality: Right;    Family History:  Family History  Problem Relation Age of Onset  . Cancer Father   . Diabetes Maternal Grandmother   . Alzheimer's disease Paternal Grandmother   . Heart failure Paternal Grandmother   . Heart failure Paternal Grandfather   . Heart failure Maternal Grandfather     Social History:  reports that he has been smoking cigarettes. He has a 10.00 pack-year smoking history. He has never used smokeless tobacco. He reports current alcohol use. He reports current drug use. Drugs: Marijuana and Cocaine.  Additional Social History:  Alcohol / Drug Use Pain Medications: see MAR Prescriptions: see MAR Over the Counter: see MAR History of alcohol / drug use?:  Yes Substance #1 Name of Substance 1: THC  CIWA:   COWS:    Allergies:  Allergies  Allergen Reactions  . Amoxicillin Swelling and Rash    "throat started to close up" Has patient had a PCN reaction causing immediate rash, facial/tongue/throat swelling, SOB or lightheadedness with hypotension: Yes Has patient had a PCN reaction causing severe rash involving mucus membranes or skin necrosis: Yes Has patient had a PCN reaction that required hospitalization No Has patient had a PCN reaction occurring within the last 10 years: No If all of the above answers are "NO", then may proceed with Cephalosporin use.   Marland Kitchen Penicillins Rash and Other (See Comments)    Has patient had a PCN reaction causing immediate rash, facial/tongue/throat swelling, SOB or lightheadedness with hypotension: unsure Has patient had a PCN reaction causing severe rash involving mucus membranes or skin necrosis: Unsure Has patient had a PCN reaction that required hospitalization Unsure Has patient had a PCN reaction occurring within the last 10 years: No If all of the above answers are "NO", then may proceed with Cephalosporin use.    Home Medications: (Not in a hospital admission)   OB/GYN Status:  No LMP for male patient.  General Assessment Data Location of Assessment: [facility] Assessment Services TTS Assessment: In system Is this a Tele or Face-to-Face Assessment?: Face-to-Face Is this an Initial Assessment or a Re-assessment for this encounter?: Initial Assessment Patient Accompanied by:: N/A Language Other than English: No Living Arrangements: (private residence) What gender do you identify as?: Male  Marital status: Single Pregnancy Status: No Living Arrangements: Alone Can pt return to current living arrangement?: Yes Admission Status: Voluntary Is patient capable of signing voluntary admission?: Yes Referral Source: Self/Family/Friend Insurance type: Medicaid  Medical Screening Exam [facility] Walk-in  ONLY) Medical Exam completed: Yes  Crisis Care Plan Living Arrangements: Alone Legal Guardian: (self) Name of Psychiatrist: [facility] Name of Therapist: none  Education Status Is patient currently in school?: No Is the patient employed, unemployed or receiving disability?: Employed  Risk to self with the past 6 months Suicidal Ideation: No Has patient been a risk to self within the past 6 months prior to admission? : No Suicidal Intent: No Has patient had any suicidal intent within the past 6 months prior to admission? : No Is patient at risk for suicide?: No Suicidal Plan?: No Has patient had any suicidal plan within the past 6 months prior to admission? : No Access to Means: No What has been your use of drugs/alcohol within the last 12 months?: THC use Previous Attempts/Gestures: No How many times?: 0 Other Self Harm Risks: denies Triggers for Past Attempts: None known Intentional Self Injurious Behavior: None Family Suicide History: No Recent stressful life event(s): (none) Persecutory voices/beliefs?: No Depression: No Substance abuse history and/or treatment for substance abuse?: No Suicide prevention information given to non-admitted patients: Not applicable  Risk to Others within the past 6 months Homicidal Ideation: No Does patient have any lifetime risk of violence toward others beyond the six months prior to admission? : No Thoughts of Harm to Others: No Current Homicidal Intent: No Current Homicidal Plan: No Access to Homicidal Means: No Identified Victim: denies History of harm to others?: No Assessment of Violence: None Noted Violent Behavior Description: denies Does patient have access to weapons?: No Criminal Charges Pending?: No Does patient have a court date: No Is patient on probation?: No  Psychosis Hallucinations: None noted Delusions: None noted  Mental Status Report Appearance/Hygiene: Unremarkable Eye Contact: Good Motor Activity: Freedom  of movement Speech: Logical/coherent Level of Consciousness: Alert Mood: Irritable Affect: Appropriate to circumstance Anxiety Level: None Thought Processes: Coherent, Relevant Judgement: Unimpaired Orientation: Person, Place, Time, Situation Obsessive Compulsive Thoughts/Behaviors: None  Cognitive Functioning Concentration: Normal Memory: Recent Intact, Remote Intact Is patient IDD: No Insight: Good Impulse Control: Good Appetite: Good Have you had any weight changes? : No Change Sleep: No Change Total Hours of Sleep: (8) Vegetative Symptoms: None  ADLScreening [facility] Assessment Services) Patient's cognitive ability adequate to safely complete daily activities?: Yes Patient able to express need for assistance with ADLs?: Yes Independently performs ADLs?: Yes (appropriate for developmental age)  Prior Inpatient Therapy Prior Inpatient Therapy: Yes Prior Therapy Dates: 2018 Prior Therapy Facilty/Provider(s): [facility] Reason for Treatment: schizoaffective  Prior Outpatient Therapy Prior Outpatient Therapy: Yes Prior Therapy Dates: ongoing Prior Therapy Facilty/Provider(s): [facility] Reason for Treatment: med management Does patient have an ACCT team?: No Does patient have Intensive In-House Services?  : No Does patient have [facility] services? : No Does patient have P4CC services?: No  ADL Screening (condition at time of admission) Patient's cognitive ability adequate to safely complete daily activities?: Yes Is the patient deaf or have difficulty hearing?: No Does the patient have difficulty seeing, even when wearing glasses/contacts?: No Does the patient have difficulty concentrating, remembering, or making decisions?: No Patient able to express need for assistance with ADLs?: Yes Does the patient have difficulty dressing or bathing?: No Independently performs ADLs?: Yes (appropriate for developmental age) Does the patient have difficulty walking or  climbing stairs?:  No Weakness of Legs: None Weakness of Arms/Hands: None  Home Assistive Devices/Equipment Home Assistive Devices/Equipment: None  Therapy Consults (therapy consults require a physician order) PT Evaluation Needed: No OT Evalulation Needed: No SLP Evaluation Needed: No Abuse/Neglect Assessment (Assessment to be complete while patient is alone) Abuse/Neglect Assessment Can Be Completed: Yes Physical Abuse: Denies Verbal Abuse: Denies Sexual Abuse: Denies Exploitation of patient/patient's resources: Denies Self-Neglect: Denies Values / Beliefs Cultural Requests During Hospitalization: None Spiritual Requests During Hospitalization: None Consults Spiritual Care Consult Needed: No Transition of Care Team Consult Needed: No Advance Directives (For Healthcare) Does Patient Have a Medical Advance Directive?: No Would patient like information on creating a medical advance directive?: No - Patient declined          Disposition: Per Letitia Libra, FNP patient does not meet in patient criteria and is psych cleared. Hall Busing, FNP provided prescriptions and gave patient information on [facility] for future refills. Disposition Initial Assessment Completed for this Encounter: Yes Disposition of Patient: Discharge Patient refused recommended treatment: No  On Site Evaluation by:   Reviewed with Physician:    Orvis Brill 04/09/2020 11:26 AM

## 2020-04-09 NOTE — H&P (Signed)
Behavioral Health Medical Screening Exam  George [name] is an 29 y.o. male.  Patient presents to [facility] behavioral health, voluntarily, for walk-in assessment.  Patient assessed by nurse practitioner. Patient states he needs to go to "smoke a cigarette."  Patient alert and oriented, answers appropriately.  Patient assessed briefly as he insisted he must leave. Patient denies suicidal and homicidal ideations.  Patient denies auditory visual hallucinations.  Patient denies symptoms of paranoia. Patient reports "I only have about 5 days worth of my medications left and I was just coming back to get my medications, I will follow up with outpatient."   Total Time spent with patient: 15 minutes  Psychiatric Specialty Exam: Physical Exam  Nursing note and vitals reviewed. Constitutional: He is oriented to person, place, and time. He appears well-developed.  HENT:  Head: Normocephalic.  Cardiovascular: Normal rate.  Respiratory: Effort normal.  Neurological: He is alert and oriented to person, place, and time.  Psychiatric: He has a normal mood and affect. His speech is normal and behavior is normal. Judgment and thought content normal. Cognition and memory are normal.   Review of Systems  Constitutional: Negative.   HENT: Negative.   Eyes: Negative.   Respiratory: Negative.   Cardiovascular: Negative.   Gastrointestinal: Negative.   Genitourinary: Negative.   Musculoskeletal: Negative.   Skin: Negative.   Neurological: Negative.   Psychiatric/Behavioral: Negative.    There were no vitals taken for this visit.There is no height or weight on file to calculate BMI. General Appearance: Casual and Fairly Groomed Eye Contact:  Good Speech:  Clear and Coherent and Normal Rate Volume:  Normal Mood:  Euthymic Affect:  Appropriate and Congruent Thought Process:  Coherent, Goal Directed and Descriptions of Associations: Intact Orientation:  Full (Time, Place, and Person) Thought Content:   WDL and Logical Suicidal Thoughts:  No Homicidal Thoughts:  No Memory:  Immediate;   Good Recent;   Good Remote;   Good Judgement:  Good Insight:  Good Psychomotor Activity:  Normal Concentration: Concentration: Good Recall:  Fair Fund of Knowledge:Good Language: Good Akathisia:  No Handed:  Right AIMS (if indicated):    Assets:  Communication Skills Desire for Improvement Financial Resources/Insurance Housing Intimacy Leisure Time Physical Health Resilience Social Support Talents/Skills Sleep:     Musculoskeletal: Strength & Muscle Tone: within normal limits Gait & Station: normal Patient leans: N/A  There were no vitals taken for this visit.  Recommendations: Patient discussed with Dr. Lucianne Muss. Follow-up with outpatient psychiatry.  Based on my evaluation the patient does not appear to have an emergency medical condition.  Patrcia Dolly, FNP 04/09/2020, 3:53 PM

## 2020-04-12 ENCOUNTER — Encounter (HOSPITAL_COMMUNITY): Payer: Self-pay | Admitting: Clinical

## 2020-04-12 ENCOUNTER — Encounter (HOSPITAL_COMMUNITY): Payer: Self-pay | Admitting: Psychiatry

## 2020-04-12 ENCOUNTER — Other Ambulatory Visit: Payer: Self-pay

## 2020-04-12 ENCOUNTER — Ambulatory Visit (INDEPENDENT_AMBULATORY_CARE_PROVIDER_SITE_OTHER): Payer: No Payment, Other | Admitting: Psychiatry

## 2020-04-12 ENCOUNTER — Ambulatory Visit (INDEPENDENT_AMBULATORY_CARE_PROVIDER_SITE_OTHER): Payer: No Payment, Other | Admitting: Clinical

## 2020-04-12 DIAGNOSIS — F419 Anxiety disorder, unspecified: Secondary | ICD-10-CM | POA: Diagnosis not present

## 2020-04-12 DIAGNOSIS — F3178 Bipolar disorder, in full remission, most recent episode mixed: Secondary | ICD-10-CM

## 2020-04-12 MED ORDER — QUETIAPINE FUMARATE 200 MG PO TABS: 200.0000 mg | ORAL_TABLET | Freq: Every day | ORAL | 1 refills | Status: DC

## 2020-04-12 MED ORDER — OXCARBAZEPINE 600 MG PO TABS: 600.0000 mg | ORAL_TABLET | Freq: Two times a day (BID) | ORAL | 1 refills | Status: DC

## 2020-04-12 MED ORDER — BENZTROPINE MESYLATE 0.5 MG PO TABS
0.5000 mg | ORAL_TABLET | Freq: Every day | ORAL | 1 refills | Status: DC
Start: 2020-04-12 — End: 2020-06-16

## 2020-04-12 MED ORDER — BENZTROPINE MESYLATE 0.5 MG PO TABS: 0.5000 mg | ORAL_TABLET | Freq: Every day | ORAL | 1 refills | Status: DC

## 2020-04-12 NOTE — Progress Notes (Signed)
BH MD/PA/NP OP Progress Note  04/12/2020 1:58 PM George [name]  MRN:  [id number]  Chief Complaint: " I am doing fine on my medications."  HPI: This is a 29 year old male with history of bipolar disorder now seen for continuity of care after his care was transferred to our clinic from [place].  Patient reported that he has a long history of mood swings that started around the age of 45 or 41.  He stated that he has periods of time when he has a lot of energy, racing thoughts, irritability, agitation, impulsivity and pressured speech.  He also reported having periods of time when he has no energy, no interest in doing anything, poor concentration and poor sleep. He reported that he can have both mania and depression symptoms on the same day. He reported that he has been on several different medications including Abilify, Prozac, Zoloft.  He also reported being to the emergency room for similar symptoms several times. He has also been hospitalized to inpatient psychiatry unit at [facility] behavioral health in the past, last admission was back in 2017. He reported that his current medication combination of Trileptal 1200 mg at bedtime, Seroquel 200 mg at bedtime, Cogentin 0.5 mg at bedtime seems to be the best combination.  He stated that it keeps his mood level.  He can function during the day without feeling drowsy.  He stated that he does not have any unnecessary side effects which he is happy about.  He feels that his anxiety can be better controlled however hydroxyzine makes him very drowsy and therefore he does not want to take anything additionally. He reported that he would like to continue the same regimen. He works at [employer] from 1 PM to [employer] PM and sometimes also works for [employer] after getting off work until 1 AM.  He stated that he takes his medications as soon as he goes home and then he is able to sleep for a few hours and he feels well rested.  He generally wakes up around 9 or 10 AM in the  morning.  He denied any symptom suggestive of psychosis.  He denied any active manic or depressive symptoms.  He denied any suicidal or homicidal ideations.  He also requested to be connected to a therapist as he would like to see someone for counseling.   Visit Diagnosis:    ICD-10-CM   1. Bipolar 1 disorder, mixed, full remission (HCC)  F31.78   2. Anxiety  F41.9     Past Psychiatric History: Bipolar 1 disorder, history of several emergency room visits as well as psychiatry hospitalizations in the past.  Past Medical History:  Past Medical History:  Diagnosis Date  . ADHD (attention deficit hyperactivity disorder)   . Ankle fracture 01/2017   RIGHT ANKLE  . Anxiety   . Bipolar disorder (HCC)   . Depression   . 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 NO PAST SURGERIES    . ORIF ANKLE FRACTURE Right 01/31/2017   Procedure: OPEN REDUCTION INTERNAL FIXATION (ORIF) RIGHT TRIMALLEOLAR ANKLE FRACTURE;  Surgeon: [name], MD;  Location: [facility] OR;  Service: Orthopedics;  Laterality: Right;    Family Psychiatric History: see below  Family History:  Family History  Problem Relation Age of Onset  . Cancer Father   . Diabetes Maternal Grandmother   . Alzheimer's disease Paternal Grandmother   . Heart failure Paternal Grandmother   . Heart failure Paternal  Grandfather   . Heart failure Maternal Grandfather     Social History:  Social History   Socioeconomic History  . Marital status: Single    Spouse name: Not on file  . Number of children: Not on file  . Years of education: Not on file  . Highest education level: Not on file  Occupational History  . Not on file  Tobacco Use  . Smoking status: Current Every Day Smoker    Packs/day: 1.00    Years: 10.00    Pack years: 10.00    Types: Cigarettes  . Smokeless tobacco: Never Used  . Tobacco comment: off and on  Substance and Sexual Activity  . Alcohol use: Yes  . Drug use:  Yes    Types: Marijuana, Cocaine    Comment: heroin  . Sexual activity: Not on file    Comment: Unknown  Other Topics Concern  . Not on file  Social History Narrative  . Not on file   Social Determinants of Health   Financial Resource Strain:   . Difficulty of Paying Living Expenses:   Food Insecurity:   . Worried About Charity fundraiser in the Last Year:   . Arboriculturist in the Last Year:   Transportation Needs:   . Film/video editor (Medical):   Marland Kitchen Lack of Transportation (Non-Medical):   Physical Activity:   . Days of Exercise per Week:   . Minutes of Exercise per Session:   Stress:   . Feeling of Stress :   Social Connections:   . Frequency of Communication with Friends and Family:   . Frequency of Social Gatherings with Friends and Family:   . Attends Religious Services:   . Active Member of Clubs or Organizations:   . Attends Archivist Meetings:   Marland Kitchen Marital Status:     Allergies:  Allergies  Allergen Reactions  . Amoxicillin Swelling and Rash    "throat started to close up" Has patient had a PCN reaction causing immediate rash, facial/tongue/throat swelling, SOB or lightheadedness with hypotension: Yes Has patient had a PCN reaction causing severe rash involving mucus membranes or skin necrosis: Yes Has patient had a PCN reaction that required hospitalization No Has patient had a PCN reaction occurring within the last 10 years: No If all of the above answers are "NO", then may proceed with Cephalosporin use.   Marland Kitchen Penicillins Rash and Other (See Comments)    Has patient had a PCN reaction causing immediate rash, facial/tongue/throat swelling, SOB or lightheadedness with hypotension: unsure Has patient had a PCN reaction causing severe rash involving mucus membranes or skin necrosis: Unsure Has patient had a PCN reaction that required hospitalization Unsure Has patient had a PCN reaction occurring within the last 10 years: No If all of the above  answers are "NO", then may proceed with Cephalosporin use.    Metabolic Disorder Labs: Lab Results  Component Value Date   HGBA1C 6.2 (H) 12/10/2015   MPG 131 12/10/2015   Lab Results  Component Value Date   PROLACTIN 28.2 (H) 12/10/2015   Lab Results  Component Value Date   CHOL 119 12/10/2015   TRIG 91 12/10/2015   HDL 34 (L) 12/10/2015   CHOLHDL 3.5 12/10/2015   VLDL 18 12/10/2015   LDLCALC 67 12/10/2015   Lab Results  Component Value Date   TSH 1.744 12/10/2015    Therapeutic Level Labs: No results found for: LITHIUM No results found for: VALPROATE No components  found for:  CBMZ  Current Medications: Current Outpatient Medications  Medication Sig Dispense Refill  . aspirin EC 325 MG tablet Take 1 tablet (325 mg total) by mouth 2 (two) times daily. 84 tablet 0  . benztropine (COGENTIN) 0.5 MG tablet Take 0.5 mg by mouth 2 (two) times daily.    . hydrOXYzine (ATARAX/VISTARIL) 25 MG tablet Take 1 tablet (25 mg total) by mouth 3 (three) times daily as needed for anxiety. (Patient taking differently: Take 25 mg by mouth 3 (three) times daily. ) 30 tablet 0  . OXcarbazepine (TRILEPTAL) 150 MG tablet Take 1 tablet (150 mg total) by mouth 2 (two) times daily. 60 tablet 0  . QUEtiapine (SEROQUEL) 200 MG tablet Take 1 tablet (200 mg total) by mouth at bedtime. 30 tablet 0   No current facility-administered medications for this visit.     Musculoskeletal: Strength & Muscle Tone: within normal limits Gait & Station: normal Patient leans: N/A  Psychiatric Specialty Exam: Review of Systems  There were no vitals taken for this visit.There is no height or weight on file to calculate BMI.  General Appearance: Fairly Groomed  Eye Contact:  Good  Speech:  Clear and Coherent and Normal Rate  Volume:  Normal  Mood:  Euthymic  Affect:  Congruent  Thought Process:  Goal Directed and Descriptions of Associations: Intact  Orientation:  Full (Time, Place, and Person)  Thought  Content: Logical   Suicidal Thoughts:  No  Homicidal Thoughts:  No  Memory:  Immediate;   Good Recent;   Good  Judgement:  Fair  Insight:  Fair  Psychomotor Activity:  Normal  Concentration:  Concentration: Good and Attention Span: Good  Recall:  Good  Fund of Knowledge: Good  Language: Good  Akathisia:  Negative  Handed:  Right  AIMS (if indicated): not done  Assets:  Communication Skills Desire for Improvement Financial Resources/Insurance Housing Social Support Transportation Vocational/Educational  ADL's:  Intact  Cognition: WNL  Sleep:  Good   Screenings: AIMS     [facility]  AIMS Total Score  0    AUDIT     [facility]  Alcohol Use Disorder Identification Test Final Score (AUDIT)  0       Assessment and Plan: 29 year old male with history of bipolar 1 disorder with prior history of psychiatric admissions and several emergency room visits now seen for establishing care at this clinic.  Patient appears to be stable on his current regimen of medications.  1. Bipolar 1 disorder, mixed, full remission (HCC)  - benztropine (COGENTIN) 0.5 MG tablet; Take 1 tablet (0.5 mg total) by mouth at bedtime.  Dispense: 30 tablet; Refill: 1 - oxcarbazepine (TRILEPTAL) 600 MG tablet; Take 1 tablet (600 mg total) by mouth 2 (two) times daily.  Dispense: 60 tablet; Refill: 1 - QUEtiapine (SEROQUEL) 200 MG tablet; Take 1 tablet (200 mg total) by mouth at bedtime.  Dispense: 30 tablet; Refill: 1  2. Anxiety  Continue same regimen. Patient was connected with a therapist for intake assessment later today. Follow-up in 2 months    Zena Amos, MD 04/12/2020, 1:58 PM

## 2020-04-12 NOTE — Progress Notes (Signed)
   THERAPIST PROGRESS NOTE  Session Time: 53 minutes  Participation Level: Active  Behavioral Response: CasualAlertCooperative  Type of Therapy: Individual Therapy  Treatment Goals addressed: Coping  Interventions: CBT and Supportive  Summary: George [name] is a 29 y.o. male who presents with a treatment history of bipolar disorder. Client requested therapy during his recent medication check. Client reported he is currently doing well working and he enjoys his job. Client discussed his current stressors from friendship, trusting others, and developing healthy coping skills and maintaining a healthy mindset. Client discussed his lack of positive support stating, "sometimes I wish I could go back to when life was hard". Client dicussed having more support when he engaged in unhealthy behaviors such as drinking and partying than when he decided to make better choices. Client discussed the conflict of viewing difficult situations stating, "when you move on you think the problems are gone but they're not".   Suicidal/Homicidal: Nowithout intent/plan  Therapist Response: Therapist discussed with the client past coping skills used and current skills used now. Client reported he lives along and tends to over think things. Client reported using a form of manipulation to have people do things for him because he doesn't believe others have genuinely good intentions for him at times. Therapist assigned the client homework to write down his motivation for beginning therapy and specific goals and reason he wants to work on them.  Plan: Return again in 2 weeks.  Diagnosis: Bipolar 1 disorder, mixed, full remission    Neena Rhymes Bill Mcvey, LCSW 04/14/2020

## 2020-04-12 NOTE — Addendum Note (Signed)
Addended by: Zena Amos on: 04/12/2020 03:16 PM   Modules accepted: Orders

## 2020-04-28 ENCOUNTER — Ambulatory Visit (INDEPENDENT_AMBULATORY_CARE_PROVIDER_SITE_OTHER): Payer: No Payment, Other | Admitting: Clinical

## 2020-04-28 ENCOUNTER — Other Ambulatory Visit: Payer: Self-pay

## 2020-04-28 DIAGNOSIS — F3178 Bipolar disorder, in full remission, most recent episode mixed: Secondary | ICD-10-CM
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
## 2020-05-19 ENCOUNTER — Ambulatory Visit (INDEPENDENT_AMBULATORY_CARE_PROVIDER_SITE_OTHER): Payer: No Payment, Other | Admitting: Clinical

## 2020-05-19 ENCOUNTER — Other Ambulatory Visit: Payer: Self-pay

## 2020-05-19 DIAGNOSIS — F3178 Bipolar disorder, in full remission, most recent episode mixed: Secondary | ICD-10-CM | POA: Diagnosis not present
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
## 2020-06-09 ENCOUNTER — Encounter (HOSPITAL_COMMUNITY): Payer: Federal, State, Local not specified - Other | Admitting: Psychiatry

## 2020-06-14 ENCOUNTER — Encounter (HOSPITAL_COMMUNITY): Payer: Self-pay | Admitting: Psychiatry

## 2020-06-16 ENCOUNTER — Telehealth (HOSPITAL_COMMUNITY): Payer: Self-pay | Admitting: Psychiatry

## 2020-06-16 ENCOUNTER — Ambulatory Visit (HOSPITAL_COMMUNITY): Payer: No Payment, Other | Admitting: Clinical

## 2020-06-16 ENCOUNTER — Encounter (HOSPITAL_COMMUNITY): Payer: No Payment, Other | Admitting: Psychiatry

## 2020-06-16 DIAGNOSIS — F3178 Bipolar disorder, in full remission, most recent episode mixed: Secondary | ICD-10-CM

## 2020-06-16 MED ORDER — QUETIAPINE FUMARATE 200 MG PO TABS: 200.0000 mg | ORAL_TABLET | Freq: Every day | ORAL | 1 refills | Status: DC

## 2020-06-16 MED ORDER — OXCARBAZEPINE 600 MG PO TABS: 600.0000 mg | ORAL_TABLET | Freq: Two times a day (BID) | ORAL | 1 refills | Status: DC

## 2020-06-16 MED ORDER — BENZTROPINE MESYLATE 0.5 MG PO TABS: 0.5000 mg | ORAL_TABLET | Freq: Every day | ORAL | 1 refills | Status: DC

## 2020-06-16 NOTE — Telephone Encounter (Signed)
All right, prescriptions sent until his next appointment.

## 2020-08-04 ENCOUNTER — Other Ambulatory Visit: Payer: Self-pay

## 2020-08-04 ENCOUNTER — Ambulatory Visit (INDEPENDENT_AMBULATORY_CARE_PROVIDER_SITE_OTHER): Payer: No Payment, Other | Admitting: Psychiatry

## 2020-08-04 ENCOUNTER — Encounter (HOSPITAL_COMMUNITY): Payer: Self-pay | Admitting: Psychiatry

## 2020-08-04 DIAGNOSIS — F3178 Bipolar disorder, in full remission, most recent episode mixed: Secondary | ICD-10-CM

## 2020-08-04 MED ORDER — QUETIAPINE FUMARATE 200 MG PO TABS: 200.0000 mg | ORAL_TABLET | Freq: Every day | ORAL | 2 refills | Status: DC

## 2020-08-04 MED ORDER — BENZTROPINE MESYLATE 0.5 MG PO TABS: 0.5000 mg | ORAL_TABLET | Freq: Every day | ORAL | 2 refills | Status: DC

## 2020-08-04 MED ORDER — OXCARBAZEPINE 600 MG PO TABS: 600.0000 mg | ORAL_TABLET | Freq: Two times a day (BID) | ORAL | 2 refills | Status: DC

## 2020-08-04 NOTE — Progress Notes (Signed)
[facility] MD/PA/NP OP Progress Note  08/04/2020 2:34 PM George [name]  MRN:  [id number]  Chief Complaint: " Everything is going well."  HPI: Pt reported he is doing well.  He is still working at [employer] and everything is going well there.  He stated his mood has been stable.  He is able to sleep well with the help of the medication.  He stated that he cannot sleep without it so he knows he has to take it every night.  Overall everything is going well for him and he would like to keep things the way they are.   Visit Diagnosis:    ICD-10-CM   1. Bipolar 1 disorder, mixed, full remission (HCC)  F31.78 benztropine (COGENTIN) 0.5 MG tablet    oxcarbazepine (TRILEPTAL) 600 MG tablet    QUEtiapine (SEROQUEL) 200 MG tablet    Past Psychiatric History: Bipolar 1 disorder, history of several emergency room visits as well as psychiatry hospitalizations in the past.  Past Medical History:  Past Medical History:  Diagnosis Date  . ADHD (attention deficit hyperactivity disorder)   . Ankle fracture 01/2017   RIGHT ANKLE  . Anxiety   . Bipolar disorder (HCC)   . Depression   . 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 NO PAST SURGERIES    . ORIF ANKLE FRACTURE Right 01/31/2017   Procedure: OPEN REDUCTION INTERNAL FIXATION (ORIF) RIGHT TRIMALLEOLAR ANKLE FRACTURE;  Surgeon: [name], MD;  Location: [facility] OR;  Service: Orthopedics;  Laterality: Right;    Family Psychiatric History: see below  Family History:  Family History  Problem Relation Age of Onset  . Cancer Father   . Diabetes Maternal Grandmother   . Alzheimer's disease Paternal Grandmother   . Heart failure Paternal Grandmother   . Heart failure Paternal Grandfather   . Heart failure Maternal Grandfather     Social History:  Social History   Socioeconomic History  . Marital status: Single    Spouse name: Not on file  . Number of children: Not on file  . Years of education: Not on file   . Highest education level: Not on file  Occupational History  . Not on file  Tobacco Use  . Smoking status: Current Every Day Smoker    Packs/day: 1.00    Years: 10.00    Pack years: 10.00    Types: Cigarettes  . Smokeless tobacco: Never Used  . Tobacco comment: off and on  Substance and Sexual Activity  . Alcohol use: Yes  . Drug use: Yes    Types: Marijuana, Cocaine    Comment: heroin  . Sexual activity: Not on file    Comment: Unknown  Other Topics Concern  . Not on file  Social History Narrative  . Not on file   Social Determinants of Health   Financial Resource Strain:   . Difficulty of Paying Living Expenses: Not on file  Food Insecurity:   . Worried About Programme researcher, broadcasting/film/video in the Last Year: Not on file  . Ran Out of Food in the Last Year: Not on file  Transportation Needs:   . Lack of Transportation (Medical): Not on file  . Lack of Transportation (Non-Medical): Not on file  Physical Activity:   . Days of Exercise per Week: Not on file  . Minutes of Exercise per Session: Not on file  Stress:   . Feeling of Stress : Not on file  Social Connections:   .  Frequency of Communication with Friends and Family: Not on file  . Frequency of Social Gatherings with Friends and Family: Not on file  . Attends Religious Services: Not on file  . Active Member of Clubs or Organizations: Not on file  . Attends Banker Meetings: Not on file  . Marital Status: Not on file    Allergies:  Allergies  Allergen Reactions  . Amoxicillin Swelling and Rash    "throat started to close up" Has patient had a PCN reaction causing immediate rash, facial/tongue/throat swelling, SOB or lightheadedness with hypotension: Yes Has patient had a PCN reaction causing severe rash involving mucus membranes or skin necrosis: Yes Has patient had a PCN reaction that required hospitalization No Has patient had a PCN reaction occurring within the last 10 years: No If all of the above  answers are "NO", then may proceed with Cephalosporin use.   Marland Kitchen Penicillins Rash and Other (See Comments)    Has patient had a PCN reaction causing immediate rash, facial/tongue/throat swelling, SOB or lightheadedness with hypotension: unsure Has patient had a PCN reaction causing severe rash involving mucus membranes or skin necrosis: Unsure Has patient had a PCN reaction that required hospitalization Unsure Has patient had a PCN reaction occurring within the last 10 years: No If all of the above answers are "NO", then may proceed with Cephalosporin use.    Metabolic Disorder Labs: Lab Results  Component Value Date   HGBA1C 6.2 (H) 12/10/2015   MPG 131 12/10/2015   Lab Results  Component Value Date   PROLACTIN 28.2 (H) 12/10/2015   Lab Results  Component Value Date   CHOL 119 12/10/2015   TRIG 91 12/10/2015   HDL 34 (L) 12/10/2015   CHOLHDL 3.5 12/10/2015   VLDL 18 12/10/2015   LDLCALC 67 12/10/2015   Lab Results  Component Value Date   TSH 1.744 12/10/2015    Therapeutic Level Labs: No results found for: LITHIUM No results found for: VALPROATE No components found for:  CBMZ  Current Medications: Current Outpatient Medications  Medication Sig Dispense Refill  . aspirin EC 325 MG tablet Take 1 tablet (325 mg total) by mouth 2 (two) times daily. 84 tablet 0  . benztropine (COGENTIN) 0.5 MG tablet Take 1 tablet (0.5 mg total) by mouth at bedtime. 30 tablet 2  . oxcarbazepine (TRILEPTAL) 600 MG tablet Take 1 tablet (600 mg total) by mouth 2 (two) times daily. 60 tablet 2  . QUEtiapine (SEROQUEL) 200 MG tablet Take 1 tablet (200 mg total) by mouth at bedtime. 30 tablet 2   No current facility-administered medications for this visit.     Musculoskeletal: Strength & Muscle Tone: within normal limits Gait & Station: normal Patient leans: N/A  Psychiatric Specialty Exam: Review of Systems    There were no vitals taken for this visit.There is no height or weight on  file to calculate BMI.  General Appearance: Fairly Groomed  Eye Contact:  Good  Speech:  Clear and Coherent and Normal Rate  Volume:  Normal  Mood:  Euthymic  Affect:  Congruent  Thought Process:  Goal Directed and Descriptions of Associations: Intact  Orientation:  Full (Time, Place, and Person)  Thought Content: Logical   Suicidal Thoughts:  No  Homicidal Thoughts:  No  Memory:  Immediate;   Good Recent;   Good  Judgement:  Fair  Insight:  Fair  Psychomotor Activity:  Normal  Concentration:  Concentration: Good and Attention Span: Good  Recall:  Good  Fund of Knowledge: Good  Language: Good  Akathisia:  Negative  Handed:  Right  AIMS (if indicated): not done  Assets:  Communication Skills Desire for Improvement Financial Resources/Insurance Housing Social Support Transportation Vocational/Educational  ADL's:  Intact  Cognition: WNL  Sleep:  Good   Screenings: AIMS     [facility]  AIMS Total Score 0    AUDIT     [facility]  Alcohol Use Disorder Identification Test Final Score (AUDIT) 0       Assessment and Plan: Patient is stable on his current regimen.  1. Bipolar 1 disorder, mixed, full remission (HCC)  - benztropine (COGENTIN) 0.5 MG tablet; Take 1 tablet (0.5 mg total) by mouth at bedtime.  Dispense: 30 tablet; Refill: 2 - oxcarbazepine (TRILEPTAL) 600 MG tablet; Take 1 tablet (600 mg total) by mouth 2 (two) times daily.  Dispense: 60 tablet; Refill: 2 - QUEtiapine (SEROQUEL) 200 MG tablet; Take 1 tablet (200 mg total) by mouth at bedtime.  Dispense: 30 tablet; Refill: 2  Continue same regimen. Continue individual therapy. Follow-up in 3 months    Zena Amos, MD 08/04/2020, 2:34 PM

## 2020-08-10 ENCOUNTER — Other Ambulatory Visit: Payer: Self-pay

## 2020-08-10 ENCOUNTER — Ambulatory Visit (INDEPENDENT_AMBULATORY_CARE_PROVIDER_SITE_OTHER): Payer: No Payment, Other | Admitting: Clinical

## 2020-08-10 DIAGNOSIS — F3178 Bipolar disorder, in full remission, most recent episode mixed: Secondary | ICD-10-CM
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
## 2020-09-07 ENCOUNTER — Other Ambulatory Visit: Payer: Self-pay

## 2020-09-07 ENCOUNTER — Ambulatory Visit (INDEPENDENT_AMBULATORY_CARE_PROVIDER_SITE_OTHER): Payer: No Payment, Other | Admitting: Clinical

## 2020-09-07 DIAGNOSIS — F3178 Bipolar disorder, in full remission, most recent episode mixed: Secondary | ICD-10-CM

## 2020-09-07 NOTE — Progress Notes (Signed)
   THERAPIST PROGRESS NOTE Virtual Visit via Video Note  I connected with George [name] on 09/07/20 at  8:00 AM EDT by a video enabled telemedicine application and verified that I am speaking with the correct person using two identifiers.  Location: Patient: Home Provider: Office   I discussed the limitations of evaluation and management by telemedicine and the availability of in person appointments. The patient expressed understanding and agreed to proceed.   Follow Up Instructions: I discussed the assessment and treatment plan with the patient. The patient was provided an opportunity to ask questions and all were answered. The patient agreed with the plan and demonstrated an understanding of the instructions.   The patient was advised to call back or seek an in-person evaluation if the symptoms worsen or if the condition fails to improve as anticipated.    Session Time: 45 minnutes  Participation Level: Active  Behavioral Response: CasualAlertIrritable  Type of Therapy: Individual Therapy  Treatment Goals addressed: Diagnosis: Impulse Control  Interventions: CBT  Summary:  George [name] is a 29 y.o. male who presents for the scheduled appointment oriented times five, appropriately dressed, and friendly. Client denied hallucinations and delusions. Client reported on today having some unresolved feelings of irritability. Client reported since the last session he did not get a promotion at work that he applied for. Client reported he felt offended for being passed on the opportunity because he feels he was more qualified than the other employee. 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 Client stated, "I got madder than I thought". Client discussed with the therapist in the past disclosing to others about his bipolar  diagnosis whether it was to friends, family or coworkers to explain why his behavior gets the way it does. Client reported he feels like " normal" people can talk about when they are upset about things but he cannot because people know about his diagnosis and label him as "crazy". Client reported he tries to talk about what bothers him to his family or friends at times but he gets asked " have you taken your medication". Client reported it makes him feel like he cannot express himself confidently without judgement. Client acknowledged "my attitude has ruined opportunities for me at work, with family, friends, and in relationships". Client engaged with the therapist in understanding the link between the acknowledgment of his behavior compared to what he tries to accomplish currently. Client engaged with the therapist in discussion about brainstorming his boundaries and reframing his thoughts to help control his mood.     Suicidal/Homicidal: Nowithout intent/plan  Therapist Response:  Therapy began the session by checking in and asking the client how he has been doing. Therapist actively listened and used empathy in response to the clients thoughts and feelings. Therapist engaged the client by asking open ended questions about how he responded to the triggers of the idenified situation. Therapist used CBT to discuss boundaries and impulse control. Therapist assigned the client homework to stop and think before reacting to triggers by contemplating consequences. Therapist assisted with scheduling next appointments.    Plan: Return again in 3 weeks for individual therapy.  Diagnosis: Bipolar 1 disorder, mixed, full remission    George Rhymes Elison Worrel, LCSW 09/07/2020

## 2020-10-05 ENCOUNTER — Ambulatory Visit (INDEPENDENT_AMBULATORY_CARE_PROVIDER_SITE_OTHER): Payer: No Payment, Other | Admitting: Clinical

## 2020-10-05 ENCOUNTER — Other Ambulatory Visit: Payer: Self-pay

## 2020-10-05 DIAGNOSIS — F3178 Bipolar disorder, in full remission, most recent episode mixed: Secondary | ICD-10-CM
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
## 2020-11-03 ENCOUNTER — Ambulatory Visit (HOSPITAL_COMMUNITY): Payer: No Payment, Other | Admitting: Psychiatry

## 2020-11-06 ENCOUNTER — Other Ambulatory Visit: Payer: Self-pay

## 2020-11-06 ENCOUNTER — Encounter (HOSPITAL_COMMUNITY): Payer: Self-pay | Admitting: Emergency Medicine

## 2020-11-06 ENCOUNTER — Ambulatory Visit (HOSPITAL_COMMUNITY)
Admission: EM | Admit: 2020-11-06 | Discharge: 2020-11-06 | Disposition: A | Discharge status: Home / Self Care | Payer: Self-pay | Attending: Family Medicine | Admitting: Family Medicine

## 2020-11-06 DIAGNOSIS — R112 Nausea with vomiting, unspecified: Secondary | ICD-10-CM | POA: Insufficient documentation

## 2020-11-06 DIAGNOSIS — J029 Acute pharyngitis, unspecified: Secondary | ICD-10-CM | POA: Insufficient documentation

## 2020-11-06 LAB — POCT RAPID STREP A, ED / UC: Streptococcus, Group A Screen (Direct): NEGATIVE

## 2020-11-06 NOTE — ED Triage Notes (Signed)
Here for sore throat and vomiting onset last night.  Reports he was drinking liquor and started to vomit  Denies f/v/n/d  Needing note for work.   A&O x4... NAD.... ambulaotry

## 2020-11-06 NOTE — Discharge Instructions (Signed)
You may use over the counter ibuprofen or acetaminophen as needed.  For a sore throat, over the counter products such as [employer] Peroxyl Mouth Sore Rinse or Chloraseptic Sore Throat Spray may provide some temporary relief. Your rapid strep test was negative today. We have sent your throat swab for culture and will let you know of any positive results. 

## 2020-11-08 LAB — CULTURE, GROUP A STREP (THRC)

## 2020-11-08 NOTE — ED Provider Notes (Signed)
[facility]   [id number] 11/06/20 Arrival Time: 1231  ASSESSMENT & PLAN:  1. Non-intractable vomiting with nausea, unspecified vomiting type   2. Sore throat     Rapid strep negative. Culture sent. Declines COVID testing. OTC symptom care as needed.    Follow-up Information    [facility] EMERGENCY DEPARTMENT.   Specialty: Emergency Medicine Why: If symptoms worsen in any way. Contact information: [address] [phone] mc [place] [address] [phone]              Reviewed expectations re: course of current medical issues. Questions answered. Outlined signs and symptoms indicating need for more acute intervention. Understanding verbalized. After Visit Summary given.   SUBJECTIVE: History from: patient. George [name] is a 30 y.o. male who reports ST after vomiting s/p significant liquor intake. Feeling better now. Throat still a little sore. No neck swelling. Denies: fever. Normal PO intake without n/v/d.    OBJECTIVE:  Vitals:   11/06/20 1426  BP: 139/83  Pulse: 62  Resp: 14  Temp: 98.8 F (37.1 C)  TempSrc: Oral  SpO2: 97%    General appearance: alert; no distress Eyes: PERRLA; EOMI; conjunctiva normal HENT: Blunt; AT; without nasal congestion; throat with mild irriation Neck: supple  Lungs: speaks full sentences without difficulty; unlabored Extremities: no edema Skin: warm and dry Neurologic: normal gait Psychological: alert and cooperative; normal mood and affect  Labs: Results for orders placed or performed during the hospital encounter of 11/06/20  Culture, group A strep (throat)   Specimen: Throat  Result Value Ref Range   Specimen Description THROAT    Special Requests NONE    Culture      TOO YOUNG TO READ Performed at [facility], [address], [place], [place] [address]    Report Status PENDING   POCT Rapid Strep A  Result Value Ref Range   Streptococcus, Group A  Screen (Direct) NEGATIVE NEGATIVE   Labs Reviewed  CULTURE, GROUP A STREP [facility])  POCT RAPID STREP A, ED / UC     Allergies  Allergen Reactions  . Amoxicillin Swelling and Rash    "throat started to close up" Has patient had a PCN reaction causing immediate rash, facial/tongue/throat swelling, SOB or lightheadedness with hypotension: Yes Has patient had a PCN reaction causing severe rash involving mucus membranes or skin necrosis: Yes Has patient had a PCN reaction that required hospitalization No Has patient had a PCN reaction occurring within the last 10 years: No If all of the above answers are "NO", then may proceed with Cephalosporin use.   Marland Kitchen Penicillins Rash and Other (See Comments)    Has patient had a PCN reaction causing immediate rash, facial/tongue/throat swelling, SOB or lightheadedness with hypotension: unsure Has patient had a PCN reaction causing severe rash involving mucus membranes or skin necrosis: Unsure Has patient had a PCN reaction that required hospitalization Unsure Has patient had a PCN reaction occurring within the last 10 years: No If all of the above answers are "NO", then may proceed with Cephalosporin use.    Past Medical History:  Diagnosis Date  . ADHD (attention deficit hyperactivity disorder)   . Ankle fracture 01/2017   RIGHT ANKLE  . Anxiety   . Bipolar disorder (HCC)   . Depression   . 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 Marital status: Single    Spouse name: Not on file  . Number of  children: Not on file  . Years of education: Not on file  . Highest education level: Not on file  Occupational History  . Not on file  Tobacco Use  . Smoking status: Current Every Day Smoker    Packs/day: 1.00    Years: 10.00    Pack years: 10.00    Types: Cigarettes  . Smokeless tobacco: Never Used  . Tobacco comment: off and on  Substance and Sexual Activity  . Alcohol use: Yes  . Drug use: Yes    Types:  Marijuana, Cocaine    Comment: heroin  . Sexual activity: Not on file    Comment: Unknown  Other Topics Concern  . Not on file  Social History Narrative  . Not on file   Social Determinants of Health   Financial Resource Strain: Not on file  Food Insecurity: Not on file  Transportation Needs: Not on file  Physical Activity: Not on file  Stress: Not on file  Social Connections: Not on file  Intimate Partner Violence: Not on file   Family History  Problem Relation Age of Onset  . Cancer Father   . Diabetes Maternal Grandmother   . Alzheimer's disease Paternal Grandmother   . Heart failure Paternal Grandmother   . Heart failure Paternal Grandfather   . Heart failure Maternal Grandfather    Past Surgical History:  Procedure Laterality Date  . NO PAST SURGERIES    . ORIF ANKLE FRACTURE Right 01/31/2017   Procedure: OPEN REDUCTION INTERNAL FIXATION (ORIF) RIGHT TRIMALLEOLAR ANKLE FRACTURE;  Surgeon: [name], MD;  Location: [facility] OR;  Service: Orthopedics;  Laterality: Right;     Mardella Layman, MD 11/08/20 [phone]

## 2020-11-09 LAB — CULTURE, GROUP A STREP (THRC)

## 2020-11-30 ENCOUNTER — Ambulatory Visit (HOSPITAL_COMMUNITY): Payer: No Payment, Other | Admitting: Psychiatry

## 2020-12-03 ENCOUNTER — Ambulatory Visit (HOSPITAL_COMMUNITY): Payer: No Payment, Other | Admitting: Clinical

## 2020-12-15 ENCOUNTER — Ambulatory Visit (INDEPENDENT_AMBULATORY_CARE_PROVIDER_SITE_OTHER): Payer: No Payment, Other | Admitting: Psychiatry

## 2020-12-15 ENCOUNTER — Other Ambulatory Visit: Payer: Self-pay

## 2020-12-15 ENCOUNTER — Encounter (HOSPITAL_COMMUNITY): Payer: Self-pay | Admitting: Psychiatry

## 2020-12-15 VITALS — BP 134/71 | HR 66 | Ht 73.0 in | Wt 231.0 lb

## 2020-12-15 DIAGNOSIS — F3178 Bipolar disorder, in full remission, most recent episode mixed: Secondary | ICD-10-CM

## 2020-12-15 DIAGNOSIS — F419 Anxiety disorder, unspecified: Secondary | ICD-10-CM | POA: Diagnosis not present

## 2020-12-15 MED ORDER — QUETIAPINE FUMARATE 200 MG PO TABS: 200.0000 mg | ORAL_TABLET | Freq: Every day | ORAL | 2 refills | Status: DC

## 2020-12-15 MED ORDER — OXCARBAZEPINE 600 MG PO TABS: 600.0000 mg | ORAL_TABLET | Freq: Two times a day (BID) | ORAL | 2 refills | Status: DC

## 2020-12-15 MED ORDER — BENZTROPINE MESYLATE 0.5 MG PO TABS: 0.5000 mg | ORAL_TABLET | Freq: Every day | ORAL | 2 refills | Status: DC

## 2020-12-15 MED ORDER — HYDROXYZINE HCL 25 MG PO TABS: 25.0000 mg | ORAL_TABLET | Freq: Two times a day (BID) | ORAL | 2 refills | Status: DC | PRN

## 2020-12-15 NOTE — Progress Notes (Signed)
[facility] MD/PA/NP OP Progress Note  12/15/2020 10:16 AM George [name]  MRN:  [id number]  Chief Complaint:  Chief Complaint    Follow-up    " I am doing well now."  HPI: Patient reported he is doing well. He still working at [employer]. He informed that he really likes his job however last month he was dealing with a lot of stress. He stated that everything was getting to him and he felt kind of irritable and anxious. He stated that he realized he was getting triggered a lot at work and he did not want things to escalate therefore he went back to taking Atarax that he had an old bottle of from his previous prescriber. He stated that he was prescribed Atarax 25 mg 3 times daily as needed however he has been taking it twice a day.  He stated that he felt better after he started taking Atarax and requested refills for it. He denied any other concerns at this time and reported that he has been taking all his medicines regularly.   Visit Diagnosis:    ICD-10-CM   1. Bipolar 1 disorder, mixed, full remission (HCC)  F31.78 QUEtiapine (SEROQUEL) 200 MG tablet    oxcarbazepine (TRILEPTAL) 600 MG tablet    benztropine (COGENTIN) 0.5 MG tablet  2. Anxiety  F41.9 hydrOXYzine (ATARAX/VISTARIL) 25 MG tablet    Past Psychiatric History: Bipolar 1 disorder, history of several emergency room visits as well as psychiatry hospitalizations in the past.  Past Medical History:  Past Medical History:  Diagnosis Date  . ADHD (attention deficit hyperactivity disorder)   . Ankle fracture 01/2017   RIGHT ANKLE  . Anxiety   . Bipolar disorder (HCC)   . Depression   . 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 NO PAST SURGERIES    . ORIF ANKLE FRACTURE Right 01/31/2017   Procedure: OPEN REDUCTION INTERNAL FIXATION (ORIF) RIGHT TRIMALLEOLAR ANKLE FRACTURE;  Surgeon: [name], MD;  Location: [facility] OR;  Service: Orthopedics;  Laterality: Right;    Family Psychiatric History:  see below  Family History:  Family History  Problem Relation Age of Onset  . Cancer Father   . Diabetes Maternal Grandmother   . Alzheimer's disease Paternal Grandmother   . Heart failure Paternal Grandmother   . Heart failure Paternal Grandfather   . Heart failure Maternal Grandfather     Social History:  Social History   Socioeconomic History  . Marital status: Single    Spouse name: Not on file  . Number of children: Not on file  . Years of education: Not on file  . Highest education level: Not on file  Occupational History  . Not on file  Tobacco Use  . Smoking status: Current Every Day Smoker    Packs/day: 1.00    Years: 10.00    Pack years: 10.00    Types: Cigarettes  . Smokeless tobacco: Never Used  . Tobacco comment: off and on  Substance and Sexual Activity  . Alcohol use: Yes  . Drug use: Yes    Types: Marijuana, Cocaine    Comment: heroin  . Sexual activity: Not on file    Comment: Unknown  Other Topics Concern  . Not on file  Social History Narrative  . Not on file   Social Determinants of Health   Financial Resource Strain: Not on file  Food Insecurity: Not on file  Transportation Needs: Not on file  Physical  Activity: Not on file  Stress: Not on file  Social Connections: Not on file    Allergies:  Allergies  Allergen Reactions  . Amoxicillin Swelling and Rash    "throat started to close up" Has patient had a PCN reaction causing immediate rash, facial/tongue/throat swelling, SOB or lightheadedness with hypotension: Yes Has patient had a PCN reaction causing severe rash involving mucus membranes or skin necrosis: Yes Has patient had a PCN reaction that required hospitalization No Has patient had a PCN reaction occurring within the last 10 years: No If all of the above answers are "NO", then may proceed with Cephalosporin use.   Marland Kitchen Penicillins Rash and Other (See Comments)    Has patient had a PCN reaction causing immediate rash,  facial/tongue/throat swelling, SOB or lightheadedness with hypotension: unsure Has patient had a PCN reaction causing severe rash involving mucus membranes or skin necrosis: Unsure Has patient had a PCN reaction that required hospitalization Unsure Has patient had a PCN reaction occurring within the last 10 years: No If all of the above answers are "NO", then may proceed with Cephalosporin use.    Metabolic Disorder Labs: Lab Results  Component Value Date   HGBA1C 6.2 (H) 12/10/2015   MPG 131 12/10/2015   Lab Results  Component Value Date   PROLACTIN 28.2 (H) 12/10/2015   Lab Results  Component Value Date   CHOL 119 12/10/2015   TRIG 91 12/10/2015   HDL 34 (L) 12/10/2015   CHOLHDL 3.5 12/10/2015   VLDL 18 12/10/2015   LDLCALC 67 12/10/2015   Lab Results  Component Value Date   TSH 1.744 12/10/2015    Therapeutic Level Labs: No results found for: LITHIUM No results found for: VALPROATE No components found for:  CBMZ  Current Medications: Current Outpatient Medications  Medication Sig Dispense Refill  . aspirin EC 325 MG tablet Take 1 tablet (325 mg total) by mouth 2 (two) times daily. 84 tablet 0  . hydrOXYzine (ATARAX/VISTARIL) 25 MG tablet Take 1 tablet (25 mg total) by mouth 2 (two) times daily as needed for anxiety. 60 tablet 2  . benztropine (COGENTIN) 0.5 MG tablet Take 1 tablet (0.5 mg total) by mouth at bedtime. 30 tablet 2  . oxcarbazepine (TRILEPTAL) 600 MG tablet Take 1 tablet (600 mg total) by mouth 2 (two) times daily. 60 tablet 2  . QUEtiapine (SEROQUEL) 200 MG tablet Take 1 tablet (200 mg total) by mouth at bedtime. 30 tablet 2   No current facility-administered medications for this visit.     Musculoskeletal: Strength & Muscle Tone: within normal limits Gait & Station: normal Patient leans: N/A  Psychiatric Specialty Exam: Review of Systems     Blood pressure 134/71, pulse 66, height 6\' 1"  (1.854 m), weight 231 lb (104.8 kg), SpO2 100 %.Body  mass index is 30.48 kg/m.  General Appearance: Fairly Groomed  Eye Contact:  Good  Speech:  Clear and Coherent and Normal Rate  Volume:  Normal  Mood:  Euthymic  Affect:  Congruent  Thought Process:  Goal Directed and Descriptions of Associations: Intact  Orientation:  Full (Time, Place, and Person)  Thought Content: Logical   Suicidal Thoughts:  No  Homicidal Thoughts:  No  Memory:  Immediate;   Good Recent;   Good  Judgement:  Fair  Insight:  Fair  Psychomotor Activity:  Normal  Concentration:  Concentration: Good and Attention Span: Good  Recall:  Good  Fund of Knowledge: Good  Language: Good  Akathisia:  Negative  Handed:  Right  AIMS (if indicated): not done  Assets:  Communication Skills Desire for Improvement Financial Resources/Insurance Housing Social Support Transportation Vocational/Educational  ADL's:  Intact  Cognition: WNL  Sleep:  Good   Screenings: AIMS   Flowsheet Row [facility]  AIMS Total Score 0    AUDIT   Flowsheet Row [facility]  Alcohol Use Disorder Identification Test Final Score (AUDIT) 0       Assessment and Plan: Patient appears to be doing well for now.  He has restarted taking hydroxyzine for anxiety.  1. Bipolar 1 disorder, mixed, full remission (HCC)  - QUEtiapine (SEROQUEL) 200 MG tablet; Take 1 tablet (200 mg total) by mouth at bedtime.  Dispense: 30 tablet; Refill: 2 - oxcarbazepine (TRILEPTAL) 600 MG tablet; Take 1 tablet (600 mg total) by mouth 2 (two) times daily.  Dispense: 60 tablet; Refill: 2 - benztropine (COGENTIN) 0.5 MG tablet; Take 1 tablet (0.5 mg total) by mouth at bedtime.  Dispense: 30 tablet; Refill: 2  2. Anxiety  - hydrOXYzine (ATARAX/VISTARIL) 25 MG tablet; Take 1 tablet (25 mg total) by mouth 2 (two) times daily as needed for anxiety.  Dispense: 60 tablet;  Refill: 2  Continue same regimen. Continue individual therapy. Follow-up in 3 months    Zena Amos, MD 12/15/2020, 10:16 AM

## 2020-12-17 ENCOUNTER — Ambulatory Visit (INDEPENDENT_AMBULATORY_CARE_PROVIDER_SITE_OTHER): Payer: No Payment, Other | Admitting: Clinical

## 2020-12-17 ENCOUNTER — Other Ambulatory Visit: Payer: Self-pay

## 2020-12-17 DIAGNOSIS — F3178 Bipolar disorder, in full remission, most recent episode mixed: Secondary | ICD-10-CM

## 2020-12-18 NOTE — Progress Notes (Signed)
   THERAPIST PROGRESS NOTE  Session Time: 45 minutes  Participation Level: Active  Behavioral Response: CasualAlertEuthymic  Type of Therapy: Individual Therapy  Treatment Goals addressed: Coping  Interventions: CBT  Summary:  George [name] is a 30 y.o. male who presents for the scheduled session oriented times five, appropriately dressed, and friendly. Client denied hallucinations and delusions. Client reported on today he is doing okay. Client reported since the last session he has enforced some boundaries but has anxiety when other do not respect them. Client reported the anxiety comes from thinking "what will other people think if they see this". Client reported work has been difficult to navigate because of his coworkers drama. Client reported having a habit of letting things go and then misplacing his anger in the wrong place at the wrong person. Client reported he started taking his anxiety medication again because of work which has been helpful. Client reported he has a desire to make more friends but he has a hard time trusting people.    Suicidal/Homicidal: Nowithout intent/plan  Therapist Response:  Therapist began the session asking how he has been doing since last seen. Therapist actively listened to the clients thoughts and feelings. Therapist used CBT to discuss cognitive distortions and asked opened questions to the client about maybe some situations he could view differently. Therapist assigned the client homework to identify his overthinking patterns and label if they are negative or positive. Client was scheduled for next appointment.    Plan: Return again in 5 weeks for individual therapy.  Diagnosis: Bipolar 1 disorder, mixed full remission  Neena Rhymes Cherree Conerly, LCSW 12/17/2020

## 2021-01-19 ENCOUNTER — Other Ambulatory Visit: Payer: Self-pay

## 2021-01-19 ENCOUNTER — Ambulatory Visit (INDEPENDENT_AMBULATORY_CARE_PROVIDER_SITE_OTHER): Payer: No Payment, Other | Admitting: Clinical

## 2021-01-19 DIAGNOSIS — F3178 Bipolar disorder, in full remission, most recent episode mixed: Secondary | ICD-10-CM
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
## 2021-02-15 ENCOUNTER — Other Ambulatory Visit: Payer: Self-pay

## 2021-02-15 ENCOUNTER — Ambulatory Visit (INDEPENDENT_AMBULATORY_CARE_PROVIDER_SITE_OTHER): Payer: No Payment, Other | Admitting: Clinical

## 2021-02-15 DIAGNOSIS — F3178 Bipolar disorder, in full remission, most recent episode mixed: Secondary | ICD-10-CM | POA: Diagnosis not present
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
## 2021-03-09 ENCOUNTER — Ambulatory Visit (INDEPENDENT_AMBULATORY_CARE_PROVIDER_SITE_OTHER): Payer: No Payment, Other | Admitting: Psychiatry

## 2021-03-09 ENCOUNTER — Other Ambulatory Visit: Payer: Self-pay

## 2021-03-09 ENCOUNTER — Encounter (HOSPITAL_COMMUNITY): Payer: Self-pay | Admitting: Psychiatry

## 2021-03-09 DIAGNOSIS — F3178 Bipolar disorder, in full remission, most recent episode mixed: Secondary | ICD-10-CM | POA: Diagnosis not present

## 2021-03-09 DIAGNOSIS — F419 Anxiety disorder, unspecified: Secondary | ICD-10-CM | POA: Diagnosis not present

## 2021-03-09 MED ORDER — BENZTROPINE MESYLATE 0.5 MG PO TABS: 0.5000 mg | ORAL_TABLET | Freq: Every day | ORAL | 2 refills | Status: DC

## 2021-03-09 MED ORDER — OXCARBAZEPINE 600 MG PO TABS: 600.0000 mg | ORAL_TABLET | Freq: Two times a day (BID) | ORAL | 2 refills | Status: DC

## 2021-03-09 MED ORDER — HYDROXYZINE HCL 25 MG PO TABS: 25.0000 mg | ORAL_TABLET | Freq: Two times a day (BID) | ORAL | 2 refills | Status: DC | PRN

## 2021-03-09 MED ORDER — QUETIAPINE FUMARATE 200 MG PO TABS: 200.0000 mg | ORAL_TABLET | Freq: Every day | ORAL | 2 refills | Status: DC

## 2021-03-09 NOTE — Progress Notes (Signed)
BH MD/PA/NP OP Progress Note  03/09/2021 11:01 AM George [name]  MRN:  [id number]  Chief Complaint:  Chief Complaint    Medication Management    " I am doing well."  HPI: Patient reported he is doing well.  He stated that his mood has been stable.  His anxiety is very well managed with the help of hydroxyzine.  He stated that he takes hydroxyzine only at work and he does not usually needed otherwise. He feels that hydroxyzine helps to take the edge off and things get hectic at work.  He denied any other concerns or issues today. He reported he is sleeping well at night.  He denied anything exciting that he is looking forward to but would like things to continue the way they are.   Visit Diagnosis:    ICD-10-CM   1. Bipolar 1 disorder, mixed, full remission (HCC)  F31.78 QUEtiapine (SEROQUEL) 200 MG tablet    oxcarbazepine (TRILEPTAL) 600 MG tablet    benztropine (COGENTIN) 0.5 MG tablet  2. Anxiety  F41.9 hydrOXYzine (ATARAX/VISTARIL) 25 MG tablet    Past Psychiatric History: Bipolar 1 disorder, history of several emergency room visits as well as psychiatry hospitalizations in the past.  Past Medical History:  Past Medical History:  Diagnosis Date  . ADHD (attention deficit hyperactivity disorder)   . Ankle fracture 01/2017   RIGHT ANKLE  . Anxiety   . Bipolar disorder (HCC)   . Depression   . 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 NO PAST SURGERIES    . ORIF ANKLE FRACTURE Right 01/31/2017   Procedure: OPEN REDUCTION INTERNAL FIXATION (ORIF) RIGHT TRIMALLEOLAR ANKLE FRACTURE;  Surgeon: [name], MD;  Location: [facility];  Service: Orthopedics;  Laterality: Right;    Family Psychiatric History: see below  Family History:  Family History  Problem Relation Age of Onset  . Cancer Father   . Diabetes Maternal Grandmother   . Alzheimer's disease Paternal Grandmother   . Heart failure Paternal Grandmother   . Heart failure  Paternal Grandfather   . Heart failure Maternal Grandfather     Social History:  Social History   Socioeconomic History  . Marital status: Single    Spouse name: Not on file  . Number of children: Not on file  . Years of education: Not on file  . Highest education level: Not on file  Occupational History  . Not on file  Tobacco Use  . Smoking status: Current Every Day Smoker    Packs/day: 1.00    Years: 10.00    Pack years: 10.00    Types: Cigarettes  . Smokeless tobacco: Never Used  . Tobacco comment: off and on  Substance and Sexual Activity  . Alcohol use: Yes  . Drug use: Yes    Types: Marijuana, Cocaine    Comment: heroin  . Sexual activity: Not on file    Comment: Unknown  Other Topics Concern  . Not on file  Social History Narrative  . Not on file   Social Determinants of Health   Financial Resource Strain: Not on file  Food Insecurity: Not on file  Transportation Needs: Not on file  Physical Activity: Not on file  Stress: Not on file  Social Connections: Not on file    Allergies:  Allergies  Allergen Reactions  . Amoxicillin Swelling and Rash    "throat started to close up" Has patient had a PCN reaction causing immediate  rash, facial/tongue/throat swelling, SOB or lightheadedness with hypotension: Yes Has patient had a PCN reaction causing severe rash involving mucus membranes or skin necrosis: Yes Has patient had a PCN reaction that required hospitalization No Has patient had a PCN reaction occurring within the last 10 years: No If all of the above answers are "NO", then may proceed with Cephalosporin use.   Marland Kitchen Penicillins Rash and Other (See Comments)    Has patient had a PCN reaction causing immediate rash, facial/tongue/throat swelling, SOB or lightheadedness with hypotension: unsure Has patient had a PCN reaction causing severe rash involving mucus membranes or skin necrosis: Unsure Has patient had a PCN reaction that required hospitalization  Unsure Has patient had a PCN reaction occurring within the last 10 years: No If all of the above answers are "NO", then may proceed with Cephalosporin use.    Metabolic Disorder Labs: Lab Results  Component Value Date   HGBA1C 6.2 (H) 12/10/2015   MPG 131 12/10/2015   Lab Results  Component Value Date   PROLACTIN 28.2 (H) 12/10/2015   Lab Results  Component Value Date   CHOL 119 12/10/2015   TRIG 91 12/10/2015   HDL 34 (L) 12/10/2015   CHOLHDL 3.5 12/10/2015   VLDL 18 12/10/2015   LDLCALC 67 12/10/2015   Lab Results  Component Value Date   TSH 1.744 12/10/2015    Therapeutic Level Labs: No results found for: LITHIUM No results found for: VALPROATE No components found for:  CBMZ  Current Medications: Current Outpatient Medications  Medication Sig Dispense Refill  . benztropine (COGENTIN) 0.5 MG tablet Take 1 tablet (0.5 mg total) by mouth at bedtime. 30 tablet 2  . hydrOXYzine (ATARAX/VISTARIL) 25 MG tablet Take 1 tablet (25 mg total) by mouth 2 (two) times daily as needed for anxiety. 60 tablet 2  . oxcarbazepine (TRILEPTAL) 600 MG tablet Take 1 tablet (600 mg total) by mouth 2 (two) times daily. 60 tablet 2  . QUEtiapine (SEROQUEL) 200 MG tablet Take 1 tablet (200 mg total) by mouth at bedtime. 30 tablet 2   No current facility-administered medications for this visit.     Musculoskeletal: Strength & Muscle Tone: within normal limits Gait & Station: normal Patient leans: N/A  Psychiatric Specialty Exam: Review of Systems     Blood pressure 128/63, pulse 84, height 6\' 1"  (1.854 m), weight 229 lb (103.9 kg), SpO2 100 %.Body mass index is 30.21 kg/m.  General Appearance: Fairly Groomed  Eye Contact:  Good  Speech:  Clear and Coherent and Normal Rate  Volume:  Normal  Mood:  Euthymic  Affect:  Congruent  Thought Process:  Goal Directed and Descriptions of Associations: Intact  Orientation:  Full (Time, Place, and Person)  Thought Content: Logical    Suicidal Thoughts:  No  Homicidal Thoughts:  No  Memory:  Immediate;   Good Recent;   Good  Judgement:  Fair  Insight:  Fair  Psychomotor Activity:  Normal  Concentration:  Concentration: Good and Attention Span: Good  Recall:  Good  Fund of Knowledge: Good  Language: Good  Akathisia:  Negative  Handed:  Right  AIMS (if indicated): not done  Assets:  Communication Skills Desire for Improvement Financial Resources/Insurance Housing Social Support Transportation Vocational/Educational  ADL's:  Intact  Cognition: WNL  Sleep:  Good   Screenings: AIMS   Flowsheet Row [facility]  AIMS Total Score 0    AUDIT   Flowsheet Row  [facility]  Alcohol Use Disorder Identification Test Final Score (AUDIT) 0    GAD-7   Flowsheet Row Counselor from 02/15/2021 in [facility]  Total GAD-7 Score 2    PHQ2-9   Flowsheet Row Counselor from 02/15/2021 in [facility]  PHQ-2 Total Score 0  PHQ-9 Total Score 0       Assessment and Plan: Patient remains stable on his current regimen.   1. Bipolar 1 disorder, mixed, full remission (HCC)  - QUEtiapine (SEROQUEL) 200 MG tablet; Take 1 tablet (200 mg total) by mouth at bedtime.  Dispense: 30 tablet; Refill: 2 - oxcarbazepine (TRILEPTAL) 600 MG tablet; Take 1 tablet (600 mg total) by mouth 2 (two) times daily.  Dispense: 60 tablet; Refill: 2 - benztropine (COGENTIN) 0.5 MG tablet; Take 1 tablet (0.5 mg total) by mouth at bedtime.  Dispense: 30 tablet; Refill: 2  2. Anxiety  - hydrOXYzine (ATARAX/VISTARIL) 25 MG tablet; Take 1 tablet (25 mg total) by mouth 2 (two) times daily as needed for anxiety.  Dispense: 60 tablet; Refill: 2  Continue same regimen. Continue individual therapy. Follow-up in 3 months    Zena Amos,  MD 03/09/2021, 11:01 AM

## 2021-03-18 ENCOUNTER — Other Ambulatory Visit: Payer: Self-pay

## 2021-03-18 ENCOUNTER — Ambulatory Visit (INDEPENDENT_AMBULATORY_CARE_PROVIDER_SITE_OTHER): Payer: No Payment, Other | Admitting: Clinical

## 2021-03-18 DIAGNOSIS — F3178 Bipolar disorder, in full remission, most recent episode mixed: Secondary | ICD-10-CM | POA: Diagnosis not present

## 2021-03-27 NOTE — Progress Notes (Signed)
   THERAPIST PROGRESS NOTE  Session Time: 53 minutes  Participation Level: Active  Behavioral Response: CasualAlertEuthymic  Type of Therapy: Individual Therapy  Treatment Goals addressed: Coping  Interventions: CBT  Summary:  George [name] is a 30 y.o. male who presents for the scheduled session oriented times five, appropriately dressed, and friendly. Client denied hallucinations and delusions. Client reported on today he is feeling well. Client reported he had a good mothers day with his parents. Client reported he feels that he and his parents are making progress since their last confrontation. Client reported he is not sure how his siblings relationship is though they have not spoken since the incident. Client reported overall he feel like he is doing a good job with keeping things under control. Client reported he has noticed he does not have to take the hydroxyzine to be around his family. Client reported defensive communication with his family because during childhood he was always to blame. Client reported however for work he does take it. Client reported coworkers have been a trigger for anger. Client reported his new opposite reaction is to react with kindness.     Suicidal/Homicidal: Nowithout intent/plan  Therapist Response: Therapist began the session asking how he has been doing since last seen. Therapist used CBT to actively listen to the clients thoughts and feelings. Therapist used CBT to ask about the origin for the clients way of thinking. Therapist assigned the client homework to identify thinking styes that promote anxiety in certain situations and challenge it. Client was scheduled for next appointment.    Plan: Return again in 5 weeks.  Diagnosis: Bipolar 1 disorder, mixed, full remission  Neena Rhymes Loys Hoselton, LCSW 03/18/2021

## 2021-05-17 ENCOUNTER — Ambulatory Visit (HOSPITAL_COMMUNITY): Payer: No Payment, Other | Admitting: Clinical

## 2021-06-03 ENCOUNTER — Ambulatory Visit (HOSPITAL_COMMUNITY): Payer: Self-pay | Admitting: Clinical

## 2021-06-10 ENCOUNTER — Encounter (HOSPITAL_COMMUNITY): Payer: No Payment, Other | Admitting: Physician Assistant

## 2021-06-17 ENCOUNTER — Ambulatory Visit (HOSPITAL_COMMUNITY): Payer: Self-pay | Admitting: Clinical

## 2021-07-01 ENCOUNTER — Ambulatory Visit (HOSPITAL_COMMUNITY): Payer: Self-pay | Admitting: Clinical

## 2021-07-12 ENCOUNTER — Telehealth (HOSPITAL_COMMUNITY): Payer: Self-pay | Admitting: *Deleted

## 2021-07-12 NOTE — Telephone Encounter (Signed)
Patient called for his Seroquel and Hydroxyzine and then later today I also got a pharmacy request from [place] re them too. He has not been seen since May by Dr Evelene Croon. He has an appt with Eddie PA on 08/09/20. Will ask Link Snuffer if he is willing to bridge his medicine till his appt in Oct or if he would like me to tell him to walk in and be seen for his rx.

## 2021-07-13 ENCOUNTER — Other Ambulatory Visit (HOSPITAL_COMMUNITY): Payer: Self-pay | Admitting: Physician Assistant

## 2021-07-13 ENCOUNTER — Telehealth (HOSPITAL_COMMUNITY): Payer: Self-pay | Admitting: *Deleted

## 2021-07-13 DIAGNOSIS — F419 Anxiety disorder, unspecified: Secondary | ICD-10-CM

## 2021-07-13 DIAGNOSIS — F3178 Bipolar disorder, in full remission, most recent episode mixed: Secondary | ICD-10-CM

## 2021-07-13 MED ORDER — HYDROXYZINE HCL 25 MG PO TABS: 25.0000 mg | ORAL_TABLET | Freq: Two times a day (BID) | ORAL | 0 refills | Status: DC | PRN

## 2021-07-13 MED ORDER — QUETIAPINE FUMARATE 200 MG PO TABS: 200.0000 mg | ORAL_TABLET | Freq: Every day | ORAL | 0 refills | Status: DC

## 2021-07-13 NOTE — Telephone Encounter (Signed)
Eddie PA agreed to bridge him his Seroquel till his next appt in Oct. Called patient to inform him of this and he plans to pick med up in the am, he has one last dose available to him for tonight.

## 2021-07-13 NOTE — Telephone Encounter (Signed)
Patient's medication sent to preferred pharmacy 

## 2021-07-13 NOTE — Telephone Encounter (Signed)
Provider contacted by Orpah Clinton. Reola Calkins, Charity fundraiser. Patient's medication sent to preferred pharmacy.

## 2021-07-13 NOTE — Progress Notes (Signed)
Provider was contacted by Orpah Clinton. Reola Calkins, RN regarding medication refills. Provider to bridge patient until next follow up appointment scheduled for 08/09/2021. Patient's medication sent to pharmacy of choice.

## 2021-08-01 ENCOUNTER — Ambulatory Visit (INDEPENDENT_AMBULATORY_CARE_PROVIDER_SITE_OTHER): Payer: No Payment, Other | Admitting: Clinical

## 2021-08-01 ENCOUNTER — Other Ambulatory Visit: Payer: Self-pay

## 2021-08-01 DIAGNOSIS — F3178 Bipolar disorder, in full remission, most recent episode mixed: Secondary | ICD-10-CM

## 2021-08-01 NOTE — Progress Notes (Signed)
   THERAPIST PROGRESS NOTE  Session Time: 45 minutes  Participation Level: Active  Behavioral Response: CasualAlertEuthymic  Type of Therapy: Individual Therapy  Treatment Goals addressed: Coping  Interventions: CBT and Supportive  Summary:  George [name] is a 30 y.o. male who presents for the scheduled session oriented times five, appropriately dressed and friendly. Client denied hallucinations and delusions. Client reported since he was last seen he has been doing well. 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 Client reported he is happy with the promotion. Client reported the transition has gone well and has not had issues with giving direction to employees he is over. Client reported a positive has also been on a successful family vacation to [place]. Client reported he has been coming into feeling content with enforcing mental and physical boundaries with his family. Client reported overall he has a good relationship with his family but is coming to understand that his parents have certain personality types that cause him to react in negatively in the past but is learning to not react. Client reported his success with maintaining mental stability is also the routine he has established for himself. Client reported " I've come a long way but everyday isn't always perfect". Client reported he is continuously learning and practicing to "relinquish control" in certain situations that require problem solving".       Suicidal/Homicidal: Nowithout intent/plan  Therapist Response:  Therapist began the appointment asking the client how he has been doing since last seen. Therapist used CBT to utilize active listening and positive emotional support. Therapist used CBT to engage with the client to ask open ended questions about his success of implementing coping skills and positive thought processes towards ongoing challenges. Therapist used CBT to engage and acknowledge his positive  goals met. Therapist used CBT to engage with the client to discuss boundaries and effective communication. Therapist assigned the client homework to continue practicing routine, self care, and positive communication. Client was scheduled for next appointment.     Plan: Return again in 4 weeks.  Diagnosis: Bipolar 1 disorder, mixed, full remission    [name], LCSW 08/01/2021

## 2021-08-09 ENCOUNTER — Ambulatory Visit (INDEPENDENT_AMBULATORY_CARE_PROVIDER_SITE_OTHER): Payer: No Payment, Other | Admitting: Physician Assistant

## 2021-08-09 ENCOUNTER — Other Ambulatory Visit: Payer: Self-pay

## 2021-08-09 ENCOUNTER — Encounter (HOSPITAL_COMMUNITY): Payer: Self-pay | Admitting: Physician Assistant

## 2021-08-09 DIAGNOSIS — F419 Anxiety disorder, unspecified: Secondary | ICD-10-CM | POA: Diagnosis not present

## 2021-08-09 DIAGNOSIS — F3178 Bipolar disorder, in full remission, most recent episode mixed: Secondary | ICD-10-CM

## 2021-08-09 MED ORDER — HYDROXYZINE HCL 25 MG PO TABS: 25.0000 mg | ORAL_TABLET | Freq: Two times a day (BID) | ORAL | 0 refills | Status: DC | PRN

## 2021-08-09 MED ORDER — BENZTROPINE MESYLATE 0.5 MG PO TABS: 0.5000 mg | ORAL_TABLET | Freq: Every day | ORAL | 2 refills | Status: DC

## 2021-08-09 MED ORDER — OXCARBAZEPINE 600 MG PO TABS: 600.0000 mg | ORAL_TABLET | Freq: Two times a day (BID) | ORAL | 2 refills | Status: DC

## 2021-08-09 MED ORDER — QUETIAPINE FUMARATE 200 MG PO TABS: 200.0000 mg | ORAL_TABLET | Freq: Every day | ORAL | 2 refills | Status: DC

## 2021-08-09 NOTE — Progress Notes (Signed)
BH MD/PA/NP OP Progress Note  08/09/2021 9:16 PM George [name]  MRN:  [id number]  Chief Complaint: Follow up and medication management  HPI:   George [name] is a 30 year old male with a past psychiatric history significant for bipolar 1 disorder and anxiety who presents to [facility] for follow-up and medication management.  Patient is currently being managed on the following medications:  Seroquel 200 mg at bedtime Oxcarbazepine 600 mg 2 times daily Benztropine 0.5 mg at bedtime Hydroxyzine 25 mg 2 times daily as needed  Patient reports no issues or concerns regarding his current medication regimen.  Patient denies the need for dosage adjustments at this time and is requesting refills on all his medications following the conclusion of the encounter.  The only issue the patient has today is in regards to his sexual activity.  Patient feels that he has been experiencing sexual adverse effects when taking his medications.  Patient states that his sexual dysfunction happens intermittently.  Patient denies any other symptoms nor does he denies symptoms related to his mental health.  Patient reports that he is health conscious and works out.  Patient denies depressive episodes but does express some anxiety.  Patient attributes his anxiety to an "up tick" and stress due to his recent promotion at his place of employ.  Patient reports no other issues or concerns at this time.  She is alert and oriented x4, calm, cooperative, and fully engaged in conversation during the encounter.  Patient states that his mood has been all right as of late.  Patient denies suicidal or homicidal ideations.  He further denies auditory or visual hallucinations and does not appear to be responding to internal/external stimuli.  Patient endorses good appetite and eats on average 2 times a day.  Patient endorses alcohol consumption occasionally.  Patient denies tobacco use stating  that he quit.  Patient endorses illicit drug use in the form of marijuana use which he does every day.  Visit Diagnosis:    ICD-10-CM   1. Bipolar 1 disorder, mixed, full remission (HCC)  F31.78 QUEtiapine (SEROQUEL) 200 MG tablet    oxcarbazepine (TRILEPTAL) 600 MG tablet    benztropine (COGENTIN) 0.5 MG tablet    2. Anxiety  F41.9 hydrOXYzine (ATARAX/VISTARIL) 25 MG tablet      Past Psychiatric History:  Bipolar 1 disorder  History of several emergency room visits as well as psychiatry hospitalizations in the past.  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    Past Surgical History:  Procedure Laterality Date   NO PAST SURGERIES     ORIF ANKLE FRACTURE Right 01/31/2017   Procedure: OPEN REDUCTION INTERNAL FIXATION (ORIF) RIGHT TRIMALLEOLAR ANKLE FRACTURE;  Surgeon: [name], MD;  Location: [facility];  Service: Orthopedics;  Laterality: Right;    Family Psychiatric History:  No reported family history of psychiatric illness  Family History:  Family History  Problem Relation Age of Onset   Cancer Father    Diabetes Maternal Grandmother    Alzheimer's disease Paternal Grandmother    Heart failure Paternal Grandmother    Heart failure Paternal Grandfather    Heart failure Maternal Grandfather     Social History:  Social History   Socioeconomic History   Marital  status: Single    Spouse name: Not on file   Number of children: Not on file   Years of education: Not on file   Highest education level: Not on file  Occupational History   Not on file  Tobacco Use   Smoking status: Every Day    Packs/day: 1.00    Years: 10.00    Pack years: 10.00    Types: Cigarettes   Smokeless tobacco: Never   Tobacco comments:    off and on  Substance and Sexual Activity   Alcohol use:  Yes   Drug use: Yes    Types: Marijuana, Cocaine    Comment: heroin   Sexual activity: Not on file    Comment: Unknown  Other Topics Concern   Not on file  Social History Narrative   Not on file   Social Determinants of Health   Financial Resource Strain: Not on file  Food Insecurity: Not on file  Transportation Needs: Not on file  Physical Activity: Not on file  Stress: Not on file  Social Connections: Not on file    Allergies:  Allergies  Allergen Reactions   Amoxicillin Swelling and Rash    "throat started to close up" Has patient had a PCN reaction causing immediate rash, facial/tongue/throat swelling, SOB or lightheadedness with hypotension: Yes Has patient had a PCN reaction causing severe rash involving mucus membranes or skin necrosis: Yes Has patient had a PCN reaction that required hospitalization No Has patient had a PCN reaction occurring within the last 10 years: No If all of the above answers are "NO", then may proceed with Cephalosporin use.    Penicillins Rash and Other (See Comments)    Has patient had a PCN reaction causing immediate rash, facial/tongue/throat swelling, SOB or lightheadedness with hypotension: unsure Has patient had a PCN reaction causing severe rash involving mucus membranes or skin necrosis: Unsure Has patient had a PCN reaction that required hospitalization Unsure Has patient had a PCN reaction occurring within the last 10 years: No If all of the above answers are "NO", then may proceed with Cephalosporin use.    Metabolic Disorder Labs: Lab Results  Component Value Date   HGBA1C 6.2 (H) 12/10/2015   MPG 131 12/10/2015   Lab Results  Component Value Date   PROLACTIN 28.2 (H) 12/10/2015   Lab Results  Component Value Date   CHOL 119 12/10/2015   TRIG 91 12/10/2015   HDL 34 (L) 12/10/2015   CHOLHDL 3.5 12/10/2015   VLDL 18 12/10/2015   LDLCALC 67 12/10/2015   Lab Results  Component Value Date   TSH 1.744 12/10/2015     Therapeutic Level Labs: No results found for: LITHIUM No results found for: VALPROATE No components found for:  CBMZ  Current Medications: Current Outpatient Medications  Medication Sig Dispense Refill   benztropine (COGENTIN) 0.5 MG tablet Take 1 tablet (0.5 mg total) by mouth at bedtime. 30 tablet 2   hydrOXYzine (ATARAX/VISTARIL) 25 MG tablet Take 1 tablet (25 mg total) by mouth 2 (two) times daily as needed for anxiety. 60 tablet 0   oxcarbazepine (TRILEPTAL) 600 MG tablet Take 1 tablet (600 mg total) by mouth 2 (two) times daily. 60 tablet 2   QUEtiapine (SEROQUEL) 200 MG tablet Take 1 tablet (200 mg total) by mouth at bedtime. 30 tablet 2   No current facility-administered medications for this visit.     Musculoskeletal: Strength & Muscle Tone: within normal limits Gait & Station: normal Patient leans:  N/A  Psychiatric Specialty Exam: Review of Systems  Psychiatric/Behavioral:  Negative for decreased concentration, dysphoric mood, hallucinations, self-injury, sleep disturbance and suicidal ideas. The patient is not nervous/anxious and is not hyperactive.    There were no vitals taken for this visit.There is no height or weight on file to calculate BMI.  General Appearance: Well Groomed  Eye Contact:  Good  Speech:  Clear and Coherent and Normal Rate  Volume:  Normal  Mood:  Euthymic  Affect:  Appropriate  Thought Process:  Coherent and Descriptions of Associations: Intact  Orientation:  Full (Time, Place, and Person)  Thought Content: WDL   Suicidal Thoughts:  No  Homicidal Thoughts:  No  Memory:  Immediate;   Good Recent;   Good Remote;   Good  Judgement:  Good  Insight:  Good  Psychomotor Activity:  Normal  Concentration:  Concentration: Good and Attention Span: Good  Recall:  Good  Fund of Knowledge: Good  Language: Good  Akathisia:  NA  Handed:  Right  AIMS (if indicated): not done  Assets:  Communication Skills Desire for Improvement Financial  Resources/Insurance Housing Social Support Transportation Vocational/Educational  ADL's:  Intact  Cognition: WNL  Sleep:  Good   Screenings: AIMS    Flowsheet Row [facility]  AIMS Total Score 0      AUDIT    Flowsheet Row Ad[facility]  Alcohol Use Disorder Identification Test Final Score (AUDIT) 0      GAD-7    Flowsheet Row Office Visit from 08/09/2021 in [facility] Counselor from 02/15/2021 in [facility]  Total GAD-7 Score 0 2      PHQ2-9    Flowsheet Row Office Visit from 08/09/2021 in [facility] Counselor from 02/15/2021 in [facility]  PHQ-2 Total Score 0 0  PHQ-9 Total Score -- 0      Flowsheet Row Office Visit from 08/09/2021 in [facility]  C-SSRS RISK CATEGORY No Risk        Assessment and Plan:   George [name] is a 30 year old male with a past psychiatric history significant for bipolar 1 disorder and anxiety who presents to [facility] for follow-up and medication management.  Patient reports no issues or concerns regarding his current medication regimen.  Patient expresses that he has been experiencing sexual side effects and inquires if his medications may be the root of his issue.  Patient was informed that his medications are not normally known to cause sexual dysfunction.  Patient was encouraged to continue monitoring his symptoms and to continue dieting and exercising.  Patient was agreeable to recommendation.  Patient's medications to be e-prescribed to pharmacy of choice.  1. Bipolar 1 disorder, mixed, full remission (HCC)  - QUEtiapine (SEROQUEL) 200 MG tablet; Take 1 tablet (200 mg total) by mouth at bedtime.   Dispense: 30 tablet; Refill: 2 - oxcarbazepine (TRILEPTAL) 600 MG tablet; Take 1 tablet (600 mg total) by mouth 2 (two) times daily.  Dispense: 60 tablet; Refill: 2 - benztropine (COGENTIN) 0.5 MG tablet; Take 1 tablet (0.5 mg total) by mouth at bedtime.  Dispense: 30 tablet; Refill: 2  2. Anxiety  - hydrOXYzine (ATARAX/VISTARIL) 25 MG tablet; Take 1 tablet (25 mg total) by mouth 2 (two) times daily as needed for anxiety.  Dispense: 60  tablet; Refill: 0  Patient to follow up on October 24th, 2022 Provider spent a total of 15 minutes with the patient/reviewing patient's chart  Meta Hatchet, PA 08/09/2021, 9:16 PM

## 2021-08-19 ENCOUNTER — Encounter (HOSPITAL_COMMUNITY): Payer: Self-pay

## 2021-08-22 ENCOUNTER — Ambulatory Visit (INDEPENDENT_AMBULATORY_CARE_PROVIDER_SITE_OTHER): Payer: No Payment, Other | Admitting: Clinical

## 2021-08-22 ENCOUNTER — Encounter (HOSPITAL_COMMUNITY): Payer: No Payment, Other | Admitting: Physician Assistant

## 2021-08-22 ENCOUNTER — Other Ambulatory Visit: Payer: Self-pay

## 2021-08-22 DIAGNOSIS — F3178 Bipolar disorder, in full remission, most recent episode mixed: Secondary | ICD-10-CM

## 2021-08-22 NOTE — Progress Notes (Signed)
   THERAPIST PROGRESS NOTE  Session Time: 40 minutes  Participation Level: Active  Behavioral Response: CasualAlertEuthymic  Type of Therapy: Individual Therapy  Treatment Goals addressed: Coping  Interventions: CBT and Supportive  Summary:  George [name] is a 30 y.o. male who presents for the scheduled session oriented x5, appropriately dressed, and friendly.  Client denied hallucinations and delusions.  Client reported since he was last seen he has continued to do very well.  Client reported work has been going well and he feels like he is adjusting to the new team the position at [employer].  Client reported someone left the same medication wearing his old shift time hours and his previous boss asked if he would like to move positions. Client reported he would like to move back but usually goes he is in a good position that he is not stressed about it.  Client stated "I feel like I have something to lose now".  Client reported he can see himself taking the skills that he has been from this job to apply to a higher up position in the future.  Client reported otherwise things are going steady with his family.  Client reported he is now in a new relationship with a girl that works at this time job location as him.  Client reported he does have some nervousness because of past relationships he has been in.  Client reported overall this particular thing has been a good person to him and has not given him a reason to doubt her.  Client reported his medication management has been working as well and he does not want to make adjustments to that.  Client reported since last year he feels that he has done a complete 360 towards improvement.  Client reported he feels more stable.  Client reported he now understands that after having started his job during the pandemic although it has not been easy has also taught him a lot.    Suicidal/Homicidal: Nowithout intent/plan  Therapist Response:  Therapist  began the appointment asking the client how he has been doing since last seen. Therapist used CBT to utilize active listening and positive emotional support. Therapist used CBT to engage with client asking to identify his evidence for positive changes for mental and physical wellbeing. Therapist used CBT to reinforce client's involvement of positive changes. Therapist assigned client homework to continue using appropriate assertive communication and problem-solving skills. Client was scheduled for next appointment.     Plan: Return again in 5 weeks.  Diagnosis: Bipolar 1 disorder, mixed, full remission  Neena Rhymes Jarelly Rinck, LCSW 08/22/2021

## 2021-08-29 ENCOUNTER — Ambulatory Visit (INDEPENDENT_AMBULATORY_CARE_PROVIDER_SITE_OTHER): Payer: No Payment, Other | Admitting: Clinical

## 2021-08-29 ENCOUNTER — Other Ambulatory Visit: Payer: Self-pay

## 2021-08-29 ENCOUNTER — Encounter (HOSPITAL_COMMUNITY): Payer: No Payment, Other | Admitting: Physician Assistant

## 2021-08-29 DIAGNOSIS — F3178 Bipolar disorder, in full remission, most recent episode mixed: Secondary | ICD-10-CM | POA: Diagnosis not present

## 2021-08-29 NOTE — Progress Notes (Signed)
   THERAPIST PROGRESS NOTE  Session Time: 16 minutes  Participation Level: Active  Behavioral Response: CasualAlertEuthymic  Type of Therapy: Individual Therapy  Treatment Goals addressed: Coping  Interventions: CBT and Supportive  Summary:  George [name] is a 30 y.o. male who presents for the scheduled session oriented x5, appropriately dressed, and friendly.  Client denied hallucinations and delusions.  Client reported on today he is doing very well.  Client reported since he was last seen he was able to switch his shift back to the daytime with the same position.  Client reported it was a surprise to him that it was able to have been as quick and easy as it did.  Client reported otherwise he has no other updates and he is doing well.  Client reported since he was just seen last week he does not have other concerns at this time that he needs to discuss.    Suicidal/Homicidal: Nowithout intent/plan  Therapist Response:  Therapist began the appointment asking the client has been doing since last seen. Therapist used CBT to utilize active listening and positive emotional support towards her thoughts and feelings. Therapist used CBT to engage with client to acknowledge his accomplishment of using positive communication skills to improve his employment situation. Therapist used CBT to assigned client homework to reinforce the practice of positive self-care and medication compliance. Client was scheduled for next appointment.     Plan: Return again in 7 weeks.  Diagnosis: Bipolar 1 disorder, mixed, full remission   George Rhymes Zedrick Springsteen, LCSW 08/29/2021

## 2021-09-23 ENCOUNTER — Encounter (HOSPITAL_COMMUNITY): Payer: No Payment, Other | Admitting: Physician Assistant

## 2021-09-30 ENCOUNTER — Encounter (HOSPITAL_COMMUNITY): Payer: No Payment, Other | Admitting: Physician Assistant

## 2021-10-06 ENCOUNTER — Ambulatory Visit (INDEPENDENT_AMBULATORY_CARE_PROVIDER_SITE_OTHER): Payer: No Payment, Other | Admitting: Clinical

## 2021-10-06 DIAGNOSIS — F3178 Bipolar disorder, in full remission, most recent episode mixed: Secondary | ICD-10-CM

## 2021-10-06 NOTE — Progress Notes (Signed)
   THERAPIST PROGRESS NOTE  Session Time: 45 minutes  Participation Level: Active  Behavioral Response: CasualAlertEuthymic  Type of Therapy: Individual Therapy  Treatment Goals addressed: Coping  Interventions: CBT and Supportive  Summary:  George [name] is a 30 y.o. male who presents for the scheduled session oriented x5, appropriately dressed, and friendly.  Client denied hallucinations and delusions. Client reported on today that since he was last seen things have been going well for him.  Client reported his new position at work is going very well.  Client reported during the holiday season where he gets hectic and it causes him to feel stressed but he feels that it is manageable.  Client reported he is also preparing to move to a new apartment because his new landlords are increasing the rent.  Client reported otherwise he was able to have a decent Thanksgiving holiday with his family.  Client reported he thinks that his dynamic with his parents is better because he has gained a sense of confidence in his life choices.  Client reported he no longer seeks to have approval from his parents over every little thing.  Client reported that he previously spoke with his mother about doing family therapy.  Client reported his mother was willing to do it but he feels nervous that other members of his family had not yet prepared to do so.  Client reported he has things that he would like to address about what has happened in the family over the years but will wait to see if his mother follows through on therapy.  Client reported that over the past few months working through "psychological" part which she believes has contributed to his decreased sexual libido.  Client reported he has considered it may be a medication side effect but thinks it is just a mental barrier that he has to work on overcoming.  Client reported otherwise he is responding well to the medication and is eating and sleeping  well.   Suicidal/Homicidal: Nowithout intent/plan  Therapist Response:  Therapist began the appointment asking the client how he has been doing since last seen. Therapist used CBT to utilize active listening and positive emotional support towards his thoughts and feelings. Therapist used CBT to ask the client to identify how he can tell he has made improvements in his communication and interpersonal relationships which wants caused him stress. Therapist used CBT to utilize empathy and acknowledgment of the application of skills taught. Therapist used CBT to assign the client homework to practice self care.      Plan: Return again in 5 weeks.  Diagnosis: Bipolar 1 disorder, mixed, full remission     Neena Rhymes Jazzie Trampe, LCSW 10/06/2021

## 2021-10-28 ENCOUNTER — Encounter (HOSPITAL_COMMUNITY): Payer: Self-pay | Admitting: Physician Assistant

## 2021-10-28 ENCOUNTER — Ambulatory Visit (INDEPENDENT_AMBULATORY_CARE_PROVIDER_SITE_OTHER): Payer: No Payment, Other | Admitting: Physician Assistant

## 2021-10-28 ENCOUNTER — Other Ambulatory Visit: Payer: Self-pay

## 2021-10-28 DIAGNOSIS — F419 Anxiety disorder, unspecified: Secondary | ICD-10-CM

## 2021-10-28 DIAGNOSIS — F3178 Bipolar disorder, in full remission, most recent episode mixed: Secondary | ICD-10-CM

## 2021-10-28 MED ORDER — HYDROXYZINE HCL 25 MG PO TABS: 25.0000 mg | ORAL_TABLET | Freq: Two times a day (BID) | ORAL | 2 refills | Status: DC | PRN

## 2021-10-28 MED ORDER — QUETIAPINE FUMARATE 200 MG PO TABS: 200.0000 mg | ORAL_TABLET | Freq: Every day | ORAL | 2 refills | Status: DC

## 2021-10-28 MED ORDER — OXCARBAZEPINE 600 MG PO TABS: 600.0000 mg | ORAL_TABLET | Freq: Two times a day (BID) | ORAL | 2 refills | Status: DC

## 2021-10-28 MED ORDER — BENZTROPINE MESYLATE 0.5 MG PO TABS: 0.5000 mg | ORAL_TABLET | Freq: Every day | ORAL | 2 refills | Status: DC

## 2021-10-28 NOTE — Progress Notes (Signed)
[facility] MD/PA/NP OP Progress Note  10/28/2021 2:46 PM George [name]  MRN:  [id number]  Chief Complaint:  Chief Complaint   Medication Management    HPI:   George [name] is a 30 year old male with a past psychiatric history significant for bipolar 1 disorder and anxiety who presents to [facility] for follow-up and medication management.  Patient is currently being managed on the following medications:  Seroquel 200 mg at bedtime Oxcarbazepine 600 mg 2 times daily Benztropine 0.5 mg at bedtime Hydroxyzine 25 mg 2 times daily as needed  Patient reports no issues or concerns regarding his current medication regimen.  Patient denies the need for dosage adjustments at this time and is requesting refills on all his medications following the conclusion of the encounter.  Patient reports that he still been dealing with sexual dysfunction and is unsure if it is due to his current medication regimen.  Patient is not interested in adding a new medication to help with managing his issue.  He does state that whenever he drinks alcohol it helps the issue go away.  Patient believes that his issues may stem from performance anxiety and is interested in discussing his issues with his therapist.  Patient denies depressive episodes stating that things are going well.  He reports that he normally would have anxiety over the holidays but states that work and his family have been good.  He does endorse occasionally having a hard day.  Patient has no other issues or concerns at this time.  Patient is alert and oriented x4, pleasant, calm, cooperative, and fully engaged in conversation during the encounter.  Patient endorses good mood.  Patient denies suicidal or homicidal ideation.  He further denies auditory or visual hallucinations and does not appear to be responding to internal/external stimuli.  Patient endorses good sleep and receives on average 6 to 8 hours of sleep  each night.  Patient endorses good appetite and eats on average 2 meals and a snack in the middle of the day.  Patient endorses alcohol consumption occasionally.  Patient denies tobacco use.  Patient endorses illicit drug use in the form of marijuana.  Visit Diagnosis:    ICD-10-CM   1. Bipolar 1 disorder, mixed, full remission (HCC)  F31.78 benztropine (COGENTIN) 0.5 MG tablet    QUEtiapine (SEROQUEL) 200 MG tablet    oxcarbazepine (TRILEPTAL) 600 MG tablet    2. Anxiety  F41.9 hydrOXYzine (ATARAX) 25 MG tablet      Past Psychiatric History:  Bipolar 1 disorder   History of several emergency room visits as well as psychiatry hospitalizations in the past.  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    Past Surgical History:  Procedure Laterality Date   NO PAST SURGERIES     ORIF ANKLE FRACTURE Right 01/31/2017   Procedure: OPEN REDUCTION INTERNAL FIXATION (ORIF) RIGHT TRIMALLEOLAR ANKLE FRACTURE;  Surgeon: [name], MD;  Location: [facility] OR;  Service: Orthopedics;  Laterality: Right;    Family Psychiatric History:  No reported family history of psychiatric illness  Family History:  Family History  Problem Relation Age of Onset   Cancer Father    Diabetes Maternal Grandmother    Alzheimer's disease Paternal Grandmother    Heart failure  Paternal Grandmother    Heart failure Paternal Grandfather    Heart failure Maternal Grandfather     Social History:  Social History   Socioeconomic History   Marital status: Single    Spouse name: Not on file   Number of children: Not on file   Years of education: Not on file   Highest education level: Not on file  Occupational History   Not on file  Tobacco Use   Smoking status: Every Day    Packs/day: 1.00    Years: 10.00    Pack years:  10.00    Types: Cigarettes   Smokeless tobacco: Never   Tobacco comments:    off and on  Substance and Sexual Activity   Alcohol use: Yes   Drug use: Yes    Types: Marijuana, Cocaine    Comment: heroin   Sexual activity: Not on file    Comment: Unknown  Other Topics Concern   Not on file  Social History Narrative   Not on file   Social Determinants of Health   Financial Resource Strain: Not on file  Food Insecurity: Not on file  Transportation Needs: Not on file  Physical Activity: Not on file  Stress: Not on file  Social Connections: Not on file    Allergies:  Allergies  Allergen Reactions   Amoxicillin Swelling and Rash    "throat started to close up" Has patient had a PCN reaction causing immediate rash, facial/tongue/throat swelling, SOB or lightheadedness with hypotension: Yes Has patient had a PCN reaction causing severe rash involving mucus membranes or skin necrosis: Yes Has patient had a PCN reaction that required hospitalization No Has patient had a PCN reaction occurring within the last 10 years: No If all of the above answers are "NO", then may proceed with Cephalosporin use.    Penicillins Rash and Other (See Comments)    Has patient had a PCN reaction causing immediate rash, facial/tongue/throat swelling, SOB or lightheadedness with hypotension: unsure Has patient had a PCN reaction causing severe rash involving mucus membranes or skin necrosis: Unsure Has patient had a PCN reaction that required hospitalization Unsure Has patient had a PCN reaction occurring within the last 10 years: No If all of the above answers are "NO", then may proceed with Cephalosporin use.    Metabolic Disorder Labs: Lab Results  Component Value Date   HGBA1C 6.2 (H) 12/10/2015   MPG 131 12/10/2015   Lab Results  Component Value Date   PROLACTIN 28.2 (H) 12/10/2015   Lab Results  Component Value Date   CHOL 119 12/10/2015   TRIG 91 12/10/2015   HDL 34 (L) 12/10/2015    CHOLHDL 3.5 12/10/2015   VLDL 18 12/10/2015   LDLCALC 67 12/10/2015   Lab Results  Component Value Date   TSH 1.744 12/10/2015    Therapeutic Level Labs: No results found for: LITHIUM No results found for: VALPROATE No components found for:  CBMZ  Current Medications: Current Outpatient Medications  Medication Sig Dispense Refill   benztropine (COGENTIN) 0.5 MG tablet Take 1 tablet (0.5 mg total) by mouth at bedtime. 30 tablet 2   hydrOXYzine (ATARAX) 25 MG tablet Take 1 tablet (25 mg total) by mouth 2 (two) times daily as needed for anxiety. 60 tablet 2   oxcarbazepine (TRILEPTAL) 600 MG tablet Take 1 tablet (600 mg total) by mouth 2 (two) times daily. 60 tablet 2   QUEtiapine (SEROQUEL) 200 MG tablet Take 1 tablet (200 mg total) by mouth  at bedtime. 30 tablet 2   No current facility-administered medications for this visit.     Musculoskeletal: Strength & Muscle Tone: within normal limits Gait & Station: normal Patient leans: N/A  Psychiatric Specialty Exam: Review of Systems  Psychiatric/Behavioral:  Negative for decreased concentration, dysphoric mood, hallucinations, self-injury, sleep disturbance and suicidal ideas. The patient is not nervous/anxious and is not hyperactive.    Blood pressure 126/77, pulse (!) 59, height 6\' 1"  (1.854 m), weight 240 lb (108.9 kg), SpO2 98 %.Body mass index is 31.66 kg/m.  General Appearance: Casual  Eye Contact:  Good  Speech:  Clear and Coherent and Normal Rate  Volume:  Normal  Mood:  Euthymic  Affect:  Appropriate  Thought Process:  Coherent and Descriptions of Associations: Intact  Orientation:  Full (Time, Place, and Person)  Thought Content: WDL   Suicidal Thoughts:  No  Homicidal Thoughts:  No  Memory:  Immediate;   Good Recent;   Good Remote;   Good  Judgement:  Good  Insight:  Good  Psychomotor Activity:  Normal  Concentration:  Concentration: Good and Attention Span: Good  Recall:  Good  Fund of Knowledge: Good   Language: Good  Akathisia:  NA  Handed:  Right  AIMS (if indicated): not done  Assets:  Communication Skills Desire for Improvement Financial Resources/Insurance Housing Social Support Transportation Vocational/Educational  ADL's:  Intact  Cognition: WNL  Sleep:  Good   Screenings: AIMS    Flowsheet Row [facility]  AIMS Total Score 0      AUDIT    Flowsheet Row [facility]  Alcohol Use Disorder Identification Test Final Score (AUDIT) 0      GAD-7    Flowsheet Row Office Visit from 10/28/2021 in [facility] Office Visit from 08/09/2021 in [facility] Counselor from 02/15/2021 in [facility]  Total GAD-7 Score 0 0 2      PHQ2-9    Flowsheet Row Office Visit from 10/28/2021 in [facility] Office Visit from 08/09/2021 in [facility] Counselor from 02/15/2021 in [facility]  PHQ-2 Total Score 0 0 0  PHQ-9 Total Score -- -- 0      Flowsheet Row Office Visit from 10/28/2021 in [facility] Office Visit from 08/09/2021 in [facility]  C-SSRS RISK CATEGORY No Risk No Risk        Assessment and Plan:   George [name] is a 30 year old male with a past psychiatric history significant for bipolar 1 disorder and anxiety who presents to [facility] for follow-up and medication management.  Patient reports that he continues to have some sexual dysfunction that he thought may be due to his current medication regimen.  Patient denies wanting to add on any medication to help with this issue and is agreeable to talking things over with this therapist to determine  recalls.  Patient denies any issues or concerns with his current medication regimen.  Patient denies the need for dosage adjustments at this time and is requesting refills following the conclusion of the encounter.  Patient's medications to be e- prescribed to pharmacy of choice.  1. Bipolar 1 disorder, mixed, full remission (HCC)  - benztropine (COGENTIN) 0.5 MG tablet; Take 1 tablet (0.5 mg total) by  mouth at bedtime.  Dispense: 30 tablet; Refill: 2 - QUEtiapine (SEROQUEL) 200 MG tablet; Take 1 tablet (200 mg total) by mouth at bedtime.  Dispense: 30 tablet; Refill: 2 - oxcarbazepine (TRILEPTAL) 600 MG tablet; Take 1 tablet (600 mg total) by mouth 2 (two) times daily.  Dispense: 60 tablet; Refill: 2  2. Anxiety  - hydrOXYzine (ATARAX) 25 MG tablet; Take 1 tablet (25 mg total) by mouth 2 (two) times daily as needed for anxiety.  Dispense: 60 tablet; Refill: 2  Patient to follow up in 2 months Provider spent a total of 16 minutes with the patient/reviewing the patient's chart  Meta Hatchet, PA 10/28/2021, 2:46 PM

## 2021-11-22 ENCOUNTER — Encounter (HOSPITAL_COMMUNITY): Payer: No Payment, Other | Admitting: Physician Assistant

## 2021-11-29 ENCOUNTER — Ambulatory Visit (INDEPENDENT_AMBULATORY_CARE_PROVIDER_SITE_OTHER): Payer: No Payment, Other | Admitting: Clinical

## 2021-11-29 ENCOUNTER — Other Ambulatory Visit: Payer: Self-pay

## 2021-11-29 DIAGNOSIS — F3178 Bipolar disorder, in full remission, most recent episode mixed: Secondary | ICD-10-CM | POA: Diagnosis not present

## 2021-12-03 NOTE — Plan of Care (Signed)
Client was in agreement with the plan. °

## 2021-12-03 NOTE — Progress Notes (Signed)
° °  THERAPIST PROGRESS NOTE  Session Time: 45 minutes  Participation Level: Active  Behavioral Response: CasualAlertEuthymic  Type of Therapy: Individual Therapy  Treatment Goals addressed: Coping  Interventions: CBT and Supportive  Summary:  George [name] is a 31 y.o. male who presents for the scheduled session oriented times five, appropriately dressed, and friendly. Client denied hallucinations and delusions. Client reported on today he is doing very well. Client reported work has been going well and his social and family life. Client reported she did have one miscommunication with his mother via text message. Client reported his mother texted him while he was at work. Client reported the message said something of "I know you got a girlfriend but you're not married yet I'd like to hear from my son". Client reported knowing her he did not receive it in a good tone which initially upset him but thinking about after thinking about says she was trying to communicate that she misses him. Client reported otherwise he has been thinking about his relationship. Client reported he is happy with it but knows with her having children there is potential he could be a father figure to the kids. Client reported he has been thinking about the course of the relationship but has admitted times when he wanted to leave but he didn't feel ready. Client reported he will keep open communication with her about his feelings of the relationship. Client reported he spoke with the psychiatrist and is going to try natural vitamins to help with his libido. Client reported he is eat and sleeping well.    Suicidal/Homicidal: Nowithout intent/plan  Therapist Response:  Therapist began the appointment asking the client how he has been doing. Therapist used CBT to engage and utilize active listening and positive emotional support. Therapist used CBT to engage and have the client to describe the challenging situations  that brought on negative emotions. Therapist used CBT to engage the client in reframing and discussing communication skills. Therapist used CBT to positive reinforce the clients use of taught skills. Client assigned the client homework to continue practicing the use of communication skills and boundaries. Client was scheduled for next appointment.     Plan: Return again in 5 weeks.  Diagnosis: Bipolar 1 disorder, mixed, full remission    George Rhymes Elin Fenley, LCSW 11/29/2021

## 2021-12-29 ENCOUNTER — Encounter (HOSPITAL_COMMUNITY): Payer: No Payment, Other | Admitting: Physician Assistant

## 2021-12-30 ENCOUNTER — Encounter (HOSPITAL_COMMUNITY): Payer: Self-pay | Admitting: Physician Assistant

## 2021-12-30 ENCOUNTER — Telehealth (INDEPENDENT_AMBULATORY_CARE_PROVIDER_SITE_OTHER): Payer: No Payment, Other | Admitting: Physician Assistant

## 2021-12-30 DIAGNOSIS — F3178 Bipolar disorder, in full remission, most recent episode mixed: Secondary | ICD-10-CM | POA: Diagnosis not present

## 2021-12-30 DIAGNOSIS — F419 Anxiety disorder, unspecified: Secondary | ICD-10-CM

## 2021-12-30 NOTE — Progress Notes (Signed)
BH MD/PA/NP OP Progress Note  Virtual Visit via Telephone Note  I connected with George [name] on 12/30/21 at  8:00 AM EST by telephone and verified that I am speaking with the correct person using two identifiers.  Location: Patient: Home Provider: Clinic   I discussed the limitations, risks, security and privacy concerns of performing an evaluation and management service by telephone and the availability of in person appointments. I also discussed with the patient that there may be a patient responsible charge related to this service. The patient expressed understanding and agreed to proceed.  Follow Up Instructions:  I discussed the assessment and treatment plan with the patient. The patient was provided an opportunity to ask questions and all were answered. The patient agreed with the plan and demonstrated an understanding of the instructions.   The patient was advised to call back or seek an in-person evaluation if the symptoms worsen or if the condition fails to improve as anticipated.  I provided 12 minutes of non-face-to-face time during this encounter.  Meta Hatchet, PA    12/30/2021 8:26 AM George [name]  MRN:  [id number]  Chief Complaint:  Chief Complaint  Patient presents with   Follow-up   Medication Management   HPI:   George [name] is a 31 year old male with a past psychiatric history significant for bipolar 1 disorder and anxiety who presents to [facility] via virtual telephone visit for follow-up and medication management.  Patient is currently being managed on the following medications:  Benztropine 0.5 mg at bedtime Seroquel 200 mg at bedtime Oxcarbazepine (Trileptal) 600 mg 2 times daily Hydroxyzine 25 mg 2 times daily as needed  Patient reports no issues or concerns regarding his current medication regimen.  Patient denies the need for dosage adjustments at this time and is requesting refills on all of  his medications following the conclusion of the encounter.  Patient reports that his uncle recently passed.  Prior to his passing, patient states that his uncle spent his final moments under hospice care.  Patient reports that upon hearing about his uncle's death, he was very upset; however, he denies experiencing major shifts in his mood.  Patient reports that he has been able to function relatively normally and has been able to go to work.  Patient denies current depressive symptoms and denied experiencing depression when hearing about his uncle's passing.  Patient states that his anxiety has been manageable and reports that he has been able to work without experiencing any crippling anxiety.  Patient denies any new stressors and states that he has been doing his best to not internalize his issues.  In addition to his medications, patient states that he has been also taking healthcare supplements such as ginseng.  Provider informed patient to be weary of supplements that have not been FDA approved.  Patient is alert and oriented x4, calm, cooperative, and fully engaged in conversation during the encounter.  Patient endorses good mood.  Patient denies suicidal or homicidal ideations.  He further denies auditory or visual hallucinations and does not appear to be responding to internal/external stimuli.  Patient endorses good sleep and receives on average 6 to 8 hours of sleep each night.  Patient endorses good appetite and eats on average 2 meals along with a snack each day.  He reports that his meals are normally light.  Patient endorses alcohol consumption occasionally.  Patient denies tobacco use and illicit drug use.  Visit Diagnosis:  ICD-10-CM   1. Bipolar 1 disorder, mixed, full remission (HCC)  F31.78 benztropine (COGENTIN) 0.5 MG tablet    QUEtiapine (SEROQUEL) 200 MG tablet    oxcarbazepine (TRILEPTAL) 600 MG tablet    2. Anxiety  F41.9 hydrOXYzine (ATARAX) 25 MG tablet      Past  Psychiatric History:  Bipolar 1 disorder   History of several emergency room visits as well as psychiatry hospitalizations in the past.  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    Past Surgical History:  Procedure Laterality Date   NO PAST SURGERIES     ORIF ANKLE FRACTURE Right 01/31/2017   Procedure: OPEN REDUCTION INTERNAL FIXATION (ORIF) RIGHT TRIMALLEOLAR ANKLE FRACTURE;  Surgeon: Tarry KosNaiping M Xu, MD;  Location: [facility] OR;  Service: Orthopedics;  Laterality: Right;    Family Psychiatric History:  No reported family history of psychiatric illness  Family History:  Family History  Problem Relation Age of Onset   Cancer Father    Diabetes Maternal Grandmother    Alzheimer's disease Paternal Grandmother    Heart failure Paternal Grandmother    Heart failure Paternal Grandfather    Heart failure Maternal Grandfather     Social History:  Social History   Socioeconomic History   Marital status: Single    Spouse name: Not on file   Number of children: Not on file   Years of education: Not on file   Highest education level: Not on file  Occupational History   Not on file  Tobacco Use   Smoking status: Every Day    Packs/day: 1.00    Years: 10.00    Pack years: 10.00    Types: Cigarettes   Smokeless tobacco: Never   Tobacco comments:    off and on  Substance and Sexual Activity   Alcohol use: Yes   Drug use: Yes    Types: Marijuana, Cocaine    Comment: heroin   Sexual activity: Not on file    Comment: Unknown  Other Topics Concern   Not on file  Social History Narrative   Not on file   Social Determinants of Health   Financial Resource Strain: Not on file  Food Insecurity: Not on file  Transportation Needs: Not on file  Physical Activity: Not on file  Stress: Not on  file  Social Connections: Not on file    Allergies:  Allergies  Allergen Reactions   Amoxicillin Swelling and Rash    "throat started to close up" Has patient had a PCN reaction causing immediate rash, facial/tongue/throat swelling, SOB or lightheadedness with hypotension: Yes Has patient had a PCN reaction causing severe rash involving mucus membranes or skin necrosis: Yes Has patient had a PCN reaction that required hospitalization No Has patient had a PCN reaction occurring within the last 10 years: No If all of the above answers are "NO", then may proceed with Cephalosporin use.    Penicillins Rash and Other (See Comments)    Has patient had a PCN reaction causing immediate rash, facial/tongue/throat swelling, SOB or lightheadedness with hypotension: unsure Has patient had a PCN reaction causing severe rash involving mucus membranes or skin necrosis: Unsure Has patient had a PCN reaction that required hospitalization Unsure Has patient had a PCN reaction occurring within the last 10  years: No If all of the above answers are "NO", then may proceed with Cephalosporin use.    Metabolic Disorder Labs: Lab Results  Component Value Date   HGBA1C 6.2 (H) 12/10/2015   MPG 131 12/10/2015   Lab Results  Component Value Date   PROLACTIN 28.2 (H) 12/10/2015   Lab Results  Component Value Date   CHOL 119 12/10/2015   TRIG 91 12/10/2015   HDL 34 (L) 12/10/2015   CHOLHDL 3.5 12/10/2015   VLDL 18 12/10/2015   LDLCALC 67 12/10/2015   Lab Results  Component Value Date   TSH 1.744 12/10/2015    Therapeutic Level Labs: No results found for: LITHIUM No results found for: VALPROATE No components found for:  CBMZ  Current Medications: Current Outpatient Medications  Medication Sig Dispense Refill   benztropine (COGENTIN) 0.5 MG tablet Take 1 tablet (0.5 mg total) by mouth at bedtime. 30 tablet 2   hydrOXYzine (ATARAX) 25 MG tablet Take 1 tablet (25 mg total) by mouth 2 (two)  times daily as needed for anxiety. 60 tablet 2   oxcarbazepine (TRILEPTAL) 600 MG tablet Take 1 tablet (600 mg total) by mouth 2 (two) times daily. 60 tablet 2   QUEtiapine (SEROQUEL) 200 MG tablet Take 1 tablet (200 mg total) by mouth at bedtime. 30 tablet 2   No current facility-administered medications for this visit.     Musculoskeletal: Strength & Muscle Tone: Unable to assess due to telemedicine visit Gait & Station: Unable to assess due to telemedicine visit Patient leans: Unable to assess due to telemedicine visit  Psychiatric Specialty Exam: Review of Systems  Psychiatric/Behavioral:  Negative for decreased concentration, dysphoric mood, hallucinations, self-injury, sleep disturbance and suicidal ideas. The patient is not nervous/anxious and is not hyperactive.    There were no vitals taken for this visit.There is no height or weight on file to calculate BMI.  General Appearance: Unable to assess due to telemedicine visit  Eye Contact:  Unable to assess due to telemedicine visit  Speech:  Clear and Coherent and Normal Rate  Volume:  Normal  Mood:  Euthymic  Affect:  Appropriate  Thought Process:  Coherent and Descriptions of Associations: Intact  Orientation:  Full (Time, Place, and Person)  Thought Content: WDL   Suicidal Thoughts:  No  Homicidal Thoughts:  No  Memory:  Immediate;   Good Recent;   Good Remote;   Good  Judgement:  Good  Insight:  Good  Psychomotor Activity:  Normal  Concentration:  Concentration: Good and Attention Span: Good  Recall:  Good  Fund of Knowledge: Good  Language: Good  Akathisia:  No  Handed:  Right  AIMS (if indicated): not done  Assets:  Communication Skills Desire for Improvement Financial Resources/Insurance Housing Social Support Transportation Vocational/Educational  ADL's:  Intact  Cognition: WNL  Sleep:  Good   Screenings: AIMS    Flowsheet Row [facility]  AIMS Total Score 0      AUDIT    Flowsheet Row [facility]  Alcohol Use Disorder Identification Test Final Score (AUDIT) 0      GAD-7    Flowsheet Row Video Visit from 12/30/2021 in [facility] Office Visit from 10/28/2021 in [facility] Office Visit from 08/09/2021 in [facility] Counselor from 02/15/2021 in [facility]  Total GAD-7 Score  0 0 0 2      PHQ2-9    Flowsheet Row Video Visit from 12/30/2021 in [facility] Office Visit from 10/28/2021 in [facility] Office Visit from 08/09/2021 in [facility] Counselor from 02/15/2021 in [facility]  PHQ-2 Total Score 0 0 0 0  PHQ-9 Total Score -- -- -- 0      Flowsheet Row Video Visit from 12/30/2021 in [facility] Office Visit from 10/28/2021 in [facility] Office Visit from 08/09/2021 in [facility]  C-SSRS RISK CATEGORY No Risk No Risk No Risk        Assessment and Plan:   George [name] is a 31 year old male with a past psychiatric history significant for bipolar 1 disorder and anxiety who presents to [facility] via virtual telephone visit for follow-up and medication management.  Despite the recent passing of his uncle, patient denies major depressive symptoms and states that his anxiety has been manageable.  Patient consents that he is able to complete his activities of daily living without issue and has been taking his medications as prescribed.  Patient is requesting refills on all his medications following the conclusion of the encounter.  Patient's medications to be e-prescribed to  pharmacy of choice.  Collaboration of Care: Collaboration of Care: Medication Management AEB provider is managing patient's psychiatric medications, Psychiatrist AEB patient being followed by this mental health provider, and Referral or follow-up with counselor/therapist AEB patient being seen for counseling and therapy by a licensed clinical social worker at this facility  Patient/Guardian was advised Release of Information must be obtained prior to any record release in order to collaborate their care with an outside provider. Patient/Guardian was advised if they have not already done so to contact the registration department to sign all necessary forms in order for [place] to release information regarding their care.   Consent: Patient/Guardian gives verbal consent for treatment and assignment of benefits for services provided during this visit. Patient/Guardian expressed understanding and agreed to proceed.   1. Bipolar 1 disorder, mixed, full remission (HCC)  - benztropine (COGENTIN) 0.5 MG tablet; Take 1 tablet (0.5 mg total) by mouth at bedtime.  Dispense: 30 tablet; Refill: 2 - QUEtiapine (SEROQUEL) 200 MG tablet; Take 1 tablet (200 mg total) by mouth at bedtime.  Dispense: 30 tablet; Refill: 2 - oxcarbazepine (TRILEPTAL) 600 MG tablet; Take 1 tablet (600 mg total) by mouth 2 (two) times daily.  Dispense: 60 tablet; Refill: 2  2. Anxiety  - hydrOXYzine (ATARAX) 25 MG tablet; Take 1 tablet (25 mg total) by mouth 2 (two) times daily as needed for anxiety.  Dispense: 60 tablet; Refill: 2  Patient to follow up in 3 months Provider spent a total of 12 minutes with the patient /reviewing patient's chart  Meta Hatchet, PA 12/30/2021, 8:26 AM

## 2021-12-31 MED ORDER — OXCARBAZEPINE 600 MG PO TABS: 600.0000 mg | ORAL_TABLET | Freq: Two times a day (BID) | ORAL | 2 refills | Status: DC

## 2021-12-31 MED ORDER — BENZTROPINE MESYLATE 0.5 MG PO TABS: 0.5000 mg | ORAL_TABLET | Freq: Every day | ORAL | 2 refills | Status: DC

## 2021-12-31 MED ORDER — HYDROXYZINE HCL 25 MG PO TABS: 25.0000 mg | ORAL_TABLET | Freq: Two times a day (BID) | ORAL | 2 refills | Status: DC | PRN

## 2021-12-31 MED ORDER — QUETIAPINE FUMARATE 200 MG PO TABS: 200.0000 mg | ORAL_TABLET | Freq: Every day | ORAL | 2 refills | Status: DC

## 2022-01-31 ENCOUNTER — Other Ambulatory Visit: Payer: Self-pay

## 2022-01-31 ENCOUNTER — Ambulatory Visit (INDEPENDENT_AMBULATORY_CARE_PROVIDER_SITE_OTHER): Payer: No Payment, Other | Admitting: Clinical

## 2022-01-31 DIAGNOSIS — F3178 Bipolar disorder, in full remission, most recent episode mixed: Secondary | ICD-10-CM

## 2022-02-01 ENCOUNTER — Encounter (HOSPITAL_COMMUNITY): Payer: Self-pay

## 2022-02-01 NOTE — Plan of Care (Signed)
CLIENT IS IN AGREEMENT TO THE PLAN. ?

## 2022-02-01 NOTE — Progress Notes (Signed)
? ?  THERAPIST PROGRESS NOTE ? ?Session Time: 45 minutes ? ?Participation Level: Active ? ?Behavioral Response: CasualAlertEuthymic ? ?Type of Therapy: Individual Therapy ? ?Treatment Goals addressed: identify cognitive patterns and beliefs that interfere with therapy once per session ? ?ProgressTowards Goals: Progressing ? ?Interventions: CBT and Supportive ? ?Summary:  ?George [name] is a 31 y.o. male who presents for the scheduled session oriented times five, appropriately dressed, and friendly. Client denied hallucinations and delusions.  ?Client reported on today he is doing well. Client reported he has been presented with stressors that have been indirectly affecting him through people close to him in interpersonal relationships. Client reported he has been thinking about how to find his place in supporting and not getting too involved with his best friend and girlfriends personal problems. Client reported feeling guilty because thinking about how withdrawn his thoughts of detachment can make him feel from the person. Client discussed with the therapist how boundaries work and having empathy for others in stressful situations. Client reported he has had guilt because his friend has been stuck in a cycle of destructive behaviors and at times feel like he has left his friend behind. Client reported he understands that when you begin to progress in life people will be upset. Client reported he has also been thinking about his sisters graduation coming up and initially was deciding to not support because all the events she has leading up to it is exhausting. Client reported he also admits to having feelings of comparison seeing the success his sister has a seemingly bigger level. Client engaged with the therapist about identifying his strengths and acknowledging the significance of his own growth. ?Evidence of progress towards goal:  Client reported he has made progress with problem solving skills 7 out of 7  days a week and effectively asserting his boundaries. Client reported he is aware of previous thoughts of feeling defensive which have affected his perspective of interaction with others. ? ? ?Suicidal/Homicidal: Nowithout intent/plan ? ?Therapist Response:  ?Therapist began the appointment asking how he has been doing since last seen. ?Therapist used CBT to engage and using active listening and positive emotional support. ?Therapist used CBT to engage and ask the client about situations that have been challenging for him. ?Therapist used CBT to engage and ask him to identify his thought processing and brainstormed solutions for challenges. ?Therapist used CBT to teach about boundaries,empathy, and assertive communication. ?Therapist used CBT ask the client to identify his progress with frequency of use with coping skills with continued practice in his daily activity.    ?Therapist assigned the client to practice empathy and self care. ?Client was scheduled for next appointment. ? ? ? ?Plan: Return again in 5 weeks. ? ?Diagnosis: bipolar 1 disorder, mixed, full remission ? ?Collaboration of Care: Patient refused AEB none requested at this time by the client. ? ?Patient/Guardian was advised Release of Information must be obtained prior to any record release in order to collaborate their care with an outside provider. Patient/Guardian was advised if they have not already done so to contact the registration department to sign all necessary forms in order for [place] to release information regarding their care.  ? ?Consent: Patient/Guardian gives verbal consent for treatment and assignment of benefits for services provided during this visit. Patient/Guardian expressed understanding and agreed to proceed.  ? ?Neena Rhymes Jebadiah Imperato, LCSW ?01/31/2022 ? ?

## 2022-02-21 ENCOUNTER — Ambulatory Visit (INDEPENDENT_AMBULATORY_CARE_PROVIDER_SITE_OTHER): Payer: No Payment, Other | Admitting: Clinical

## 2022-02-21 DIAGNOSIS — F3178 Bipolar disorder, in full remission, most recent episode mixed: Secondary | ICD-10-CM | POA: Diagnosis not present

## 2022-02-24 NOTE — Progress Notes (Signed)
? ?  THERAPIST PROGRESS NOTE ? ?Session Time: 45 minutes ? ?Participation Level: Active ? ?Behavioral Response: CasualAlertEuthymic ? ?Type of Therapy: Individual Therapy ? ?Treatment Goals addressed: client will identify cognitive patterns and beliefs that interfere with therapy once per session ? ?ProgressTowards Goals: Progressing ? ?Interventions: CBT and Supportive ? ?Summary:  ?George [name] is a 31 y.o. male who presents for the scheduled session oriented times five, appropriately dressed, and friendly. Client denied hallucinations and delusions. ?Client reported he is doing well today. Client reported he recently celebrated his birthday with family and friends which was a good time. Client reported he has had some conflict in his relationship. Client reported he and his girlfriend had a disagreement about what "taking space" means. Client reported their argument seems to be over minor misunderstandings. Client reported he cares about her but she does not make assertive decisions in the relationship or things she wants to do. Client reported she tends to just go along with what he decides to do even with asking for her input. Client reported her lack of input in that aspect makes him concerned his decisions come across of manipulative. Client reported he has a hard time trusting himself trying to come up with options and not getting feedback. Client reported otherwise things are going well at work and with his family.  ?Evidence of progress towards goal:  client reported he is able to practice problem solving skills by considering the pros and cons of his decisions at least 1x per week or as a stressor arises. ? ? ?Suicidal/Homicidal: Nowithout intent/plan ? ?Therapist Response:  ?Therapist began the appointment asking the client how he has been doing since last seen. ?Therapist used CBT to engage using active listening and positive emotional support.  ?Therapist used CBT to ask the client to describe  stressor related to his worry. ?Therapist used CBT to discuss positive conflict resolution skill and communication skills. ?Therapist used CBT ask the client to identify his progress with frequency of use with coping skills with continued practice in his daily activity.    ?Therapist assigned the client homework to practice self care. ?Client was scheduled for next appointment. ? ? ?Plan: Return again in 5 weeks. ? ?Diagnosis: bipolar 1 disorder, mixed, full remission ? ?Collaboration of Care: Patient refused AEB none requested by the client at this time. ? ?Patient/Guardian was advised Release of Information must be obtained prior to any record release in order to collaborate their care with an outside provider. Patient/Guardian was advised if they have not already done so to contact the registration department to sign all necessary forms in order for [place] to release information regarding their care.  ? ?Consent: Patient/Guardian gives verbal consent for treatment and assignment of benefits for services provided during this visit. Patient/Guardian expressed understanding and agreed to proceed.  ? ?Neena Rhymes Reace Breshears, LCSW ?02/21/2022 ? ?

## 2022-03-14 ENCOUNTER — Ambulatory Visit (INDEPENDENT_AMBULATORY_CARE_PROVIDER_SITE_OTHER): Payer: No Payment, Other | Admitting: Clinical

## 2022-03-14 DIAGNOSIS — F3178 Bipolar disorder, in full remission, most recent episode mixed: Secondary | ICD-10-CM

## 2022-03-19 NOTE — Progress Notes (Signed)
? ?  THERAPIST PROGRESS NOTE ? ?Session Time: 45 minutes ? ?Participation Level: Active ? ?Behavioral Response: CasualAlertEuthymic ? ?Type of Therapy: Individual Therapy ? ?Treatment Goals addressed: client will identify cognitive patterns and beliefs that interfere with therapy ? ?ProgressTowards Goals: Progressing ? ?Interventions: CBT and Supportive ? ?Summary:  ?MARVON SHILLINGBURG is a 31 y.o. male who presents for the scheduled session oriented x5, appropriately dressed, and friendly.  Client denied hallucinations and delusions. ?Client reported on today he is doing well.  Client reported since he was last seen he and his friend went out to a music festival to celebrate the friend's birthday.  Client reported at the festival they took shrooms.  Client reported although he had the shrooms his friend insisted that he takes him too.  Client reported after his friend partook in taking some he started to freak out.  Client reported that he had to leave the rent because his friends anxiety was really high.  Client reported his friend has been working on sobriety which contributed to why he felt bad about the whole situation.  Client reported he has not talked to his friend since that time and does not know what he would say to him.  Client reported otherwise things are going well with his family and he is still planning to attend his sister's graduation events.  Client reported work has been going well for him also. ?Evidence of progress towards goal:  Client reported he is using 1 skill 7 out of 7 days per week which is positive reframing and communication. ? ? ?Suicidal/Homicidal: Nowithout intent/plan ? ?Therapist Response:  ?Therapist began the appointment asking the client how he has been doing since last seen. ?Therapist used CBT to engage using active listening and positive emotional support towards her thoughts and feelings. ?Therapist used CBT to ask the client to discuss the stressor situation that caused him  to have negative thoughts and feelings. ?Therapist used CBT to discuss how to be positive support for himself and others for using appropriate boundaries. ?Therapist used CBT to reinforce the cessation of using illicit substances. ?Therapist used CBT to acknowledge the clients efforts to problem solve situations. ?Therapist used CBT ask the client to identify his progress with frequency of use with coping skills with continued practice in his daily activity.   ?Therapist assigned client homework to practice self-care. ?Client was scheduled for next appointment. ? ? ?Plan: Return again in 5 weeks. ? ?Diagnosis: Bipolar 1 disorder, mixed, full remission ? ?Collaboration of Care: Patient refused AEB none requested by the client. ? ?Patient/Guardian was advised Release of Information must be obtained prior to any record release in order to collaborate their care with an outside provider. Patient/Guardian was advised if they have not already done so to contact the registration department to sign all necessary forms in order for [place] to release information regarding their care.  ? ?Consent: Patient/Guardian gives verbal consent for treatment and assignment of benefits for services provided during this visit. Patient/Guardian expressed understanding and agreed to proceed.  ? ?Neena Rhymes Hildreth Robart, LCSW ?03/14/2022 ? ?

## 2022-03-19 NOTE — Plan of Care (Signed)
?  Problem: Bipolar Disorder CCP Problem  1  Goal: STG: Tavaris WILL IDENTIFY COGNITIVE PATTERNS AND BELIEFS THAT INTERFERE WITH THERAPY ONCE PER SESSION Outcome: Progressing   

## 2022-03-29 ENCOUNTER — Ambulatory Visit (INDEPENDENT_AMBULATORY_CARE_PROVIDER_SITE_OTHER): Payer: No Payment, Other | Admitting: Physician Assistant

## 2022-03-29 ENCOUNTER — Encounter (HOSPITAL_COMMUNITY): Payer: Self-pay | Admitting: Physician Assistant

## 2022-03-29 DIAGNOSIS — F3178 Bipolar disorder, in full remission, most recent episode mixed: Secondary | ICD-10-CM

## 2022-03-29 DIAGNOSIS — F419 Anxiety disorder, unspecified: Secondary | ICD-10-CM | POA: Diagnosis not present

## 2022-03-31 ENCOUNTER — Encounter (HOSPITAL_COMMUNITY): Payer: Self-pay | Admitting: Physician Assistant

## 2022-03-31 MED ORDER — OXCARBAZEPINE 600 MG PO TABS: 600.0000 mg | ORAL_TABLET | Freq: Two times a day (BID) | ORAL | 2 refills | Status: DC

## 2022-03-31 MED ORDER — BENZTROPINE MESYLATE 0.5 MG PO TABS: 0.5000 mg | ORAL_TABLET | Freq: Every day | ORAL | 2 refills | Status: DC

## 2022-03-31 MED ORDER — QUETIAPINE FUMARATE 200 MG PO TABS: 200.0000 mg | ORAL_TABLET | Freq: Every day | ORAL | 2 refills | Status: DC

## 2022-03-31 NOTE — Progress Notes (Unsigned)
BH MD/PA/NP OP Progress Note  03/29/2022 11:30 AM PRATHER FAILLA  MRN:  [id number]  Chief Complaint:  Chief Complaint  Patient presents with   Medication Management   HPI:   George [name] is a 31 year old male with a past psychiatric history significant for bipolar 1 disorder and anxiety who presents to [facility] for follow-up and medication management.  Patient is currently being managed on the following medications:  Benztropine 0.5 mg at bedtime Seroquel 200 mg at bedtime Oxcarbazepine (Trileptal) 600 mg 2 times daily Hydroxyzine 25 mg 2 times daily as needed  Patient reports no issues or concerns regarding his current medication regimen.  Patient denies experiencing any adverse side effects from his current prescriptions.  He denies depression stating that he has not been experiencing any abnormal shifts in his mood outside of his normal emotions.  He states that through the use of his medications, he is able to feel things without going out of control.  Patient states that he does not feel apathetic towards life or feel like a zombie when taking his medications.  Patient denies anxiety and states that whenever he is anxious he takes his hydroxyzine.  Patient denies any new stressors at this time.  Patient is alert and oriented x4, pleasant, calm, cooperative, and fully engaged in conversation during the encounter.  Patient endorses feeling tired.  Patient denies suicidal or homicidal ideations.  He further denies auditory or visual hallucinations and does not appear to be responding to internal/external stimuli.  Patient endorses good sleep and receives on average 8 hours of sleep each night.  Patient endorses good appetite and eats on average 2-3 meals per day along with snacks.  Patient denies alcohol consumption and tobacco use.  Patient endorses illicit drug use in the form of marijuana.  Visit Diagnosis:    ICD-10-CM   1. Anxiety   F41.9     2. Bipolar 1 disorder, mixed, full remission (HCC)  F31.78 oxcarbazepine (TRILEPTAL) 600 MG tablet    QUEtiapine (SEROQUEL) 200 MG tablet    benztropine (COGENTIN) 0.5 MG tablet      Past Psychiatric History:  Bipolar 1 disorder   History of several emergency room visits as well as psychiatry hospitalizations in the past.  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    Past Surgical History:  Procedure Laterality Date   NO PAST SURGERIES     ORIF ANKLE FRACTURE Right 01/31/2017   Procedure: OPEN REDUCTION INTERNAL FIXATION (ORIF) RIGHT TRIMALLEOLAR ANKLE FRACTURE;  Surgeon: [name], MD;  Location: [facility] OR;  Service: Orthopedics;  Laterality: Right;    Family Psychiatric History:  No reported family history of psychiatric illness  Family History:  Family History  Problem Relation Age of Onset   Cancer Father    Diabetes Maternal Grandmother    Alzheimer's disease Paternal Grandmother    Heart failure Paternal Grandmother    Heart failure Paternal Grandfather    Heart failure Maternal Grandfather     Social History:  Social History   Socioeconomic History   Marital status: Single    Spouse name: Not on file   Number of children: Not on file   Years of education: Not on file   Highest education level:  Not on file  Occupational History   Not on file  Tobacco Use   Smoking status: Every Day    Packs/day: 1.00    Years: 10.00    Pack years: 10.00    Types: Cigarettes   Smokeless tobacco: Never   Tobacco comments:    off and on  Substance and Sexual Activity   Alcohol use: Yes   Drug use: Yes    Types: Marijuana, Cocaine    Comment: heroin   Sexual activity: Not on file    Comment: Unknown  Other Topics Concern   Not on file  Social History Narrative   Not  on file   Social Determinants of Health   Financial Resource Strain: Not on file  Food Insecurity: Not on file  Transportation Needs: Not on file  Physical Activity: Not on file  Stress: Not on file  Social Connections: Not on file    Allergies:  Allergies  Allergen Reactions   Amoxicillin Swelling and Rash    "throat started to close up" Has patient had a PCN reaction causing immediate rash, facial/tongue/throat swelling, SOB or lightheadedness with hypotension: Yes Has patient had a PCN reaction causing severe rash involving mucus membranes or skin necrosis: Yes Has patient had a PCN reaction that required hospitalization No Has patient had a PCN reaction occurring within the last 10 years: No If all of the above answers are "NO", then may proceed with Cephalosporin use.    Penicillins Rash and Other (See Comments)    Has patient had a PCN reaction causing immediate rash, facial/tongue/throat swelling, SOB or lightheadedness with hypotension: unsure Has patient had a PCN reaction causing severe rash involving mucus membranes or skin necrosis: Unsure Has patient had a PCN reaction that required hospitalization Unsure Has patient had a PCN reaction occurring within the last 10 years: No If all of the above answers are "NO", then may proceed with Cephalosporin use.    Metabolic Disorder Labs: Lab Results  Component Value Date   HGBA1C 6.2 (H) 12/10/2015   MPG 131 12/10/2015   Lab Results  Component Value Date   PROLACTIN 28.2 (H) 12/10/2015   Lab Results  Component Value Date   CHOL 119 12/10/2015   TRIG 91 12/10/2015   HDL 34 (L) 12/10/2015   CHOLHDL 3.5 12/10/2015   VLDL 18 12/10/2015   LDLCALC 67 12/10/2015   Lab Results  Component Value Date   TSH 1.744 12/10/2015    Therapeutic Level Labs: No results found for: LITHIUM No results found for: VALPROATE No components found for:  CBMZ  Current Medications: Current Outpatient Medications  Medication Sig  Dispense Refill   hydrOXYzine (ATARAX) 25 MG tablet Take 1 tablet (25 mg total) by mouth 2 (two) times daily as needed for anxiety. 60 tablet 2   benztropine (COGENTIN) 0.5 MG tablet Take 1 tablet (0.5 mg total) by mouth at bedtime. 30 tablet 2   oxcarbazepine (TRILEPTAL) 600 MG tablet Take 1 tablet (600 mg total) by mouth 2 (two) times daily. 60 tablet 2   QUEtiapine (SEROQUEL) 200 MG tablet Take 1 tablet (200 mg total) by mouth at bedtime. 30 tablet 2   No current facility-administered medications for this visit.     Musculoskeletal: Strength & Muscle Tone: Unable to assess due to telemedicine visit Gait & Station: Unable to assess due to telemedicine visit Patient leans: Unable to assess due to telemedicine visit  Psychiatric Specialty Exam: Review of Systems  Psychiatric/Behavioral:  Negative for decreased concentration,  dysphoric mood, hallucinations, self-injury, sleep disturbance and suicidal ideas. The patient is not nervous/anxious and is not hyperactive.    Blood pressure 123/72, pulse (!) 54, height 6\' 1"  (1.854 m), weight 250 lb (113.4 kg).Body mass index is 32.98 kg/m.  General Appearance: Unable to assess due to telemedicine visit  Eye Contact:  Unable to assess due to telemedicine visit  Speech:  Clear and Coherent and Normal Rate  Volume:  Normal  Mood:  Euthymic  Affect:  Appropriate  Thought Process:  Coherent and Descriptions of Associations: Intact  Orientation:  Full (Time, Place, and Person)  Thought Content: WDL   Suicidal Thoughts:  No  Homicidal Thoughts:  No  Memory:  Immediate;   Good Recent;   Good Remote;   Good  Judgement:  Good  Insight:  Good  Psychomotor Activity:  Normal  Concentration:  Concentration: Good and Attention Span: Good  Recall:  Good  Fund of Knowledge: Good  Language: Good  Akathisia:  No  Handed:  Right  AIMS (if indicated): not done  Assets:  Communication Skills Desire for Improvement Financial  Resources/Insurance Housing Social Support Transportation Vocational/Educational  ADL's:  Intact  Cognition: WNL  Sleep:  Good   Screenings: AIMS    Flowsheet Row [facility]  AIMS Total Score 0      AUDIT    Flowsheet Row [facility]  Alcohol Use Disorder Identification Test Final Score (AUDIT) 0      GAD-7    Flowsheet Row Clinical Support from 03/29/2022 in [facility] Video Visit from 12/30/2021 in [facility] Office Visit from 10/28/2021 in [facility] Office Visit from 08/09/2021 in [facility] Counselor from 02/15/2021 in [facility]  Total GAD-7 Score 0 0 0 0 2      PHQ2-9    Flowsheet Row Clinical Support from 03/29/2022 in [facility] Video Visit from 12/30/2021 in [facility] Office Visit from 10/28/2021 in [facility] Office Visit from 08/09/2021 in [facility] Counselor from 02/15/2021 in [facility]  PHQ-2 Total Score 0 0 0 0 0  PHQ-9 Total Score -- -- -- -- 0      Flowsheet Row Clinical Support from 03/29/2022 in [facility] Video Visit from 12/30/2021 in [facility] Office Visit from 10/28/2021 in [facility] ([facility])  C-SSRS RISK CATEGORY No Risk No Risk No Risk        Assessment and Plan:   Elex Mainwaring is a 31 year old male with a past psychiatric history significant for bipolar 1 disorder and anxiety who presents to [facility] for follow-up and medication management.  Patient reports no issues or concerns  regarding his current medication regimen.  Patient appears to be stable and denies experiencing depression or anxiety.  Patient is requesting refills on his medications following the conclusion of the encounter.  Patient's medications to be e-prescribed to pharmacy of choice.  Collaboration of Care: Collaboration of Care: Medication Management AEB provider is managing patient's psychiatric medications, Psychiatrist AEB patient being followed by this mental health provider, and Referral or follow-up with counselor/therapist AEB patient being seen for counseling and therapy by a licensed clinical social worker at this  facility  Patient/Guardian was advised Release of Information must be obtained prior to any record release in order to collaborate their care with an outside provider. Patient/Guardian was advised if they have not already done so to contact the registration department to sign all necessary forms in order for us to release information regarding their care.   Consent: Patient/Guardian gives verbal consent for treatment and assignment of benefits for services provided during this visit. Patient/Guardian expressed understanding and agreed to proceed.   1. Anxiety Patient to continue taking hydroxyzine 25 mg 2 times daily as needed for the management of his anxiety  2. Bipolar 1 disorder, mixed, full remission (HCC)  - oxcarbazepine (TRILEPTAL) 600 MG tablet; Take 1 tablet (600 mg total) by mouth 2 (two) times daily.  Dispense: 60 tablet; Refill: 2 - QUEtiapine (SEROQUEL) 200 MG tablet; Take 1 tablet (200 mg total) by mouth at bedtime.  Dispense: 30 tablet; Refill: 2 - benztropine (COGENTIN) 0.5 MG tablet; Take 1 tablet (0.5 mg total) by mouth at bedtime.  Dispense: 30 tablet; Refill: 2  Patient to follow up in 3 months Provider spent a total of 18 minutes with the patient /reviewing patient's chart  Meta HatchetUchenna E Francia Verry, PA 03/29/2022, 11:30 AM

## 2022-05-16 ENCOUNTER — Ambulatory Visit (INDEPENDENT_AMBULATORY_CARE_PROVIDER_SITE_OTHER): Payer: No Payment, Other | Admitting: Clinical

## 2022-05-16 DIAGNOSIS — F3178 Bipolar disorder, in full remission, most recent episode mixed: Secondary | ICD-10-CM

## 2022-05-17 NOTE — Plan of Care (Signed)
  Problem: Bipolar Disorder CCP Problem  1  Goal: STG: George [name] WILL IDENTIFY COGNITIVE PATTERNS AND BELIEFS THAT INTERFERE WITH THERAPY ONCE PER SESSION Outcome: Progressing   

## 2022-05-17 NOTE — Progress Notes (Signed)
   THERAPIST PROGRESS NOTE  Session Time: 45 minutes  Participation Level: Active  Behavioral Response: CasualAlertIrritable  Type of Therapy: Individual Therapy  Treatment Goals addressed: Client will identify cognitive patterns and beliefs that interfere with therapy 1x per session  ProgressTowards Goals: Progressing  Interventions: CBT and Supportive  Summary:  George [name] is a 31 y.o. male who presents for the scheduled appointment oriented times five, appropriately dressed, and friendly. Client denied hallucinations and delusions. Client reported he has been feeling irritable and sad. Client reported conflict at his job, in his relationship and within his family. Client reported he has was friends with some guys at work who ultimately turned their back on him. Client reported having negative emotions about making friends and working too make self improvements to be around other people. Client reported some frustration in his relationship because his girlfriend is not always mature acting as he would like her to be. Client reported feeling pressure of not breaking up with her because he does not want to be the bad guy. Client reported his relationship with family is stable. Client reported the dynamic will always be different but things are better than they were. Client reported having some thoughts of sadness regarding how he feels about where he is in life compared to his siblings. Client reported he feels irritable emotions but he does better with processing his thoughts before reacting. Evidence of progress towards goal: client reported using 1 coping skill of journaling his thoughts and writing out best options for how he should respond to triggering situations.   Suicidal/Homicidal: Nowithout intent/plan  Therapist Response:  Therapist began the appointment asking the client how he has been doing since last seen. Therapist used CBT to engage using active listening and  positive emotional support. Therapist used CBT to engage asking the client to describe his stressors. Therapist used CBT to engage with the client to have him identify his progress with past situations of conflict. Therapist used CBT ask the client to identify his progress with frequency of use with coping skills with continued practice in his daily activity.  Therapist assigned the client homework to practice coping skills that helps to process his actions and to work through each situation one by one.   Client was scheduled for next appointment.   Plan: Return again in 5 weeks.  Diagnosis: bipolar 1 disorder, mixed, full remission  Collaboration of Care: Patient refused AEB none requested  Patient/Guardian was advised Release of Information must be obtained prior to any record release in order to collaborate their care with an outside provider. Patient/Guardian was advised if they have not already done so to contact the registration department to sign all necessary forms in order for [place] to release information regarding their care.   Consent: Patient/Guardian gives verbal consent for treatment and assignment of benefits for services provided during this visit. Patient/Guardian expressed understanding and agreed to proceed.   Neena Rhymes Marycatherine Maniscalco, LCSW 05/16/2022

## 2022-06-06 ENCOUNTER — Ambulatory Visit (INDEPENDENT_AMBULATORY_CARE_PROVIDER_SITE_OTHER): Payer: No Payment, Other | Admitting: Clinical

## 2022-06-06 DIAGNOSIS — F3178 Bipolar disorder, in full remission, most recent episode mixed: Secondary | ICD-10-CM

## 2022-06-06 NOTE — Progress Notes (Signed)
   THERAPIST PROGRESS NOTE  Session Time: 50 minutes  Participation Level: Active  Behavioral Response: CasualAlertEuthymic  Type of Therapy: Individual Therapy  Treatment Goals addressed: Client will identify cognitive patterns and believes that interfere with therapy once per session  ProgressTowards Goals: Progressing  Interventions: CBT and Supportive  Summary:  George [name] is a 31 y.o. male who presents for the scheduled session oriented times five, appropriately dressed, and friendly. Client denied hallucinations and delusions. Client reported on today he is doing better than the last appointment. Client reported he is seeing some progress in his relationship with his girlfriend. Client reported he can tell she is working to make things better. Client reported he met her family and received a nice compliment from her mother. Client reported it felt nice that he was recognized for his nice attributes. Client reported he is also worried about his long time friend who has a cycle of substance abuse and negative behaviors. Client reported he tries to help his friend but knows he has to be mindful of how much he engages himself with his friends problems. Client reported he finds himself being attached to the situation because this friend never gave up on him but he has a hard time thinking how to implement having boundaries with his friend without having a guilty conscience. Client reported "I picked up a lot of good skills over the years to where I can better cope". Evidence of progress towards goal:  client reported he is using at least 2 positive coping skills including exercise, journaling because when things bother him he can work it off or express himself.  Flowsheet Row Counselor from 06/06/2022 in [facility]  PHQ-9 Total Score 0        Suicidal/Homicidal: Nowithout intent/plan  Therapist Response:  Therapist began the appointment asking the  client how he has been doing since last seen. Therapist used CBT to engage in active listening and positive emotional support. Therapist used CBT to engage and ask the to describe the stressors contributing the feelings of stress and anxiety. Therapist used CBT to engage with the client to discuss the balance of boundaries in interpersonal relationships when involved with someone who has unhealthy lifestyle. Therapist used CBT to empathize with the client and discuss positive communication and healthy balance to cope with stressors.  Therapist used CBT ask the client to identify his progress with frequency of use with coping skills with continued practice in his daily activity.    Therapist assigned to client homework to continue practicing self-care, assertive communication, and utilizing boundaries.    Plan: Return again in 5 weeks.  Diagnosis: Bipolar 1 disorder, mixed, full remission  Collaboration of Care: Patient refused AEB none requested by the client at this time.  Patient/Guardian was advised Release of Information must be obtained prior to any record release in order to collaborate their care with an outside provider. Patient/Guardian was advised if they have not already done so to contact the registration department to sign all necessary forms in order for us to release information regarding their care.   Consent: Patient/Guardian gives verbal consent for treatment and assignment of benefits for services provided during this visit. Patient/Guardian expressed understanding and agreed to proceed.    Y , LCSW 06/06/2022  

## 2022-06-18 NOTE — Plan of Care (Signed)
  Problem: Bipolar Disorder CCP Problem  1  Goal: STG: George [name] WILL IDENTIFY COGNITIVE PATTERNS AND BELIEFS THAT INTERFERE WITH THERAPY ONCE PER SESSION Outcome: Progressing   

## 2022-06-27 ENCOUNTER — Encounter (HOSPITAL_COMMUNITY): Payer: Self-pay | Admitting: Physician Assistant

## 2022-06-27 ENCOUNTER — Ambulatory Visit (HOSPITAL_COMMUNITY): Payer: No Payment, Other | Admitting: Physician Assistant

## 2022-06-27 DIAGNOSIS — F3178 Bipolar disorder, in full remission, most recent episode mixed: Secondary | ICD-10-CM

## 2022-06-27 DIAGNOSIS — F419 Anxiety disorder, unspecified: Secondary | ICD-10-CM

## 2022-06-27 MED ORDER — HYDROXYZINE HCL 25 MG PO TABS: 25.0000 mg | ORAL_TABLET | Freq: Two times a day (BID) | ORAL | 2 refills | Status: DC | PRN

## 2022-06-27 MED ORDER — QUETIAPINE FUMARATE 200 MG PO TABS: 200.0000 mg | ORAL_TABLET | Freq: Every day | ORAL | 2 refills | Status: DC

## 2022-06-27 MED ORDER — BENZTROPINE MESYLATE 0.5 MG PO TABS: 0.5000 mg | ORAL_TABLET | Freq: Every day | ORAL | 2 refills | Status: DC

## 2022-06-27 MED ORDER — OXCARBAZEPINE 600 MG PO TABS: 600.0000 mg | ORAL_TABLET | Freq: Two times a day (BID) | ORAL | 2 refills | Status: DC

## 2022-06-27 NOTE — Progress Notes (Signed)
BH MD/PA/NP OP Progress Note  06/27/2022 11:3 AM George [name]  MRN:  [id number]  Chief Complaint:  Chief Complaint  Patient presents with   Medication Management    F/U   HPI:   George [name] is a 31 year old male with a past psychiatric history significant for bipolar 1 disorder and anxiety who presents to [facility] for follow-up and medication management.  Patient is currently being managed on the following medications:  Benztropine 0.5 mg at bedtime Seroquel 200 mg at bedtime Oxcarbazepine (Trileptal) 600 mg 2 times daily Hydroxyzine 25 mg 2 times daily as needed  Patient reports no issues or concerns regarding his current medication regimen.  Patient denies experiencing depression or anxiety at this time.  In regards to stressors, patient does endorse his normal day-to-day stressors.  Patient states that most of his stressors are easily managed and are often times dealt with by the end of the day.  Regarding his mental health, patient states that nothing keeps him down and that he finds ways to cope and move forward.  Patient is alert and oriented x4, pleasant, calm, cooperative, and fully engaged in conversation during the encounter.  Patient endorses good mood.  Patient denied suicidal or homicidal ideations.  He further denies auditory or visual hallucinations and does not appear to be responding to internal/external stimuli.  Patient endorses good sleep and receives on average 6 hours of sleep each night.  Patient endorses good appetite and eats on average 2 meals per day.  Patient endorses alcohol consumption from time to time.  Patient denies tobacco use.  Patient endorses illicit drug use in the form of THC on occasion.  Visit Diagnosis:    ICD-10-CM   1. Bipolar 1 disorder, mixed, full remission (HCC)  F31.78 oxcarbazepine (TRILEPTAL) 600 MG tablet    QUEtiapine (SEROQUEL) 200 MG tablet    benztropine (COGENTIN) 0.5 MG tablet     2. Anxiety  F41.9 hydrOXYzine (ATARAX) 25 MG tablet      Past Psychiatric History:  Bipolar 1 disorder   History of several emergency room visits as well as psychiatry hospitalizations in the past.  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    Past Surgical History:  Procedure Laterality Date   NO PAST SURGERIES     ORIF ANKLE FRACTURE Right 01/31/2017   Procedure: OPEN REDUCTION INTERNAL FIXATION (ORIF) RIGHT TRIMALLEOLAR ANKLE FRACTURE;  Surgeon: [name], MD;  Location: [facility] OR;  Service: Orthopedics;  Laterality: Right;    Family Psychiatric History:  No reported family history of psychiatric illness  Family History:  Family History  Problem Relation Age of Onset   Cancer Father    Diabetes Maternal Grandmother    Alzheimer's disease Paternal Grandmother    Heart failure Paternal Grandmother    Heart failure Paternal Grandfather    Heart failure Maternal Grandfather     Social History:  Social History   Socioeconomic History   Marital status: Single    Spouse name: Not on file   Number of children: Not on file   Years of education: Not on file   Highest education level: Not on file  Occupational History   Not on file  Tobacco Use   Smoking status: Every  Day    Packs/day: 1.00    Years: 10.00    Total pack years: 10.00    Types: Cigarettes   Smokeless tobacco: Never   Tobacco comments:    off and on  Substance and Sexual Activity   Alcohol use: Yes   Drug use: Yes    Types: Marijuana, Cocaine    Comment: heroin   Sexual activity: Not on file    Comment: Unknown  Other Topics Concern   Not on file  Social History Narrative   Not on file   Social Determinants of Health   Financial Resource Strain: Not on file  Food Insecurity: Not on file   Transportation Needs: Not on file  Physical Activity: Not on file  Stress: Not on file  Social Connections: Not on file    Allergies:  Allergies  Allergen Reactions   Amoxicillin Swelling and Rash    "throat started to close up" Has patient had a PCN reaction causing immediate rash, facial/tongue/throat swelling, SOB or lightheadedness with hypotension: Yes Has patient had a PCN reaction causing severe rash involving mucus membranes or skin necrosis: Yes Has patient had a PCN reaction that required hospitalization No Has patient had a PCN reaction occurring within the last 10 years: No If all of the above answers are "NO", then may proceed with Cephalosporin use.    Penicillins Rash and Other (See Comments)    Has patient had a PCN reaction causing immediate rash, facial/tongue/throat swelling, SOB or lightheadedness with hypotension: unsure Has patient had a PCN reaction causing severe rash involving mucus membranes or skin necrosis: Unsure Has patient had a PCN reaction that required hospitalization Unsure Has patient had a PCN reaction occurring within the last 10 years: No If all of the above answers are "NO", then may proceed with Cephalosporin use.    Metabolic Disorder Labs: Lab Results  Component Value Date   HGBA1C 6.2 (H) 12/10/2015   MPG 131 12/10/2015   Lab Results  Component Value Date   PROLACTIN 28.2 (H) 12/10/2015   Lab Results  Component Value Date   CHOL 119 12/10/2015   TRIG 91 12/10/2015   HDL 34 (L) 12/10/2015   CHOLHDL 3.5 12/10/2015   VLDL 18 12/10/2015   LDLCALC 67 12/10/2015   Lab Results  Component Value Date   TSH 1.744 12/10/2015    Therapeutic Level Labs: No results found for: "LITHIUM" No results found for: "VALPROATE" No results found for: "CBMZ"  Current Medications: Current Outpatient Medications  Medication Sig Dispense Refill   benztropine (COGENTIN) 0.5 MG tablet Take 1 tablet (0.5 mg total) by mouth at bedtime. 30 tablet  2   hydrOXYzine (ATARAX) 25 MG tablet Take 1 tablet (25 mg total) by mouth 2 (two) times daily as needed for anxiety. 60 tablet 2   oxcarbazepine (TRILEPTAL) 600 MG tablet Take 1 tablet (600 mg total) by mouth 2 (two) times daily. 60 tablet 2   QUEtiapine (SEROQUEL) 200 MG tablet Take 1 tablet (200 mg total) by mouth at bedtime. 30 tablet 2   No current facility-administered medications for this visit.     Musculoskeletal: Strength & Muscle Tone: within normal limits Gait & Station: Normal Patient leans: N/A  Psychiatric Specialty Exam: Review of Systems  Psychiatric/Behavioral:  Negative for decreased concentration, dysphoric mood, hallucinations, self-injury, sleep disturbance and suicidal ideas. The patient is not nervous/anxious and is not hyperactive.     Blood pressure 122/84, pulse 66, height 5\' 10"  (1.778 m), weight 244 lb (  110.7 kg).Body mass index is 35.01 kg/m.  General Appearance: Unable to assess due to telemedicine visit  Eye Contact:  Unable to assess due to telemedicine visit  Speech:  Clear and Coherent and Normal Rate  Volume:  Normal  Mood:  Euthymic  Affect:  Appropriate  Thought Process:  Coherent and Descriptions of Associations: Intact  Orientation:  Full (Time, Place, and Person)  Thought Content: WDL   Suicidal Thoughts:  No  Homicidal Thoughts:  No  Memory:  Immediate;   Good Recent;   Good Remote;   Good  Judgement:  Good  Insight:  Good  Psychomotor Activity:  Normal  Concentration:  Concentration: Good and Attention Span: Good  Recall:  Good  Fund of Knowledge: Good  Language: Good  Akathisia:  No  Handed:  Right  AIMS (if indicated): not done  Assets:  Communication Skills Desire for Improvement Financial Resources/Insurance Housing Social Support Transportation Vocational/Educational  ADL's:  Intact  Cognition: WNL  Sleep:  Good   Screenings: AIMS    Flowsheet Row [facility]  AIMS Total Score 0      AUDIT    Flowsheet Row [facility]  Alcohol Use Disorder Identification Test Final Score (AUDIT) 0      GAD-7    Flowsheet Row Clinical Support from 06/27/2022 in [facility] Office Visit from 03/29/2022 in [facility] Video Visit from 12/30/2021 in [facility] Office Visit from 10/28/2021 in [facility] Office Visit from 08/09/2021 in [facility]  Total GAD-7 Score 0 0 0 0 0      PHQ2-9    Flowsheet Row Clinical Support from 06/27/2022 in [facility] Counselor from 06/06/2022 in [facility] Office Visit from 03/29/2022 in [facility] Video Visit from 12/30/2021 in [facility] Office Visit from 10/28/2021 in [facility]  PHQ-2 Total Score 0 0 0 0 0  PHQ-9 Total Score -- 0 -- -- --      Flowsheet Row Clinical Support from 06/27/2022 in [facility] Office Visit from 03/29/2022 in [facility] Video Visit from 12/30/2021 in [facility]  C-SSRS RISK CATEGORY No Risk No Risk No Risk        Assessment and Plan:   Chavez Rosol is a 31 year old male with a past psychiatric history significant for bipolar 1 disorder and anxiety who presents to [facility] for follow-up and medication management.  Patient reports no issues or concerns regarding his current medication regimen.  Patient denies experiencing depression or anxiety and appears stable on his current medication regimen.  Patient to continue taking medication as prescribed.  Patient's medications to be  e-prescribed to pharmacy of choice.  Collaboration of Care: Collaboration of Care: Medication Management AEB provider is managing patient's psychiatric medications, Psychiatrist AEB patient being followed by this mental health provider, and Referral or follow-up with counselor/therapist AEB patient being seen for counseling and therapy by a licensed clinical social worker at this facility  Patient/Guardian was advised Release of Information must be obtained prior to any record release in order to collaborate their care with an outside provider. Patient/Guardian was advised if they have not already done so to  contact the registration department to sign all necessary forms in order for [place] to release information regarding their care.   Consent: Patient/Guardian gives verbal consent for treatment and assignment of benefits for services provided during this visit. Patient/Guardian expressed understanding and agreed to proceed.   1. Bipolar 1 disorder, mixed, full remission (HCC)  - oxcarbazepine (TRILEPTAL) 600 MG tablet; Take 1 tablet (600 mg total) by mouth 2 (two) times daily.  Dispense: 60 tablet; Refill: 2 - QUEtiapine (SEROQUEL) 200 MG tablet; Take 1 tablet (200 mg total) by mouth at bedtime.  Dispense: 30 tablet; Refill: 2 - benztropine (COGENTIN) 0.5 MG tablet; Take 1 tablet (0.5 mg total) by mouth at bedtime.  Dispense: 30 tablet; Refill: 2  2. Anxiety  - hydrOXYzine (ATARAX) 25 MG tablet; Take 1 tablet (25 mg total) by mouth 2 (two) times daily as needed for anxiety.  Dispense: 60 tablet; Refill: 2  Patient to follow up in 3 months Provider spent a total of 21 minutes with the patient /reviewing patient's chart  Meta Hatchet, PA 03/29/2022, 11:30 AM

## 2022-07-31 ENCOUNTER — Ambulatory Visit (INDEPENDENT_AMBULATORY_CARE_PROVIDER_SITE_OTHER): Payer: No Payment, Other | Admitting: Clinical

## 2022-07-31 DIAGNOSIS — F3178 Bipolar disorder, in full remission, most recent episode mixed: Secondary | ICD-10-CM | POA: Diagnosis not present

## 2022-07-31 NOTE — Plan of Care (Signed)
  Problem: Bipolar Disorder CCP Problem  1  Goal: STG: Zeph WILL IDENTIFY COGNITIVE PATTERNS AND BELIEFS THAT INTERFERE WITH THERAPY ONCE PER SESSION Outcome: Progressing   

## 2022-07-31 NOTE — Progress Notes (Signed)
THERAPIST PROGRESS NOTE  Session Time: 45 minutes  Participation Level: Active  Behavioral Response: CasualAlertEuthymic  Type of Therapy: Individual Therapy  Treatment Goals addressed: client will identify cognitive patterns and beliefs that interfere with therapy once per session  ProgressTowards Goals: Progressing  Interventions: CBT and Supportive  Summary:  George [name] is a 31 y.o. male who presents for the scheduled appointment oriented x5, appropriately dressed, and friendly.  Client denied hallucinations and delusions. Client reported he has been doing pretty well but has been experiencing some challenges.  Client reported he has been increasingly taking his anxiety medications due to feeling triggered.  Client reported his stressors have been related to his best friend, work, and the relationship with his girlfriend.  Client reported he has been buying one of his longtime friends stay in his apartment that he is not currently using because he has been living with his girlfriend.  Client reported that his friend has had a history of lying in substance abuse.  Client reported his friend has not been forthcoming with paying rent and has been asking him for excessive failure such as taking him to work daily. Client reported in the relationship with his girlfriend he has days when he feels challenged because he underestimated the work it takes when your partner has kids. Client reported overall he is happy with the relationship and see's this as good practice for being a father one day. Client reported things have been going well with his family and his parents keep in contact with him more.  Evidence of progress towards goal:  client reported 1 skill he uses 7 out of 7 days per week which is using logical thought processing before reacting to triggers and using PRN medication. Client GAD is a 2 and PHQ9 score is 0.    07/31/2022   10:35 AM 06/27/2022   11:24 AM 03/29/2022   11:15  AM 12/30/2021    8:31 AM  GAD 7 : Generalized Anxiety Score  Nervous, Anxious, on Edge 0 0 0 0  Control/stop worrying 0 0 0 0  Worry too much - different things 1 0 0 0  Trouble relaxing 1 0 0 0  Restless 0 0 0 0  Easily annoyed or irritable 0 0 0 0  Afraid - awful might happen 0 0 0 0  Total GAD 7 Score 2 0 0 0  Anxiety Difficulty Not difficult at all Not difficult at all Not difficult at all Not difficult at all     [facility] Counselor from 07/31/2022 in [facility]  PHQ-9 Total Score 0        Suicidal/Homicidal: Nowithout intent/plan  Therapist Response:  Therapist began the appointment asking the client how he has been doing. Therapist used CBT to engage using active listening and positive emotional support. Therapist used CBT to engage and ask the client to describe the reason for stress in his relationship, friendship and with work. Therapist used CBT to engage and normalize his emotions. Therapist used CBT to discuss thought stopping and utilizing boundaries. Therapist used CBT ask the client to identify his progress with frequency of use with coping skills with continued practice in his daily activity.    Therapist assigned the client homework to utilize boundaries and reinforce positive communication.    Plan: Return again in 3 weeks.  Diagnosis: bipolar 1 disorder, mixed, full remission  Collaboration of Care: Other no other resources requested by the client.  Patient/Guardian was advised  Release of Information must be obtained prior to any record release in order to collaborate their care with an outside provider. Patient/Guardian was advised if they have not already done so to contact the registration department to sign all necessary forms in order for [place] to release information regarding their care.   Consent: Patient/Guardian gives verbal consent for treatment and assignment of benefits for services provided during this visit.  Patient/Guardian expressed understanding and agreed to proceed.   Neena Rhymes Vanity Larsson, LCSW 07/31/2022

## 2022-08-18 ENCOUNTER — Ambulatory Visit (HOSPITAL_COMMUNITY): Payer: No Payment, Other | Admitting: Clinical

## 2022-09-04 ENCOUNTER — Ambulatory Visit (INDEPENDENT_AMBULATORY_CARE_PROVIDER_SITE_OTHER): Payer: No Payment, Other | Admitting: Clinical

## 2022-09-04 DIAGNOSIS — F3178 Bipolar disorder, in full remission, most recent episode mixed: Secondary | ICD-10-CM | POA: Diagnosis not present

## 2022-09-04 NOTE — Progress Notes (Signed)
   THERAPIST PROGRESS NOTE  Session Time: 30 minutes  Participation Level: Active  Behavioral Response: CasualAlertEuthymic  Type of Therapy: Individual Therapy  Treatment Goals addressed: Client will identify cognitive patterns and believes that interfere with therapy once per session  ProgressTowards Goals: Progressing  Interventions: CBT  Summary:  George [name] is a 31 y.o. male who presents for the scheduled appointment, oriented times five, appropriately dressed, and friendly. Client denied hallucinations and delusions. Client reported on today he is doing well. Client reported since he was last seen his best friend whom he was letting use his apartment will probably get kicked out. Client reported his friend has not been paying rent if he should and I will follow on him to pay for the last month.  Client reported it may come to was from being kicked out by this years.  Client reported he does not want that to happen to his friend but things are just going to have to happen.  Client reported 1 positive news he is now engaged to his girlfriend.  Client reported her mother and his parents are excited about the news.  Client reported some people on his fiance's side of the family have not seem to have been a supportive but he is trying not to focus on the negativity.  Client reported otherwise work is going well and he has no other complaints.  Client reported his medication is working well. Evidence of progress towards goal: Client reported he is medication compliant 7 days out of the week.  Client reported 1 positive skill of thinking before he reacts in situations that would normally trigger him to be angry.  Suicidal/Homicidal: Nowithout intent/plan  Therapist Response:  Therapist began the appointment asking client how he has been doing since last seen. Therapist used CBT to engage using active listening and positive emotional support. Therapist gave client time to discuss his  stressors and how he is problem-solving and it is friendship. Therapist used CBT to reinforce positive support towards his change in relationship. Therapist used CBT to engage the client and reinforced use of positive coping skills and self-care. Therapist used CBT ask the client to identify his progress with frequency of use with coping skills with continued practice in his daily activity.    Therapist assigned client homework to continue practicing self-care.    Plan: Return again in 3 weeks.  Diagnosis: Back: 1 disorder, mixed, full remission  Collaboration of Care: Patient refused AEB none requested by the client at this time.  Patient/Guardian was advised Release of Information must be obtained prior to any record release in order to collaborate their care with an outside provider. Patient/Guardian was advised if they have not already done so to contact the registration department to sign all necessary forms in order for [place] to release information regarding their care.   Consent: Patient/Guardian gives verbal consent for treatment and assignment of benefits for services provided during this visit. Patient/Guardian expressed understanding and agreed to proceed.   [place], LCSW 09/04/2022

## 2022-09-04 NOTE — Plan of Care (Signed)
  Problem: Bipolar Disorder CCP Problem  1  Goal: STG: Admiral WILL IDENTIFY COGNITIVE PATTERNS AND BELIEFS THAT INTERFERE WITH THERAPY ONCE PER SESSION Outcome: Progressing

## 2022-09-14 ENCOUNTER — Other Ambulatory Visit: Payer: Self-pay

## 2022-09-14 ENCOUNTER — Ambulatory Visit (HOSPITAL_COMMUNITY)
Admission: AD | Admit: 2022-09-14 | Discharge: 2022-09-14 | Disposition: A | Discharge status: Home / Self Care | Payer: No Payment, Other | Attending: Psychiatry | Admitting: Psychiatry

## 2022-09-14 ENCOUNTER — Encounter (HOSPITAL_COMMUNITY): Payer: Self-pay | Admitting: Emergency Medicine

## 2022-09-14 ENCOUNTER — Emergency Department (HOSPITAL_COMMUNITY)
Admission: EM | Admit: 2022-09-14 | Discharge: 2022-09-15 | Disposition: A | Discharge status: Other Acute Inpatient Hospital | Payer: Self-pay | Attending: Emergency Medicine | Admitting: Emergency Medicine

## 2022-09-14 DIAGNOSIS — R45851 Suicidal ideations: Secondary | ICD-10-CM | POA: Insufficient documentation

## 2022-09-14 DIAGNOSIS — T50902A Poisoning by unspecified drugs, medicaments and biological substances, intentional self-harm, initial encounter: Secondary | ICD-10-CM | POA: Insufficient documentation

## 2022-09-14 DIAGNOSIS — F250 Schizoaffective disorder, bipolar type: Secondary | ICD-10-CM

## 2022-09-14 DIAGNOSIS — F329 Major depressive disorder, single episode, unspecified: Secondary | ICD-10-CM | POA: Insufficient documentation

## 2022-09-14 DIAGNOSIS — Z1152 Encounter for screening for COVID-19: Secondary | ICD-10-CM | POA: Insufficient documentation

## 2022-09-14 DIAGNOSIS — F909 Attention-deficit hyperactivity disorder, unspecified type: Secondary | ICD-10-CM | POA: Insufficient documentation

## 2022-09-14 DIAGNOSIS — F419 Anxiety disorder, unspecified: Secondary | ICD-10-CM | POA: Insufficient documentation

## 2022-09-14 LAB — COMPREHENSIVE METABOLIC PANEL
ALT: 17 U/L (ref 0–44)
AST: 20 U/L (ref 15–41)
Albumin: 4.1 g/dL (ref 3.5–5.0)
Alkaline Phosphatase: 62 U/L (ref 38–126)
Anion gap: 3 — ABNORMAL LOW (ref 5–15)
BUN: 14 mg/dL (ref 6–20)
CO2: 27 mmol/L (ref 22–32)
Calcium: 9.1 mg/dL (ref 8.9–10.3)
Chloride: 107 mmol/L (ref 98–111)
Creatinine, Ser: 0.93 mg/dL (ref 0.61–1.24)
GFR, Estimated: >60 mL/min (ref >60)
Glucose, Bld: 96 mg/dL (ref 70–99)
Potassium: 4.1 mmol/L (ref 3.5–5.1)
Sodium: 137 mmol/L (ref 135–145)
Total Bilirubin: 0.4 mg/dL (ref 0.3–1.2)
Total Protein: 7.6 g/dL (ref 6.5–8.1)

## 2022-09-14 LAB — CBC WITH DIFFERENTIAL/PLATELET
Abs Immature Granulocytes: 0.02 10*3/uL (ref 0.00–0.07)
Basophils Absolute: 0 10*3/uL (ref 0.0–0.1)
Basophils Relative: 1 %
Eosinophils Absolute: 0.1 10*3/uL (ref 0.0–0.5)
Eosinophils Relative: 2 %
HCT: 41.6 % (ref 39.0–52.0)
Hemoglobin: 13.4 g/dL (ref 13.0–17.0)
Immature Granulocytes: 0 %
Lymphocytes Relative: 22 %
Lymphs Abs: 1.2 10*3/uL (ref 0.7–4.0)
MCH: 28 pg (ref 26.0–34.0)
MCHC: 32.2 g/dL (ref 30.0–36.0)
MCV: 86.8 fL (ref 80.0–100.0)
Monocytes Absolute: 0.4 10*3/uL (ref 0.1–1.0)
Monocytes Relative: 8 %
Neutro Abs: 3.8 10*3/uL (ref 1.7–7.7)
Neutrophils Relative %: 67 %
Platelets: 276 10*3/uL (ref 150–400)
RBC: 4.79 MIL/uL (ref 4.22–5.81)
RDW: 14.1 % (ref 11.5–15.5)
WBC: 5.6 10*3/uL (ref 4.0–10.5)
nRBC: 0 % (ref 0.0–0.2)

## 2022-09-14 LAB — ETHANOL: Alcohol, Ethyl (B): <10 mg/dL (ref <10)

## 2022-09-14 LAB — ACETAMINOPHEN LEVEL: Acetaminophen (Tylenol), Serum: <10 ug/mL — ABNORMAL LOW (ref 10–30)

## 2022-09-14 LAB — RESP PANEL BY RT-PCR (FLU A&B, COVID) ARPGX2
Influenza A by PCR: NEGATIVE
Influenza B by PCR: NEGATIVE
SARS Coronavirus 2 by RT PCR: NEGATIVE

## 2022-09-14 LAB — SALICYLATE LEVEL: Salicylate Lvl: <7 mg/dL — ABNORMAL LOW (ref 7.0–30.0)

## 2022-09-14 MED ORDER — QUETIAPINE FUMARATE 100 MG PO TABS
200.0000 mg | ORAL_TABLET | Freq: Every day | ORAL | Status: DC
  Administered 2022-09-14: 200 mg via ORAL
  Filled 2022-09-14: qty 2

## 2022-09-14 MED ORDER — OXCARBAZEPINE 300 MG PO TABS
600.0000 mg | ORAL_TABLET | Freq: Two times a day (BID) | ORAL | Status: DC
  Administered 2022-09-14 – 2022-09-15 (×2): 600 mg via ORAL
  Filled 2022-09-14 (×2): qty 2

## 2022-09-14 MED ORDER — BENZTROPINE MESYLATE 0.5 MG PO TABS
0.5000 mg | ORAL_TABLET | Freq: Every day | ORAL | Status: DC
  Administered 2022-09-14 – 2022-09-15 (×2): 0.5 mg via ORAL
  Filled 2022-09-14 (×2): qty 1

## 2022-09-14 NOTE — BH Assessment (Addendum)
@  2221, Received a call from staff at [facility] Delaney Meigs), patient has been accepted to their facility for admission (anytime after 8am). The accepting provider is Estill Cotta, MD.  Nurse report [phone].  Patient's nurse Truddie Hidden, RN) and Greig Castilla, RN) provided disposition updates.

## 2022-09-14 NOTE — H&P (Signed)
Behavioral Health Medical Screening Exam  HPI: George [name] is a 31 y.o. African-American male who presents voluntarily as a walk-in to [facility] for worsening depression, anxiety and suicidal ideation with plan to jump of the bridge.  Patient reports, "I cannot get myself in control, I'm fighting at home and at work and the smallest thing pisse's me off."  Patient reports that he took a bunch of pills (hydroxyzine) today in an attempt to harm himself.  Patient has past psychiatric history significant for anxiety, bipolar disorder mixed, full remission, bipolar affective disorder currently manic moderate, cannabis use disorder severe dependence, and schizoaffective disorder bipolar type.  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  Assessment: Patient is seen face-to-face and examined in the screen room.  He appears disheveled with teary eyes.  Chart reviewed and findings shared with the treatment team and consult with Dr. Lucianne Muss.  Alert and oriented x4, with speech clear and coherent.  Presents with anxious and depressed mood.  Reports he is always fighting with his family, his supervisor at work, and men that try to act like women.  Added, everyone out there "piss" me off.  Report he does not know the trigger for his depression or suicidal attempt.  Reports he works at [employer] and his supervisor does not like him and he wants to get him fired.  Patient becomes fixated on the supervisor and men that acts like women.  Further states, I feel like I want to hurt these men.  When asked if he has any intent or plan, states no but it is in my mind.  Patient then became visibly anxious.  Reports he was treated [facility] for [facility], past suicidal ideation and depression in 2017 or 2018.  Patient denies AVH, or self injurious behavior.  He denies family history  of mental illness, denies history of abuse and denies access to firearms.  Patient endorses anxiety and rated as 7/10, with 10 being the worst.  Reports symptoms of depression to include self-isolation, crying spells, irritability, hopelessness, worthlessness, guilt, poor concentration and anhedonia.  Reports taking his medication as ordered which include Trileptal, Seroquel, and Cogentin.  He reports sleeping 6 hours last night.  Endorses seeing a psychiatrist and a therapist at [facility] urgent care clinic, where he gets his prescription medications for his mental health.  Reports drinking alcohol occasionally and consuming 5 shots of liquor whenever he drinks.  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  Instructions provided on cessation of polysubstance uses, due to adverse effects on overall psychiatric and medical wellbeing.  Patient nodded in agreement.  Disposition: Patient appears to be at imminent danger to himself and possibly others.  He meets the criteria for inpatient psychiatric admission, however, there is no beds available at [facility].  [facility] long emergency room physician called and report provided.  Patient was sent to [facility] long ED via safe transport.  Patient left [facility] without any incidents.  Total Time spent with patient: 30 minutes  Psychiatric Specialty Exam:  Presentation  General Appearance:  Appropriate for Environment; Casual; Fairly Groomed  Eye Contact: Fair  Speech: Clear and Coherent; Normal Rate  Speech Volume: Normal  Handedness: Right  Mood and Affect  Mood: Anxious; Depressed  Affect: Congruent  Thought Process  Thought Processes: Coherent; Linear  Descriptions  of Associations:Intact  Orientation:Full (Time, Place and Person)  Thought Content:Paranoid Ideation; Perseveration (Patient states," I feel I get stuck and there is no way out.")  History of  Schizophrenia/Schizoaffective disorder:Yes  Duration of Psychotic Symptoms:Greater than six months  Hallucinations:Hallucinations: None; Other (comment) (Patient has thought in his mind to hurt someone especially those men that acts like women if they "piss" him off.)  Ideas of Reference:None  Suicidal Thoughts:Suicidal Thoughts: Yes, Active (Overdosed on hydroxyzine today, but does not know how many pills.) SI Active Intent and/or Plan: With Intent; With Plan; With Access to Means  Homicidal Thoughts:Homicidal Thoughts: Yes, Passive (States he feels like hurting some men that act like women.  However no specific plan) HI Passive Intent and/or Plan: Without Plan  Sensorium  Memory: Immediate Fair; Recent Fair; Remote Good  Judgment: Poor  Insight: Shallow  Executive Functions  Concentration: Good  Attention Span: Good  Recall: Fair  Fund of Knowledge: Fair  Language: Good  Psychomotor Activity  Psychomotor Activity: Psychomotor Activity: Normal  Assets  Assets: Communication Skills; Desire for Improvement; Physical Health; Housing  Sleep  Sleep: Sleep: Good Number of Hours of Sleep: 6  Physical Exam: Physical Exam Vitals and nursing note reviewed.  Constitutional:      Appearance: Normal appearance.  HENT:     Head: Normocephalic and atraumatic.     Right Ear: External ear normal.     Left Ear: External ear normal.     Nose: Nose normal.     Mouth/Throat:     Mouth: Mucous membranes are moist.     Pharynx: Oropharynx is clear.  Eyes:     Extraocular Movements: Extraocular movements intact.     Conjunctiva/sclera: Conjunctivae normal.     Pupils: Pupils are equal, round, and reactive to light.  Cardiovascular:     Rate and Rhythm: Normal rate.     Pulses: Normal pulses.  Pulmonary:     Effort: Pulmonary effort is normal.  Abdominal:     Palpations: Abdomen is soft.  Genitourinary:    Comments: Deferred Musculoskeletal:        General:  Normal range of motion.     Cervical back: Normal range of motion.  Skin:    General: Skin is warm.  Neurological:     General: No focal deficit present.     Mental Status: He is alert and oriented to person, place, and time.  Psychiatric:        Behavior: Behavior normal.     Comments: Patient states he does not have a way out of life struggles.  That he fights at work fight with family and he feels stuck in these thoughts.    Review of Systems  Constitutional: Negative.  Negative for chills and fever.  HENT: Negative.  Negative for hearing loss and tinnitus.   Eyes: Negative.  Negative for blurred vision and double vision.  Respiratory: Negative.  Negative for cough, sputum production, shortness of breath and wheezing.   Cardiovascular: Negative.  Negative for chest pain and palpitations.  Gastrointestinal: Negative.  Negative for heartburn, nausea and vomiting.  Genitourinary: Negative.  Negative for dysuria, frequency and urgency.  Musculoskeletal: Negative.  Negative for myalgias and neck pain.  Skin: Negative.  Negative for itching and rash.  Neurological: Negative.  Negative for dizziness, tingling and headaches.  Endo/Heme/Allergies: Negative.  Negative for environmental allergies and polydipsia. Does not bruise/bleed easily.               Reaction Severity Reaction  Type Noted       Allergies    Amoxicillin  Swelling, Rash High Allergy 01/26/2017 "throat started to close up" Has patient had a PCN reaction causing immediate rash, facial/tongue/throat swelling, SOB or lightheadedness with hypotension: Yes Has patient had a PCN reaction causing severe rash involving mucus membranes or skin necrosis: Yes Has patient had a PCN reaction that required hospitalization No Has patient had a PCN reaction occurring within the last 10 years: No If all of the above answers are "NO", then may proceed with Cephalosporin use.    Penicillins  Rash, Other (See  Comments) Low Allergy 12/03/2011 Has patient had a PCN reaction causing immediate rash, facial/tongue/throat swelling, SOB or lightheadedness with hypotension: unsure Has patient had a PCN reaction causing severe rash involving mucus membranes or skin necrosis: Unsure Has patient had a PCN reaction that required hospitalization Unsure Has patient had a PCN reaction occurring within the last 10 years: No If all of the above answers are "NO", then may proceed with Cephalosporin use.      Psychiatric/Behavioral:  Positive for depression, substance abuse and suicidal ideas. The patient is nervous/anxious.    Blood pressure 129/82, pulse 64, temperature 98.3 F (36.8 C), temperature source Oral, resp. rate 18. There is no height or weight on file to calculate BMI.  Musculoskeletal: Strength & Muscle Tone: within normal limits Gait & Station: normal Patient leans: N/A  [place] Scale:  Flowsheet Row [facility] Clinical Support from 06/27/2022 in [facility] Office Visit from 03/29/2022 in [facility]  C-SSRS RISK CATEGORY High Risk No Risk No Risk       Recommendations:  Based on my evaluation the patient appears to have an emergency medical/psychiatric condition for which I recommend the patient be transferred to the emergency department.  Cecilie Lowers, FNP 09/14/2022, 9:45 AM

## 2022-09-14 NOTE — ED Notes (Signed)
Pt care taken, no complaints at this time. Alert and resting on bed.

## 2022-09-14 NOTE — ED Provider Notes (Signed)
[facility]-EMERGENCY DEPT Provider Note   CSN: [id number] Arrival date & time: 09/14/22  1027     History  Chief Complaint  Patient presents with   Suicidal    George [name] is a 31 y.o. male.  Patient is a 31 year old male with a history of ADHD, depression and anxiety as well as bipolar disease who is presenting today with suicidal ideation.  Patient reports he goes through waves of just having significant depression and other times when he is okay.  He reports for the last 1 to 2 weeks he is just felt more down and last night he was so depressed he decided he would take an overdose of hydroxyzine.  He reports that from 11:00 to about 2 AM he took a total of maybe 15 tablets.  He reports he just did not want to wake up.  When he did wake up this morning because of his symptoms he was concerned that he might hurt himself in the future and went to behavioral health urgent care.  He has been evaluated there and they felt that he needed inpatient admission but they did not have a bed so they sent him here for further care.  Patient denies any abdominal pain, chest pain, shortness of breath, nausea or vomiting.  He reports initially he was sleepy after having the hydroxyzine but now he feels fine.  He denies taking anything else.  The history is provided by the patient.       Home Medications Prior to Admission medications   Medication Sig Start Date End Date Taking? Authorizing Provider  benztropine (COGENTIN) 0.5 MG tablet Take 1 tablet (0.5 mg total) by mouth at bedtime. 06/27/22   Nwoko, Tommas Olp, PA  hydrOXYzine (ATARAX) 25 MG tablet Take 1 tablet (25 mg total) by mouth 2 (two) times daily as needed for anxiety. 06/27/22   Nwoko, Tommas Olp, PA  oxcarbazepine (TRILEPTAL) 600 MG tablet Take 1 tablet (600 mg total) by mouth 2 (two) times daily. 06/27/22   Nwoko, Tommas Olp, PA  QUEtiapine (SEROQUEL) 200 MG tablet Take 1 tablet (200 mg total) by mouth at bedtime.  06/27/22   Nwoko, Tommas Olp, PA      Allergies    Amoxicillin and Penicillins    Review of Systems   Review of Systems  Physical Exam Updated Vital Signs BP (!) 124/94 (BP Location: Right Arm)   Pulse (!) 58   Temp 97.6 F (36.4 C) (Oral)   Resp 18   SpO2 100%  Physical Exam Vitals and nursing note reviewed.  Constitutional:      General: He is not in acute distress.    Appearance: He is well-developed.  HENT:     Head: Normocephalic and atraumatic.  Eyes:     Conjunctiva/sclera: Conjunctivae normal.     Pupils: Pupils are equal, round, and reactive to light.  Cardiovascular:     Rate and Rhythm: Normal rate and regular rhythm.     Heart sounds: No murmur heard. Pulmonary:     Effort: Pulmonary effort is normal. No respiratory distress.     Breath sounds: Normal breath sounds. No wheezing or rales.  Abdominal:     General: There is no distension.     Palpations: Abdomen is soft.     Tenderness: There is no abdominal tenderness. There is no guarding or rebound.  Musculoskeletal:        General: No tenderness. Normal range of motion.     Cervical  back: Normal range of motion and neck supple.  Skin:    General: Skin is warm and dry.     Findings: No erythema or rash.  Neurological:     Mental Status: He is alert and oriented to person, place, and time.  Psychiatric:        Mood and Affect: Mood is depressed. Affect is flat.        Behavior: Behavior normal. Behavior is cooperative.        Thought Content: Thought content includes suicidal ideation. Thought content includes suicidal plan.     ED Results / Procedures / Treatments   Labs (all labs ordered are listed, but only abnormal results are displayed) Labs Reviewed  RESP PANEL BY RT-PCR (FLU A&B, COVID) ARPGX2  CBC WITH DIFFERENTIAL/PLATELET  COMPREHENSIVE METABOLIC PANEL  ETHANOL  SALICYLATE LEVEL  ACETAMINOPHEN LEVEL    EKG None  Radiology No results found.  Procedures Procedures     Medications Ordered in ED Medications - No data to display  ED Course/ Medical Decision Making/ A&P                           Medical Decision Making Amount and/or Complexity of Data Reviewed External Data Reviewed: notes.    Details: [facility] Labs: ordered. Decision-making details documented in ED Course.   Pt with multiple medical problems and comorbidities and presenting today with a complaint that caries a high risk for morbidity and mortality.  Here today with a complaint of worsening depression and suicidal attempt last night.  Patient attempted to overdose on hydroxyzine.  He is awake and alert at this time and low suspicion for any long-term effects from taking the hydroxyzine.  His vital signs are reassuring.  Labs are pending but he has been evaluated behavioral health urgent care and they feel that he needs inpatient admission.  We will get a TTS consult and psych holding orders were placed.  Patient is voluntary at this time.         Final Clinical Impression(s) / ED Diagnoses Final diagnoses:  Suicidal ideation  Intentional overdose, initial encounter [facility])    Rx / DC Orders ED Discharge Orders     None         Gwyneth Sprout, MD 09/15/22 1624

## 2022-09-14 NOTE — ED Triage Notes (Signed)
Pt reports being sent by [facility] because they didn't have a bed for him. Pt reports SI w/ plan. Denies HI/ Av/AH.

## 2022-09-14 NOTE — Consult Note (Signed)
[facility] ED ASSESSMENT   Reason for Consult:  SI Referring Physician:   Patient Identification: George [name] MRN:  [id number] ED Chief Complaint: Schizoaffective disorder, bipolar type [facility]  Diagnosis:  Principal Problem:   Schizoaffective disorder, bipolar type [facility])   ED Assessment Time Calculation: Start Time: 1630 Stop Time: 1650 Total Time in Minutes (Assessment Completion): 20  George [name] is a 31 y.o. African-American male who presents voluntarily as a walk-in to [facility] for worsening depression, anxiety and suicidal ideation with plan to jump of the bridge.  Patient reports, "I cannot get myself in control, I'm fighting at home and at work and the smallest thing pisse's me off."  Patient reports that he took a bunch of pills (hydroxyzine) last night in an attempt to harm himself.  Patient has past psychiatric history significant for anxiety, bipolar disorder mixed, full remission, bipolar affective disorder currently manic moderate, cannabis use disorder severe dependence, and schizoaffective disorder bipolar type.  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   On evaluation patient is alert and oriented x 4, pleasant, and cooperative. Speech is clear and coherent. Mood is depressed and affect is congruent with mood. Thought process is coherent and thought content is logical. Denies auditory and visual hallucinations. No indication that patient is responding to internal stimuli. Endorses suicidal ideations with thoughts of overdosing. Endorses HI towards people that irritate him. Denies any intent or plans.    [facility] Provider Assessment: Patient is seen face-to-face and examined in the screen room.  He appears disheveled with teary eyes.  Chart reviewed and findings shared with the treatment team and consult with Dr. Lucianne Muss.  Alert and oriented x4,  with speech clear and coherent.  Presents with anxious and depressed mood.  Reports he is always fighting with his family, his supervisor at work, and men that try to act like women.  Added, everyone out there "piss" me off.  Report he does not know the trigger for his depression or suicidal attempt.  Reports he works at [employer] and his supervisor does not like him and he wants to get him fired.  Patient becomes fixated on the supervisor and men that acts like women.  Further states, I feel like I want to hurt these men.  When asked if he has any intent or plan, states no but it is in my mind.  Patient then became visibly anxious.  Reports [facility], past suicidal ideation and depression in 2017 or 2018.   Patient denies AVH, or self injurious behavior.  He denies family history of mental illness, denies history of abuse and denies access to firearms.  Patient endorses anxiety and rated as 7/10, with 10 being the worst.  Reports symptoms of depression to include self-isolation, crying spells, irritability, hopelessness, worthlessness, guilt, poor concentration and anhedonia.  Reports taking his medication as ordered which include Trileptal, Seroquel, and Cogentin.  He reports sleeping 6 hours last night.  Endorses seeing a psychiatrist and a therapist at [facility] urgent care clinic, where he gets his prescription medications for his mental health.  Reports drinking alcohol occasionally and consuming 5 shots of liquor whenever he drinks.  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  Instructions provided on cessation of polysubstance uses, due to adverse effects on overall psychiatric and medical wellbeing.  Patient nodded in agreement.    Past Psychiatric History: see above  Risk to Self or Others: Is the patient at risk to self? Yes Has the patient been a risk to self in the past 6 months? No Has the patient been a  risk to self within the distant past? Yes Is the patient a risk to others? Yes Has the patient been a risk to others in the past 6 months? No Has the patient been a risk to others within the distant past? Yes  [place] Scale:  Flowsheet Row [facility] Clinical Support from 06/27/2022 in [facility] Office Visit from 03/29/2022 in [facility]  C-SSRS RISK CATEGORY High Risk No Risk No Risk       AIMS:  , , ,  ,   ASAM:    Substance Abuse:     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    Past Surgical History:  Procedure Laterality Date   NO PAST SURGERIES     ORIF ANKLE FRACTURE Right 01/31/2017   Procedure: OPEN REDUCTION INTERNAL FIXATION (ORIF) RIGHT TRIMALLEOLAR ANKLE FRACTURE;  Surgeon: [name], MD;  Location: [facility] OR;  Service: Orthopedics;  Laterality: Right;   Family History:  Family History  Problem Relation Age of Onset   Cancer Father    Diabetes Maternal Grandmother    Alzheimer's disease Paternal Grandmother    Heart failure Paternal Grandmother    Heart failure Paternal Grandfather    Heart failure Maternal Grandfather     Social History:  Social History   Substance and Sexual Activity  Alcohol Use Yes     Social History   Substance and Sexual Activity  Drug Use Yes   Types: Marijuana, Cocaine   Comment: heroin    Social History   Socioeconomic History   Marital status: Single    Spouse name: Not on file   Number of children: Not on file   Years of education: Not on file   Highest education level: Not on file  Occupational History   Not on file  Tobacco Use   Smoking status: Every Day    Packs/day: 1.00    Years: 10.00    Total  pack years: 10.00    Types: Cigarettes   Smokeless tobacco: Never   Tobacco comments:    off and on  Substance and Sexual Activity   Alcohol use: Yes   Drug use: Yes    Types: Marijuana, Cocaine    Comment: heroin   Sexual activity: Not on file    Comment: Unknown  Other Topics Concern   Not on file  Social History Narrative   Not on file   Social Determinants of Health   Financial Resource Strain: Not on file  Food Insecurity: Not on file  Transportation Needs: Not on file  Physical Activity: Not on file  Stress: Not on file  Social Connections: Not on file   Additional Social History:    Allergies:   Allergies  Allergen Reactions   Amoxicillin Anaphylaxis, Itching, Swelling and Rash    "throat started to close up" Has patient had a PCN reaction causing immediate rash, facial/tongue/throat swelling, SOB or lightheadedness with hypotension:  Yes Has patient had a PCN reaction causing severe rash involving mucus membranes or skin necrosis: Yes Has patient had a PCN reaction that required hospitalization No Has patient had a PCN reaction occurring within the last 10 years: No If all of the above answers are "NO", then may proceed with Cephalosporin use.    Penicillins Anaphylaxis, Itching, Rash and Other (See Comments)    Has patient had a PCN reaction causing immediate rash, facial/tongue/throat swelling, SOB or lightheadedness with hypotension: unsure Has patient had a PCN reaction causing severe rash involving mucus membranes or skin necrosis: Unsure Has patient had a PCN reaction that required hospitalization Unsure Has patient had a PCN reaction occurring within the last 10 years: No If all of the above answers are "NO", then may proceed with Cephalosporin use.    Labs:  Results for orders placed or performed during the hospital encounter of 09/14/22 (from the past 48 hour(s))  Resp Panel by RT-PCR (Flu A&B, Covid) Anterior Nasal Swab     Status: None   Collection  Time: 09/14/22  2:47 PM   Specimen: Anterior Nasal Swab  Result Value Ref Range   SARS Coronavirus 2 by RT PCR NEGATIVE NEGATIVE    Comment: (NOTE) SARS-CoV-2 target nucleic acids are NOT DETECTED.  The SARS-CoV-2 RNA is generally detectable in upper respiratory specimens during the acute phase of infection. The lowest concentration of SARS-CoV-2 viral copies this assay can detect is 138 copies/mL. A negative result does not preclude SARS-Cov-2 infection and should not be used as the sole basis for treatment or other patient management decisions. A negative result may occur with  improper specimen collection/handling, submission of specimen other than nasopharyngeal swab, presence of viral mutation(s) within the areas targeted by this assay, and inadequate number of viral copies(<138 copies/mL). A negative result must be combined with clinical observations, patient history, and epidemiological information. The expected result is Negative.  Fact Sheet for Patients:  BloggerCourse.comhttps://www.fda.gov/media/152166/download  Fact Sheet for Healthcare Providers:  SeriousBroker.ithttps://www.fda.gov/media/152162/download  This test is no t yet approved or cleared by the [place] FDA and  has been authorized for detection and/or diagnosis of SARS-CoV-2 by FDA under an Emergency Use Authorization (EUA). This EUA will remain  in effect (meaning this test can be used) for the duration of the COVID-19 declaration under Section 564(b)(1) of the Act, 21 U.S.C.section 360bbb-3(b)(1), unless the authorization is terminated  or revoked sooner.       Influenza A by PCR NEGATIVE NEGATIVE   Influenza B by PCR NEGATIVE NEGATIVE    Comment: (NOTE) The Xpert Xpress SARS-CoV-2/FLU/RSV plus assay is intended as an aid in the diagnosis of influenza from Nasopharyngeal swab specimens and should not be used as a sole basis for treatment. Nasal washings and aspirates are unacceptable for Xpert Xpress  SARS-CoV-2/FLU/RSV testing.  Fact Sheet for Patients: BloggerCourse.comhttps://www.fda.gov/media/152166/download  Fact Sheet for Healthcare Providers: SeriousBroker.ithttps://www.fda.gov/media/152162/download  This test is not yet approved or cleared by the [place] FDA and has been authorized for detection and/or diagnosis of SARS-CoV-2 by FDA under an Emergency Use Authorization (EUA). This EUA will remain in effect (meaning this test can be used) for the duration of the COVID-19 declaration under Section 564(b)(1) of the Act, 21 U.S.C. section 360bbb-3(b)(1), unless the authorization is terminated or revoked.  Performed at [facility], [address] [place], [place] [id number]   CBC with Differential/Platelet     Status: None   Collection Time: 09/14/22  2:47 PM  Result Value  Ref Range   WBC 5.6 4.0 - 10.5 K/uL   RBC 4.79 4.22 - 5.81 MIL/uL   Hemoglobin 13.4 13.0 - 17.0 g/dL   HCT 81.1 91.4 - 78.2 %   MCV 86.8 80.0 - 100.0 fL   MCH 28.0 26.0 - 34.0 pg   MCHC 32.2 30.0 - 36.0 g/dL   RDW 95.6 21.3 - 08.6 %   Platelets 276 150 - 400 K/uL   nRBC 0.0 0.0 - 0.2 %   Neutrophils Relative % 67 %   Neutro Abs 3.8 1.7 - 7.7 K/uL   Lymphocytes Relative 22 %   Lymphs Abs 1.2 0.7 - 4.0 K/uL   Monocytes Relative 8 %   Monocytes Absolute 0.4 0.1 - 1.0 K/uL   Eosinophils Relative 2 %   Eosinophils Absolute 0.1 0.0 - 0.5 K/uL   Basophils Relative 1 %   Basophils Absolute 0.0 0.0 - 0.1 K/uL   Immature Granulocytes 0 %   Abs Immature Granulocytes 0.02 0.00 - 0.07 K/uL    Comment: Performed at [facility], [address], [place], [place] [address]  Comprehensive metabolic panel     Status: Abnormal   Collection Time: 09/14/22  2:47 PM  Result Value Ref Range   Sodium 137 135 - 145 mmol/L   Potassium 4.1 3.5 - 5.1 mmol/L   Chloride 107 98 - 111 mmol/L   CO2 27 22 - 32 mmol/L   Glucose, Bld 96 70 - 99 mg/dL    Comment: Glucose reference range applies only to samples  taken after fasting for at least 8 hours.   BUN 14 6 - 20 mg/dL   Creatinine, Ser 9.62 0.61 - 1.24 mg/dL   Calcium 9.1 8.9 - 95.2 mg/dL   Total Protein 7.6 6.5 - 8.1 g/dL   Albumin 4.1 3.5 - 5.0 g/dL   AST 20 15 - 41 U/L   ALT 17 0 - 44 U/L   Alkaline Phosphatase 62 38 - 126 U/L   Total Bilirubin 0.4 0.3 - 1.2 mg/dL   GFR, Estimated >84 >13 mL/min    Comment: (NOTE) Calculated using the CKD-EPI Creatinine Equation (2021)    Anion gap 3 (L) 5 - 15    Comment: Performed at [facility], [address] [place], [place] [address]  Ethanol     Status: None   Collection Time: 09/14/22  2:47 PM  Result Value Ref Range   Alcohol, Ethyl (B) <10 <10 mg/dL    Comment: (NOTE) Lowest detectable limit for serum alcohol is 10 mg/dL.  For medical purposes only. Performed at [facility], [address] [place], [place] [address]   Salicylate level     Status: Abnormal   Collection Time: 09/14/22  2:47 PM  Result Value Ref Range   Salicylate Lvl <7.0 (L) 7.0 - 30.0 mg/dL    Comment: Performed at [facility], [address], [place], [place] [address]  Acetaminophen level     Status: Abnormal   Collection Time: 09/14/22  2:47 PM  Result Value Ref Range   Acetaminophen (Tylenol), Serum <10 (L) 10 - 30 ug/mL    Comment: (NOTE) Therapeutic concentrations vary significantly. A range of 10-30 ug/mL  may be an effective concentration for many patients. However, some  are best treated at concentrations outside of this range. Acetaminophen concentrations >150 ug/mL at 4 hours after ingestion  and >50 ug/mL at 12 hours after ingestion are often associated with  toxic  reactions.  Performed at [facility], [address], [place], [place] [address]     No current facility-administered medications for this encounter.   Current Outpatient Medications  Medication Sig Dispense Refill   benztropine (COGENTIN) 0.5 MG  tablet Take 1 tablet (0.5 mg total) by mouth at bedtime. 30 tablet 2   GINSENG PO Take 1 tablet by mouth daily.     hydrOXYzine (ATARAX) 25 MG tablet Take 1 tablet (25 mg total) by mouth 2 (two) times daily as needed for anxiety. (Patient taking differently: Take 25 mg by mouth as needed for anxiety.) 60 tablet 2   OVER THE COUNTER MEDICATION Take 1 tablet by mouth daily. Ginkgo Biloba     OVER THE COUNTER MEDICATION Take 2 tablets by mouth daily. L arginine PO     oxcarbazepine (TRILEPTAL) 600 MG tablet Take 1 tablet (600 mg total) by mouth 2 (two) times daily. (Patient taking differently: Take 1,200 mg by mouth at bedtime.) 60 tablet 2   QUEtiapine (SEROQUEL) 200 MG tablet Take 1 tablet (200 mg total) by mouth at bedtime. 30 tablet 2    Musculoskeletal: Strength & Muscle Tone: within normal limits Gait & Station: normal Patient leans: N/A   Psychiatric Specialty Exam: Presentation  General Appearance:  Appropriate for Environment; Casual; Fairly Groomed  Eye Contact: Fair  Speech: Clear and Coherent; Normal Rate  Speech Volume: Normal  Handedness: Right   Mood and Affect  Mood: Anxious; Depressed  Affect: Congruent   Thought Process  Thought Processes: Coherent; Linear  Descriptions of Associations:Intact  Orientation:Full (Time, Place and Person)  Thought Content:Perseveration; Paranoid Ideation  History of Schizophrenia/Schizoaffective disorder:Yes  Duration of Psychotic Symptoms:Greater than six months  Hallucinations:Hallucinations: None  Ideas of Reference:None  Suicidal Thoughts:Suicidal Thoughts: Yes, Active SI Active Intent and/or Plan: With Intent; With Plan; With Means to Carry Out  Homicidal Thoughts:Homicidal Thoughts: Yes, Passive HI Passive Intent and/or Plan: Without Intent; Without Plan   Sensorium  Memory: Immediate Good; Recent Fair; Remote Fair  Judgment: Impaired  Insight: Present   Executive Functions   Concentration: Good  Attention Span: Good  Recall: Fair  Fund of Knowledge: Good  Language: Good   Psychomotor Activity  Psychomotor Activity: Psychomotor Activity: Normal   Assets  Assets: Communication Skills; Desire for Improvement; Housing; Physical Health    Sleep  Sleep: Sleep: Fair Number of Hours of Sleep: 6   Physical Exam: Physical Exam Constitutional:      General: He is not in acute distress.    Appearance: He is not ill-appearing, toxic-appearing or diaphoretic.  HENT:     Right Ear: External ear normal.     Left Ear: External ear normal.  Eyes:     General:        Right eye: No discharge.        Left eye: No discharge.  Cardiovascular:     Rate and Rhythm: Normal rate.  Pulmonary:     Effort: Pulmonary effort is normal. No respiratory distress.  Musculoskeletal:        General: Normal range of motion.     Cervical back: Normal range of motion.  Neurological:     Mental Status: He is alert and oriented to person, place, and time.  Psychiatric:        Thought Content: Thought content is not paranoid or delusional. Thought content does not include homicidal or suicidal ideation.    Review of Systems  Constitutional:  Negative for chills, diaphoresis, fever, malaise/fatigue  and weight loss.  Respiratory:  Negative for cough and shortness of breath.   Cardiovascular:  Negative for chest pain.  Gastrointestinal:  Negative for diarrhea, nausea and vomiting.  Neurological:  Negative for dizziness and seizures.  Psychiatric/Behavioral:  Positive for depression and suicidal ideas. Negative for hallucinations and memory loss. The patient is nervous/anxious and has insomnia.    Blood pressure (!) 124/94, pulse (!) 58, temperature 97.6 F (36.4 C), temperature source Oral, resp. rate 18, SpO2 100 %. There is no height or weight on file to calculate BMI.  Medical Decision Making: George [name] is a 31 y.o. African-American male who presents  voluntarily as a walk-in to [facility] for worsening depression, anxiety and suicidal ideation with plan to jump of the bridge.  Patient reports, "I cannot get myself in control, I'm fighting at home and at work and the smallest thing pisse's me off."  Patient reports that he took a bunch of pills (hydroxyzine) last night in an attempt to harm himself.  Patient has past psychiatric history significant for anxiety, bipolar disorder mixed, full remission, bipolar affective disorder currently manic moderate, cannabis use disorder severe dependence, and schizoaffective disorder bipolar type.  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  Continue home medications: -seroquel 200 mg QHS for schizoaffective disorder -oxcarbazepine 600 mg BID for mood stability -benztropine 0.5 mg daily for EPS prophylaxis   Disposition: Recommend psychiatric Inpatient admission when medically cleared.  Jackelyn Poling, NP 09/14/2022 4:55 PM

## 2022-09-14 NOTE — BH Assessment (Addendum)
George Conn, NP, recommends inpatient treatment for patient, Disposition Social Work reached out to the [facility] Tresa Endo, RN) and requested that patient is reviewed for consideration of inpatient treatment. Kelly verified no though disorder bed availability.   Patient faxed to the following hospitals for consideration of inpatient treatment:    Destination  Service Provider Request Status Selected Services Address Phone Fax Patient Preferred  [facility]  Pending - Request Sent N/A [address] [place] [address] [phone] --  [facility]  Pending - Request Sent N/A [address] [place] [address] [phone] --  [facility]  Pending - Request Sent N/A [address] [place], [place] [address] [phone] --  [facility]  Pending - Request Sent N/A [address] [place] [address] [phone] --  [facility]  Pending - Request Sent N/A [address] [place] [address] [phone] --  [facility]  Pending - Request Sent N/A [address] [place] [address] [phone] --  [facility]  Pending - Request Sent N/A [address] [place] [address] [phone] --  [facility]  Pending - Request Sent N/A [address] [place] [address] [phone] --  [facility]  Pending - Request Sent N/A [address] [place] [address] [phone] --  [facility]  Pending - Request Sent N/A [address] [place] [address] [phone] --  [facility]  Pending - Request Sent N/A [address], [place] [address] [phone] --  [facility]  Pending - Request Sent N/A [address] [place] [address]  [phone] --  [facility]  Pending - Request Sent N/A [address] [place] [address] [phone] --  [facility]  Pending - Request Sent N/A [address] [place] [address] [phone] --  [facility]  Pending - Request Sent N/A [address], [place] [address] [phone] --  [facility]  Pending - Request Sent N/A [address] [place] [address] [phone] --  [facility]  Pending - Request Sent N/A [address] [place] [address] [phone] --  [facility]  Pending - Request Sent N/A [address] [place], [place] [address] [phone] --  [facility]  Pending - Request Sent N/A [address], [place] [address] [phone] --  [facility]  Pending - Request Sent N/A [phone] [address] [place] [address] [phone]

## 2022-09-26 ENCOUNTER — Ambulatory Visit (HOSPITAL_COMMUNITY): Payer: No Payment, Other | Admitting: Clinical

## 2022-10-03 ENCOUNTER — Ambulatory Visit (HOSPITAL_COMMUNITY): Payer: No Payment, Other | Admitting: Clinical

## 2022-10-03 ENCOUNTER — Encounter (HOSPITAL_COMMUNITY): Payer: No Payment, Other | Admitting: Physician Assistant

## 2022-10-04 ENCOUNTER — Encounter (HOSPITAL_COMMUNITY): Payer: No Payment, Other | Admitting: Student

## 2022-10-04 ENCOUNTER — Ambulatory Visit (INDEPENDENT_AMBULATORY_CARE_PROVIDER_SITE_OTHER): Payer: No Payment, Other | Admitting: Student

## 2022-10-04 VITALS — BP 121/78 | HR 65 | Ht 70.0 in | Wt 253.0 lb

## 2022-10-04 DIAGNOSIS — F419 Anxiety disorder, unspecified: Secondary | ICD-10-CM | POA: Diagnosis not present

## 2022-10-04 DIAGNOSIS — F1220 Cannabis dependence, uncomplicated: Secondary | ICD-10-CM

## 2022-10-04 DIAGNOSIS — F3178 Bipolar disorder, in full remission, most recent episode mixed: Secondary | ICD-10-CM | POA: Diagnosis not present

## 2022-10-04 NOTE — Progress Notes (Signed)
[place] MD/PA/NP OP Progress Note  10/04/22 1:30 PM KEVION HUNGERFORD  MRN:  [id number]  Chief Complaint:  Chief Complaint  Patient presents with   Hospitalization Follow-up   HPI: Elvie Tikkanen is a 31 year old male with a psychiatric history of bipolar disorder, anxiety, and cannabis use disorder who presents as for an inpatient hospitalization follow-up after being discharged from [facility] 11/18 for worsening anxiety and SI.  Patient reports the following medication changes at [facility]: Gabapentin 200 mg TID and Seroquel 25 mg PRN (never received a prescription for it). Otherwise his medications have remained as follows:   Benztropine 0.5 mg at bedtime Seroquel 200 mg at bedtime Oxcarbazepine (Trileptal) 600 mg 2 times daily Hydroxyzine 25 mg 2 times daily as needed   Since discharge, patient with transient paranoid ideation. Thinks someone is talking about him, and if that person makes a mistake, he believes it to be intentional toward him. An improvement is that he is no longer reacting to those thoughts. Having less intrusive thoughts and better control when they do occur. Mood has been "alright" as back working and less attached to paranoid ideas. Seroquel beneficial for sleep, but does experience "withdrawals" described as increased drowsiness and somnolence. As well, when missed doses, patient is unable to sleep.   Denies SI/HI/AVH and does not voice delusions.   Physical sx include constipation only, which he attributes to diet, snacking and unhealthy meals.   Patient reports that his marijuana use is unchanged. His other concern is that he would like FMLA paperwork filled out for the time that he missed at work.  GAD- 7: 1 PHQ-2: 0   Total Time Spent in Direct Patient Care: 40 minutes  Visit Diagnosis:    ICD-10-CM   1. Bipolar 1 disorder, mixed, full remission ([place])  F31.78     2. Anxiety  F41.9     3. Cannabis use disorder, severe, dependence ([place])  F12.20        Past Psychiatric History: Bipolar 1 disorder  Recent discharge from [facility] on 09/23/22   History of several emergency room visits as well as psychiatry hospitalizations in the past.  Past Medical History:  Past Medical History:  Diagnosis Date   ADHD (attention deficit hyperactivity disorder)    Ankle fracture 01/2017   RIGHT ANKLE   Anxiety    Bipolar disorder ([place])    Depression    Sleep disorder 04/30/2014    Past Surgical History:  Procedure Laterality Date   NO PAST SURGERIES     ORIF ANKLE FRACTURE Right 01/31/2017   Procedure: OPEN REDUCTION INTERNAL FIXATION (ORIF) RIGHT TRIMALLEOLAR ANKLE FRACTURE;  Surgeon: Leandrew Koyanagi, MD;  Location: [facility];  Service: Orthopedics;  Laterality: Right;    Family Psychiatric History: No reported family history of psychiatric illness  Family History:  Family History  Problem Relation Age of Onset   Cancer Father    Diabetes Maternal Grandmother    Alzheimer's disease Paternal Grandmother    Heart failure Paternal Grandmother    Heart failure Paternal Grandfather    Heart failure Maternal Grandfather     Social History:  Social History   Socioeconomic History   Marital status: Single    Spouse name: Not on file   Number of children: Not on file   Years of education: Not on file   Highest education level: Not on file  Occupational History   Not on file  Tobacco Use   Smoking status: Every Day  Packs/day: 1.00    Years: 10.00    Total pack years: 10.00    Types: Cigarettes   Smokeless tobacco: Never   Tobacco comments:    off and on  Substance and Sexual Activity   Alcohol use: Yes   Drug use: Yes    Types: Marijuana, Cocaine    Comment: heroin   Sexual activity: Not on file    Comment: Unknown  Other Topics Concern   Not on file  Social History Narrative   Not on file   Social Determinants of Health   Financial Resource Strain: Not on file  Food Insecurity: Not on file  Transportation Needs: Not  on file  Physical Activity: Not on file  Stress: Not on file  Social Connections: Not on file    Allergies:  Allergies  Allergen Reactions   Amoxicillin Anaphylaxis, Itching, Swelling and Rash    "throat started to close up" Has patient had a PCN reaction causing immediate rash, facial/tongue/throat swelling, SOB or lightheadedness with hypotension: Yes Has patient had a PCN reaction causing severe rash involving mucus membranes or skin necrosis: Yes Has patient had a PCN reaction that required hospitalization No Has patient had a PCN reaction occurring within the last 10 years: No If all of the above answers are "NO", then may proceed with Cephalosporin use.    Penicillins Anaphylaxis, Itching, Rash and Other (See Comments)    Has patient had a PCN reaction causing immediate rash, facial/tongue/throat swelling, SOB or lightheadedness with hypotension: unsure Has patient had a PCN reaction causing severe rash involving mucus membranes or skin necrosis: Unsure Has patient had a PCN reaction that required hospitalization Unsure Has patient had a PCN reaction occurring within the last 10 years: No If all of the above answers are "NO", then may proceed with Cephalosporin use.    Metabolic Disorder Labs: Lab Results  Component Value Date   HGBA1C 6.2 (H) 12/10/2015   MPG 131 12/10/2015   Lab Results  Component Value Date   PROLACTIN 28.2 (H) 12/10/2015   Lab Results  Component Value Date   CHOL 119 12/10/2015   TRIG 91 12/10/2015   HDL 34 (L) 12/10/2015   CHOLHDL 3.5 12/10/2015   VLDL 18 12/10/2015   LDLCALC 67 12/10/2015   Lab Results  Component Value Date   TSH 1.744 12/10/2015    Therapeutic Level Labs: No results found for: "LITHIUM" No results found for: "VALPROATE" No results found for: "CBMZ"  Current Medications: Current Outpatient Medications  Medication Sig Dispense Refill   benztropine (COGENTIN) 0.5 MG tablet Take 1 tablet (0.5 mg total) by mouth at  bedtime. 30 tablet 2   GINSENG PO Take 1 tablet by mouth daily.     hydrOXYzine (ATARAX) 25 MG tablet Take 1 tablet (25 mg total) by mouth 2 (two) times daily as needed for anxiety. (Patient taking differently: Take 25 mg by mouth as needed for anxiety.) 60 tablet 2   OVER THE COUNTER MEDICATION Take 1 tablet by mouth daily. Ginkgo Biloba     OVER THE COUNTER MEDICATION Take 2 tablets by mouth daily. L arginine PO     oxcarbazepine (TRILEPTAL) 600 MG tablet Take 1 tablet (600 mg total) by mouth 2 (two) times daily. (Patient taking differently: Take 1,200 mg by mouth at bedtime.) 60 tablet 2   QUEtiapine (SEROQUEL) 200 MG tablet Take 1 tablet (200 mg total) by mouth at bedtime. 30 tablet 2   No current facility-administered medications for this visit.  Musculoskeletal: Strength & Muscle Tone: within normal limits Gait & Station: normal Patient leans: N/A  Psychiatric Specialty Exam: Review of Systems  Constitutional:  Negative for activity change, appetite change and unexpected weight change.  Respiratory:  Negative for shortness of breath.   Gastrointestinal:  Positive for constipation. Negative for abdominal pain, diarrhea, nausea and vomiting.  Genitourinary: Negative.   Musculoskeletal: Negative.   Neurological:  Negative for dizziness, tremors, seizures and headaches.    Blood pressure 121/78, pulse 65, height 5\' 10"  (1.778 m), weight 253 lb (114.8 kg), SpO2 99 %.Body mass index is 36.3 kg/m.  General Appearance: Casual and Fairly Groomed  Eye Contact:  Good  Speech:  Clear and Coherent and Normal Rate  Volume:  Normal  Mood:  Euthymic  Affect:  Appropriate and Congruent  Thought Process:  Coherent and Linear  Orientation:  Full (Time, Place, and Person)  Thought Content: Logical   Suicidal Thoughts:  No  Homicidal Thoughts:  No  Memory:  Immediate;   Good Recent;   Good  Judgement:  Fair  Insight:  Fair  Psychomotor Activity:  Normal  Concentration:  Concentration:  Good and Attention Span: Good  Recall:  [employer] of Knowledge: Fair  Language: Good  Akathisia:  No  Handed:  Right  AIMS (if indicated): not done  Assets:  Communication Skills Desire for Improvement Housing Intimacy Physical Health Resilience Social Support  ADL's:  Intact  Cognition: WNL  Sleep:  Fair   Screenings: AIMS    Flowsheet Row Admission (Discharged) from 12/08/2015 in [facility]  AIMS Total Score 0      AUDIT    Flowsheet Row Admission (Discharged) from 12/08/2015 in [facility]  Alcohol Use Disorder Identification Test Final Score (AUDIT) 0      GAD-7    Flowsheet Row Counselor from 07/31/2022 in [place] from 06/27/2022 in [facility] Office Visit from 03/29/2022 in [facility] Video Visit from 12/30/2021 in [facility] Office Visit from 10/28/2021 in [facility]  Total GAD-7 Score 2 0 0 0 0      PHQ2-9    Flowsheet Row Clinical Support from 10/04/2022 in [facility] Counselor from 07/31/2022 in [place] from 06/27/2022 in [facility] Counselor from 06/06/2022 in [facility] Office Visit from 03/29/2022 in [facility]  PHQ-2 Total Score 0 0 0 0 0  PHQ-9 Total Score -- 0 -- 0 --      Flowsheet Row Clinical Support from 10/04/2022 in [facility] Most recent reading at 10/04/2022  1:44 PM ED from 09/14/2022 in [place] DEPT Most recent reading at 09/14/2022 11:15 PM OP Visit from 09/14/2022 in [place] Most recent reading at 09/14/2022  9:26 AM  C-SSRS RISK CATEGORY Error: Q7  should not be populated when Q6 is No High Risk High Risk        Assessment and Plan:  Kashif Polak is a 31 year old male with a psychiatric history of bipolar disorder, anxiety, and cannabis use disorder who presents for an inpatient hospitalization follow-up after being discharged from [facility] 11/18 for worsening anxiety and SI. Patient has noted improvements to his anxiety and paranoia but continues to endorse occurrences at work. With paranoia  that is described more psychotic in nature than trauma-related, will assess patient in future visits to rule-out schizoaffective disorder-bipolar type. Patient largely stable on medication regimen, but would like to increase treatment for anxiety. Discussed adding on Seroquel that was prescribed but never sent to his pharmacy by [place], but due to increased somnolence even on lower doses, opted to instead increase Gabapentin dose.   #Bipolar 1 disorder w/ psychotic features  #Anxiety - Increase gabapentin 300 mg TID  -Continue Hydroxyzine 25 mg TID PRN  #Cannabis Use Disorder - Counseled on decreased use to cessation to aid in paranoia   Collaboration of Care: Collaboration of Care: Medication Management AEB This provider manages patient's medications.   Patient/Guardian was advised Release of Information must be obtained prior to any record release in order to collaborate their care with an outside provider. Patient/Guardian was advised if they have not already done so to contact the registration department to sign all necessary forms in order for [place] to release information regarding their care.   Consent: Patient/Guardian gives verbal consent for treatment and assignment of benefits for services provided during this visit. Patient/Guardian expressed understanding and agreed to proceed.    Lamar Sprinkles, MD 10/04/2022 1:30 PM

## 2022-10-10 ENCOUNTER — Ambulatory Visit (HOSPITAL_COMMUNITY): Payer: No Payment, Other | Admitting: Clinical

## 2022-10-11 MED ORDER — HYDROXYZINE HCL 25 MG PO TABS: 25.0000 mg | ORAL_TABLET | Freq: Three times a day (TID) | ORAL | 2 refills | Status: AC | PRN

## 2022-10-11 MED ORDER — GABAPENTIN 300 MG PO CAPS: 300.0000 mg | ORAL_CAPSULE | Freq: Three times a day (TID) | ORAL | 2 refills | Status: DC

## 2022-10-20 ENCOUNTER — Ambulatory Visit (INDEPENDENT_AMBULATORY_CARE_PROVIDER_SITE_OTHER): Payer: No Payment, Other | Admitting: Clinical

## 2022-10-20 DIAGNOSIS — F3178 Bipolar disorder, in full remission, most recent episode mixed: Secondary | ICD-10-CM

## 2022-10-20 NOTE — Progress Notes (Signed)
   THERAPIST PROGRESS NOTE  Session Time: 45 minutes  Participation Level: Active  Behavioral Response: CasualAlertEuthymic  Type of Therapy: Individual Therapy  Treatment Goals addressed: client will identify cognitive patterns and beliefs that intefere with therapy once per session  ProgressTowards Goals: Progressing  Interventions: CBT and Supportive  Summary:  George [name] is a 31 y.o. male who presents for the scheduled appointment oriented times five, appropriately dressed, and friendly. Client denied hallucinations and delusions. Client reported he is doing pretty well today. Client reported since his hospitalization his symptoms have improved. Client reported a month or so prior to his hospitalization he was having episodes of anxiety exhibited by exaggerated irritability in a situation and difficulty calming down. Client reported it was noticeable by family members as well. Client reported having other psychosocial stressors as well in his relationship, friendship, work and other things were accumulating over time. Client reported being very self aware throughout the process and tried to work with his anxiety until it was too much for him. Client reported he made the right decision voluntarily seeking inpatient treatment. Client reported he has "cycled" in the past and unlike his episodes before when he was unaware until he was IVC, he was very self aware that he was having issues with managing his anxiety. Client reported gabapentin was added to his medication regimen and has been working well so far. Client reported so far is only downside is he cannot display his emotions towards things that he normally word. Client reported this does not mean in a negative way. Client reported otherwise his fiance has been very supportive in helping him and he is able to function well throughout the day. Evidence of progress towards goal:  client reported medication compliance 7 days per  week.   Suicidal/Homicidal: Nowithout intent/plan  Therapist Response:  Therapist began the appointment asking the client how she has been doing since she was last seen. Therapist used CBT to engage using active listening and positive emotional support. Therapist used CBT to engage and ask the client open ended questions about his recent hospitalization and precedent events leading to. Therapist used CBT to discuss differentiation between manic symptoms and his awareness of anxiety and episodes. Therapist used CBT to engage and discuss utilizing boundaries. Therapist used CBT to reinforce medication compliance. Therapist used CBT ask the client to identify his progress with frequency of use with coping skills with continued practice in his daily activity.    Therapist assigned the client homework to practice self care and medication management.    Plan: Return again in 3 weeks.  Diagnosis: bipolar 1 disorder, mixed, full remission  Collaboration of Care: Patient refused AEB none requested by the client.  Patient/Guardian was advised Release of Information must be obtained prior to any record release in order to collaborate their care with an outside provider. Patient/Guardian was advised if they have not already done so to contact the registration department to sign all necessary forms in order for [place] to release information regarding their care.   Consent: Patient/Guardian gives verbal consent for treatment and assignment of benefits for services provided during this visit. Patient/Guardian expressed understanding and agreed to proceed.   George Rhymes Sricharan [place]omb, LCSW 10/20/2022

## 2022-11-09 ENCOUNTER — Other Ambulatory Visit (HOSPITAL_COMMUNITY): Payer: Self-pay | Admitting: Physician Assistant

## 2022-11-09 DIAGNOSIS — F3178 Bipolar disorder, in full remission, most recent episode mixed: Secondary | ICD-10-CM

## 2022-11-17 ENCOUNTER — Ambulatory Visit (INDEPENDENT_AMBULATORY_CARE_PROVIDER_SITE_OTHER): Payer: No Payment, Other | Admitting: Clinical

## 2022-11-17 DIAGNOSIS — F3178 Bipolar disorder, in full remission, most recent episode mixed: Secondary | ICD-10-CM | POA: Diagnosis not present

## 2022-11-17 NOTE — Progress Notes (Signed)
   THERAPIST PROGRESS NOTE  Session Time: 45 minutes  Participation Level: Active  Behavioral Response: CasualAlertEuthymic  Type of Therapy: Individual Therapy  Treatment Goals addressed: Client will identify cognitive patterns and beliefs that interfere with therapy 1x per session  ProgressTowards Goals: Progressing  Interventions: CBT and Supportive  Summary:  George [name] is a 32 y.o. male who presents for the scheduled appointment oriented times five, appropriately dressed, and friendly. Client denied hallucinations and delusions. Client reported on today he is doing well. Client reported the gabapentin has been working very well for managing his anxiety. Client reported his family came to visit him at his house which was a nice change. Client reported he usually has to go to their house to see them. Client reported they had a nice time. Client reported his mother came to visit him separately. Client reported he thinks his mother misses him more than what she lets on. Client reported he has been thinking about others that tell him he is narcissistic and/ or always has to be right. Client reported he has can acknowledge when is wrong about something but he does make a point to go to lengths to prove his point. Client reported letting things go is something he would have to practice.  Evidence of progress towards goal:  Client reported medication compliance 7 days per week. Client reported 1 deficit of the need to practice communication skills.   Suicidal/Homicidal: Nowithout intent/plan  Therapist Response:  Therapist began the appointment asking the client how he has been doing since last seen. Therapist used CBT to engage using active listening and positive emotional support. Therapist used CBT to engage in asking how his medication compliance is helping his symptoms. Therapist used CBT to engage and ask about stressors with work and how he navigates interacting with  others. Therapist used CBT to manage communication in situations where he feels the need to prove. Therapist used CBT ask the client to identify his progress with frequency of use with coping skills with continued practice in his daily activity.    Therapist assigned the client homework to practice boundaries.   Plan: Return again in 3 weeks.  Diagnosis: bipolar 1 disorder, mixed, full remission  Collaboration of Care: Patient refused AEB none requested by the client.  Patient/Guardian was advised Release of Information must be obtained prior to any record release in order to collaborate their care with an outside provider. Patient/Guardian was advised if they have not already done so to contact the registration department to sign all necessary forms in order for [place] to release information regarding their care.   Consent: Patient/Guardian gives verbal consent for treatment and assignment of benefits for services provided during this visit. Patient/Guardian expressed understanding and agreed to proceed.   [place], LCSW 11/17/2022

## 2022-11-21 ENCOUNTER — Ambulatory Visit (INDEPENDENT_AMBULATORY_CARE_PROVIDER_SITE_OTHER): Payer: No Payment, Other | Admitting: Physician Assistant

## 2022-11-21 ENCOUNTER — Encounter (HOSPITAL_COMMUNITY): Payer: Self-pay | Admitting: Physician Assistant

## 2022-11-21 DIAGNOSIS — F419 Anxiety disorder, unspecified: Secondary | ICD-10-CM

## 2022-11-21 DIAGNOSIS — F1220 Cannabis dependence, uncomplicated: Secondary | ICD-10-CM | POA: Diagnosis not present

## 2022-11-21 DIAGNOSIS — F3178 Bipolar disorder, in full remission, most recent episode mixed: Secondary | ICD-10-CM | POA: Diagnosis not present

## 2022-11-21 NOTE — Progress Notes (Signed)
[place] MD/PA/NP OP Progress Note  11/21/2022 8:03 PM George [name]  MRN:  [id number]  Chief Complaint:  Chief Complaint  Patient presents with   Follow-up   HPI:   George [name] is a 32 year old, African-American male with a past psychiatric history significant for bipolar 1 disorder, anxiety, and cannabis use disorder who presents to [facility] for follow-up and medication management.  Patient was last seen by Rosezetta Schlatter, MD on 10/04/2022.  Patient is currently being managed on the following psychiatric medications:  Gabapentin 300 mg 3 times daily Cogentin 0.5 mg at bedtime Seroquel 200 mg at bedtime Oxcarbazepine 600 mg 2 times daily Hydroxyzine 25 mg 2 times daily as needed  Patient reports that he has been good since his recent discharge from the hospital.  Patient states that prior to his hospitalization, there were multiple things in his life going in every direction and his anxiety was through the roof.  During his hospitalization, patient states that he was placed on gabapentin and states that gabapentin has been helpful.  Patient states that the gabapentin helps him to relax and helps him to change his reaction to issues in his life.  Patient states that his gabapentin helps to provide him awareness of his surroundings.  Patient denies success in the management of his anxiety through the use of hydroxyzine stating that he took multiple hydroxyzine without much relief from his anxiety.  Patient would like to discontinue taking hydroxyzine.  Patient states that his mood is pretty good and states that work has been less stressful.  Once again, patient states that it is a lot easier for him to reconcile situations that upset him normally.  Patient denies depression.  Patient further denies anxiety stating that he is pretty chill.  Although his kids occasionally get on his nerves at times, patient denies any stressors at this time.  Patient  is alert and oriented x 4, calm, cooperative, and fully engaged in conversation during the encounter.  Patient endorses pleasant mood.  Patient denies suicidal or homicidal ideations.  He further denies auditory or visual hallucinations and does not appear to be responding to internal/external stimuli.  Patient endorses fair sleep and receives on average 6 to 7 hours of sleep each night.  Patient endorses good appetite and eats on average 2 solid meals per day.  Patient denies alcohol consumption.  Patient denies tobacco use but does engage in vaping.  Patient endorses illicit drug use in the form of marijuana.  Visit Diagnosis:    ICD-10-CM   1. Bipolar 1 disorder, mixed, full remission ([place])  F31.78     2. Anxiety  F41.9     3. Cannabis use disorder, severe, dependence ([place])  F12.20       Past Psychiatric History:  Bipolar 1 disorder   History of several emergency room visits as well as psychiatry hospitalizations in the past.  Patient was recently [facility] from [facility] -  on 09/23/2022  Past Medical History:  Past Medical History:  Diagnosis Date   ADHD (attention deficit hyperactivity disorder)    Ankle fracture 01/2017   RIGHT ANKLE   Anxiety    Bipolar disorder ([place])    Depression    Sleep disorder 04/30/2014    Past Surgical History:  Procedure Laterality Date   NO PAST SURGERIES     ORIF ANKLE FRACTURE Right 01/31/2017   Procedure: OPEN REDUCTION INTERNAL FIXATION (ORIF) RIGHT TRIMALLEOLAR ANKLE FRACTURE;  Surgeon: Leandrew Koyanagi, MD;  Location: [place];  Service: Orthopedics;  Laterality: Right;    Family Psychiatric History:  No reported family history of psychiatric illness   Family History:  Family History  Problem Relation Age of Onset   Cancer Father    Diabetes Maternal Grandmother    Alzheimer's disease Paternal Grandmother    Heart failure Paternal Grandmother    Heart failure Paternal Grandfather    Heart failure Maternal Grandfather     Social  History:  Social History   Socioeconomic History   Marital status: Single    Spouse name: Not on file   Number of children: Not on file   Years of education: Not on file   Highest education level: Not on file  Occupational History   Not on file  Tobacco Use   Smoking status: Every Day    Packs/day: 1.00    Years: 10.00    Total pack years: 10.00    Types: Cigarettes   Smokeless tobacco: Never   Tobacco comments:    off and on  Substance and Sexual Activity   Alcohol use: Yes   Drug use: Yes    Types: Marijuana, Cocaine    Comment: heroin   Sexual activity: Not on file    Comment: Unknown  Other Topics Concern   Not on file  Social History Narrative   Not on file   Social Determinants of Health   Financial Resource Strain: Not on file  Food Insecurity: Not on file  Transportation Needs: Not on file  Physical Activity: Not on file  Stress: Not on file  Social Connections: Not on file    Allergies:  Allergies  Allergen Reactions   Amoxicillin Anaphylaxis, Itching, Swelling and Rash    "throat started to close up" Has patient had a PCN reaction causing immediate rash, facial/tongue/throat swelling, SOB or lightheadedness with hypotension: Yes Has patient had a PCN reaction causing severe rash involving mucus membranes or skin necrosis: Yes Has patient had a PCN reaction that required hospitalization No Has patient had a PCN reaction occurring within the last 10 years: No If all of the above answers are "NO", then may proceed with Cephalosporin use.    Penicillins Anaphylaxis, Itching, Rash and Other (See Comments)    Has patient had a PCN reaction causing immediate rash, facial/tongue/throat swelling, SOB or lightheadedness with hypotension: unsure Has patient had a PCN reaction causing severe rash involving mucus membranes or skin necrosis: Unsure Has patient had a PCN reaction that required hospitalization Unsure Has patient had a PCN reaction occurring within  the last 10 years: No If all of the above answers are "NO", then may proceed with Cephalosporin use.    Metabolic Disorder Labs: Lab Results  Component Value Date   HGBA1C 6.2 (H) 12/10/2015   MPG 131 12/10/2015   Lab Results  Component Value Date   PROLACTIN 28.2 (H) 12/10/2015   Lab Results  Component Value Date   CHOL 119 12/10/2015   TRIG 91 12/10/2015   HDL 34 (L) 12/10/2015   CHOLHDL 3.5 12/10/2015   VLDL 18 12/10/2015   LDLCALC 67 12/10/2015   Lab Results  Component Value Date   TSH 1.744 12/10/2015    Therapeutic Level Labs: No results found for: "LITHIUM" No results found for: "VALPROATE" No results found for: "CBMZ"  Current Medications: Current Outpatient Medications  Medication Sig Dispense Refill   benztropine (COGENTIN) 0.5 MG tablet TAKE 1 TABLET (0.5 MG TOTAL) BY MOUTH AT BEDTIME. 30 tablet 2  gabapentin (NEURONTIN) 300 MG capsule Take 1 capsule (300 mg total) by mouth 3 (three) times daily. 90 capsule 2   GINSENG PO Take 1 tablet by mouth daily.     hydrOXYzine (ATARAX) 25 MG tablet Take 1 tablet (25 mg total) by mouth 3 (three) times daily as needed for anxiety. 90 tablet 2   OVER THE COUNTER MEDICATION Take 1 tablet by mouth daily. Ginkgo Biloba     OVER THE COUNTER MEDICATION Take 2 tablets by mouth daily. L arginine PO     oxcarbazepine (TRILEPTAL) 600 MG tablet TAKE 1 TABLET (600 MG TOTAL) BY MOUTH 2 (TWO) TIMES DAILY. 60 tablet 2   QUEtiapine (SEROQUEL) 200 MG tablet TAKE 1 TABLET (200 MG TOTAL) BY MOUTH AT BEDTIME. 30 tablet 2   No current facility-administered medications for this visit.     Musculoskeletal: Strength & Muscle Tone: within normal limits Gait & Station: normal Patient leans: N/A  Psychiatric Specialty Exam: Review of Systems  Psychiatric/Behavioral:  Negative for decreased concentration, dysphoric mood, hallucinations, self-injury, sleep disturbance and suicidal ideas. The patient is not nervous/anxious and is not  hyperactive.     There were no vitals taken for this visit.There is no height or weight on file to calculate BMI.  General Appearance: Casual  Eye Contact:  Good  Speech:  Clear and Coherent and Normal Rate  Volume:  Normal  Mood:  Euthymic  Affect:  Appropriate and Congruent  Thought Process:  Coherent and Descriptions of Associations: Intact  Orientation:  Full (Time, Place, and Person)  Thought Content: WDL   Suicidal Thoughts:  No  Homicidal Thoughts:  No  Memory:  Immediate;   Good Recent;   Good Remote;   Good  Judgement:  Fair  Insight:  Fair  Psychomotor Activity:  Normal  Concentration:  Concentration: Good and Attention Span: Good  Recall:  Good  Fund of Knowledge: Good  Language: Good  Akathisia:  No  Handed:  Right  AIMS (if indicated): not done  Assets:  Communication Skills Desire for Improvement Housing Intimacy Physical Health Resilience Social Support  ADL's:  Intact  Cognition: WNL  Sleep:  Fair   Screenings: AIMS    Flowsheet Row [facility]  AIMS Total Score 0      AUDIT    Flowsheet Row [facility]  Alcohol Use Disorder Identification Test Final Score (AUDIT) 0      GAD-7    Flowsheet Row Clinical Support from 11/21/2022 in [facility] Counselor from 07/31/2022 in [facility] Clinical Support from 06/27/2022 in [facility] Office Visit from 03/29/2022 in [facility] Video Visit from 12/30/2021 in [facility]  Total GAD-7 Score 0 2 0 0 0      PHQ2-9    Flowsheet Row Clinical Support from 11/21/2022 in [facility] Clinical Support from 10/04/2022 in [facility] Counselor from 07/31/2022 in [facility] Clinical Support from 06/27/2022 in [facility] Counselor from 06/06/2022 in [facility]  PHQ-2 Total Score 0 0 0 0 0  PHQ-9 Total Score -- -- 0 -- 0      Flowsheet Row Clinical Support from 11/21/2022 in [facility] Support from 10/04/2022 in [facility]  Center ED from 09/14/2022 in [facility]  C-SSRS RISK CATEGORY No Risk Error: Q7 should not be populated when Q6 is No High Risk        Assessment and Plan:   [name] is a 32 year old, African-American male with a past psychiatric history significant for bipolar 1 disorder, anxiety, and cannabis use disorder who presents to [facility] for follow-up and medication management.  Patient reports no issues or concerns regarding his current medication regimen.  Since taking gabapentin, patient states that his depression and anxiety have been manageable and he feels more relaxed.  Patient does not want to continue taking hydroxyzine for the management of his anxiety and is comfortable with continuing to take gabapentin.  Patient to continue taking medications as prescribed.  Collaboration of Care: Collaboration of Care: Medication Management AEB provider managing patient's psychiatric medications, Psychiatrist AEB patient being followed by mental health provider, and Referral or follow-up with counselor/therapist AEB patient being seen by a licensed clinical social worker at this facility  Patient/Guardian was advised Release of Information must be obtained prior to any record release in order to collaborate their care with an outside provider. Patient/Guardian was advised if they have not already done so to contact the registration department to sign all necessary forms in order for [place] to release information regarding their  care.   Consent: Patient/Guardian gives verbal consent for treatment and assignment of benefits for services provided during this visit. Patient/Guardian expressed understanding and agreed to proceed.   1. Bipolar 1 disorder, mixed, full remission (HCC) Patient to continue taking Seroquel 200 mg at bedtime for the management of his bipolar 1 disorder Patient to continue taking benztropine 0.5 mg at bedtime for the management of his bipolar 1 disorder Patient to continue taking oxcarbazepine 600 mg 2 times daily for the management of his bipolar 1 disorder  2. Anxiety Patient to continue taking gabapentin 300 mg 3 times daily for the management of his anxiety  3. Cannabis use disorder, severe, dependence (HCC) Patient education provided on cannabis use  Patient to follow-up in 2 months Provider spent a total of 23 minutes with the patient/reviewing patient's chart  Meta Hatchet, PA 11/21/2022, 8:03 PM

## 2022-11-27 ENCOUNTER — Ambulatory Visit (INDEPENDENT_AMBULATORY_CARE_PROVIDER_SITE_OTHER): Payer: No Payment, Other | Admitting: Clinical

## 2022-11-27 DIAGNOSIS — F3178 Bipolar disorder, in full remission, most recent episode mixed: Secondary | ICD-10-CM | POA: Diagnosis not present
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
## 2022-11-29 ENCOUNTER — Encounter (HOSPITAL_COMMUNITY): Payer: No Payment, Other | Admitting: Student

## 2022-12-18 ENCOUNTER — Ambulatory Visit (INDEPENDENT_AMBULATORY_CARE_PROVIDER_SITE_OTHER): Payer: No Payment, Other | Admitting: Clinical

## 2022-12-18 ENCOUNTER — Encounter (HOSPITAL_COMMUNITY): Payer: Self-pay

## 2022-12-18 DIAGNOSIS — F3178 Bipolar disorder, in full remission, most recent episode mixed: Secondary | ICD-10-CM | POA: Diagnosis not present

## 2022-12-18 NOTE — Progress Notes (Signed)
THERAPIST PROGRESS NOTE  Session Time: 40 minutes  Participation Level: Active  Behavioral Response: CasualAlertEuthymic  Type of Therapy: Individual Therapy  Treatment Goals addressed: client will identify cognitive patterns and beliefs that interfere with therapy 1x per session  ProgressTowards Goals: Progressing  Interventions: CBT and Supportive  Summary:  George [name] is a 32 y.o. male who presents for the scheduled appointment oriented times five, appropriately dressed, and friendly. Client denied hallucinations and delusions. Client report he is feeling better than he was at the last appointment. Client reported he is not sure why he was so tense and responded how he did to the prior events that happened. 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 Client reported he thinks there are stressors coming from higher ups that are affecting his boss. Client reported otherwise things in his relationship and with his family are going well. Client reported he recently celebrated his anniversary with his fiance and her birthday. Client reported reported no other complaints. Client reported he wants to continue working on staying with medication compliance, managing anxiety symptoms, and sticking to a good self care routine. Evidence of progress towards goal:  client GAD7 and PHQ9 score is a 0.    12/18/2022   12:00 PM 11/21/2022   10:51 AM 07/31/2022   10:35 AM 06/27/2022   11:24 AM  GAD 7 : Generalized Anxiety Score  Nervous, Anxious, on Edge 0 0 0 0  Control/stop worrying 0 0 0 0  Worry too much - different things 0 0 1 0  Trouble relaxing 0 0 1 0  Restless 0 0 0 0  Easily annoyed or irritable 0 0 0 0  Afraid - awful might happen 0 0 0 0  Total GAD 7 Score 0 0 2 0  Anxiety Difficulty Not difficult at all Not difficult at all Not difficult at all Not difficult at all     [facility] Counselor from  12/18/2022 in [facility]  PHQ-9 Total Score 0         Suicidal/Homicidal: Nowithout intent/plan  Therapist Response:  Therapist began the appointment asking the client how he has been doing since last seen. Therapist used CBT to engage using active listening and positive emotional support. Therapist used CBT to engage and ask the client to describe stressors if any and how he has coped with them. Therapist used CBT to engage and reinforce the clients use of challenging and implementing new ways to communicate and respond in stressful situations. Therapist used CBT to discuss using assertive communication and brainstorming balanced thoughts about challenging situations. Therapist used CBT ask the client to identify his progress with frequency of use with coping skills with continued practice in his daily activity.    Therapist assigned the client homework to practice self care.   Plan: Return again in 3 weeks.  Diagnosis: bipolar 1 disorder, mixed full remission  Collaboration of Care: Patient refused AEB none requested by the client.  Patient/Guardian was advised Release of Information must be obtained prior to any record release in order to collaborate their care with an outside provider. Patient/Guardian was advised if they have not already done so to contact the registration department to sign all necessary forms in order for [place] to release information regarding their care.   Consent: Patient/Guardian gives verbal consent for treatment and assignment of benefits for services provided during this visit. Patient/Guardian  expressed understanding and agreed to proceed.   [place], LCSW 12/18/2022

## 2023-01-02 ENCOUNTER — Ambulatory Visit (INDEPENDENT_AMBULATORY_CARE_PROVIDER_SITE_OTHER): Payer: No Payment, Other | Admitting: Clinical

## 2023-01-02 DIAGNOSIS — F3178 Bipolar disorder, in full remission, most recent episode mixed: Secondary | ICD-10-CM | POA: Diagnosis not present

## 2023-01-02 NOTE — Progress Notes (Signed)
   THERAPIST PROGRESS NOTE  Session Time: 45 minutes  Participation Level: Active  Behavioral Response: CasualAlertEuthymic  Type of Therapy: Individual Therapy  Treatment Goals addressed: client will identify cognitive patterns and beliefs that interfere with therapy 1x per session  ProgressTowards Goals: Progressing  Interventions: CBT and Supportive  Summary:  George [name] is a 32 y.o. male who presents for the scheduled appointment oriented times five, appropriately dressed, and friendly. Client denied hallucinations and delusions. Client reported he is doing pretty well today. Client reported he has had a challenging situation with his fiance. Client reported they had an argument which escalated to using profanity. Client reported she has been having a lot of stress from shared custody with her 2 girls father. Client reported when she is upset she becomes very mean and rude. Client reported he has acknowledged he needs to work on thinking he has to react in a way of showing he can hurt the other persons feelings. Client asked "how do I not stoop to her level when she gets me upset". Client reported he thinks she has had some trauma from her past that she has not addressed which contributes to her behavior when she is mad. Client reported overall the relationship is good. Evidence of progress towards goal:  client identified 1 cognitive pattern which negatively impacts how he communicates when he is triggered to anger.   Suicidal/Homicidal: Nowithout intent/plan  Therapist Response:  Therapist began the appointment asking the client how he has been doing since last seen. Therapist used CBT to engage using active listening and positive emotional support. Therapist used CBT to engage asking the client to identify the source of his mood. Therapist used CBT to discuss communication and problem solving skills. Therapist used CBT ask the client to identify his progress with frequency  of use with coping skills with continued practice in his daily activity.    Therapist assigned the client homework to practice the communication skills discussed for himself and reinforce with his partner. Client was scheduled for next appointment.   Plan: Return again in 3 weeks.  Diagnosis: bipolar 1 disorder, mixed, full remission  Collaboration of Care: Patient refused AEB none requested by the client.  Patient/Guardian was advised Release of Information must be obtained prior to any record release in order to collaborate their care with an outside provider. Patient/Guardian was advised if they have not already done so to contact the registration department to sign all necessary forms in order for [place] to release information regarding their care.   Consent: Patient/Guardian gives verbal consent for treatment and assignment of benefits for services provided during this visit. Patient/Guardian expressed understanding and agreed to proceed.   [place], LCSW 01/02/2023

## 2023-01-18 ENCOUNTER — Encounter (HOSPITAL_COMMUNITY): Payer: Self-pay | Admitting: Physician Assistant

## 2023-01-18 ENCOUNTER — Ambulatory Visit (INDEPENDENT_AMBULATORY_CARE_PROVIDER_SITE_OTHER): Payer: No Payment, Other | Admitting: Physician Assistant

## 2023-01-18 VITALS — BP 105/75 | HR 63 | Ht 70.0 in | Wt 242.0 lb

## 2023-01-18 DIAGNOSIS — F419 Anxiety disorder, unspecified: Secondary | ICD-10-CM

## 2023-01-18 DIAGNOSIS — F3178 Bipolar disorder, in full remission, most recent episode mixed: Secondary | ICD-10-CM

## 2023-01-18 DIAGNOSIS — F1220 Cannabis dependence, uncomplicated: Secondary | ICD-10-CM | POA: Diagnosis not present

## 2023-01-18 MED ORDER — QUETIAPINE FUMARATE 200 MG PO TABS: 200.0000 mg | ORAL_TABLET | Freq: Every day | ORAL | 2 refills | Status: DC

## 2023-01-18 MED ORDER — GABAPENTIN 300 MG PO CAPS: 300.0000 mg | ORAL_CAPSULE | Freq: Three times a day (TID) | ORAL | 2 refills | Status: DC

## 2023-01-18 MED ORDER — OXCARBAZEPINE 600 MG PO TABS: 600.0000 mg | ORAL_TABLET | Freq: Two times a day (BID) | ORAL | 2 refills | Status: DC

## 2023-01-18 MED ORDER — BENZTROPINE MESYLATE 0.5 MG PO TABS: 0.5000 mg | ORAL_TABLET | Freq: Every day | ORAL | 2 refills | Status: DC

## 2023-01-18 NOTE — Progress Notes (Signed)
[facility] MD/PA/NP OP Progress Note  01/18/2023 10:01 PM George [name]  MRN:  [id number]  Chief Complaint:  Chief Complaint  Patient presents with   Medication Management   Follow-up   HPI:   George [name] is a 32 year old, African-American male with a past psychiatric history significant for bipolar 1 disorder, anxiety, and cannabis use disorder who presents to [facility] for follow-up and medication management.  Patient is currently being managed on the following psychiatric medications:  Gabapentin 300 mg 3 times daily Cogentin 0.5 mg at bedtime Seroquel 200 mg at bedtime Oxcarbazepine 600 mg 2 times daily  Patient reports no issues or concerns regarding his current medication regimen.  Patient denies experiencing any adverse side effects from his current medication regimen and is requesting refills on his medications following the conclusion of the encounter.  Patient denies depression or anxiety.  Patient further denies any stressors at this time.  Patient reports that everything has been consistent since taking his medications.  Patient states that he is able to work through his problems/situations without losing sight.  Patient notices a beneficial difference since taking his medications and will continue to take them as such.  Patient is alert and oriented x 4, calm, cooperative, and fully engaged in conversation during the encounter.  Patient endorses being in a pretty good mood.  Patient denies suicidal or homicidal ideations.  He further denies auditory or visual hallucinations and does not appear to be responding to internal/external stimuli.  Patient endorses good sleep and receives on average 6 to 7 hours of sleep each night.  Patient endorses good appetite and eats on average 2 meals per day as well as snacking a lot.  Patient denies alcohol consumption.  Patient denies tobacco use but does engage in vaping.  Patient endorses illicit drug  use in the form of marijuana.  Visit Diagnosis:    ICD-10-CM   1. Anxiety  F41.9 gabapentin (NEURONTIN) 300 MG capsule    2. Bipolar 1 disorder, mixed, full remission (HCC)  F31.78 oxcarbazepine (TRILEPTAL) 600 MG tablet    QUEtiapine (SEROQUEL) 200 MG tablet    benztropine (COGENTIN) 0.5 MG tablet    Lipid Profile    Comprehensive Metabolic Panel (CMET)    HgB A1c    3. Cannabis use disorder, severe, dependence ([place])  F12.20       Past Psychiatric History:  Bipolar 1 disorder   History of several emergency room visits as well as psychiatry hospitalizations in the past.  Patient was recently discharged from [facility] on 09/23/2022  Past Medical History:  Past Medical History:  Diagnosis Date   ADHD (attention deficit hyperactivity disorder)    Ankle fracture 01/2017   RIGHT ANKLE   Anxiety    Bipolar disorder ([place])    Depression    Sleep disorder 04/30/2014    Past Surgical History:  Procedure Laterality Date   NO PAST SURGERIES     ORIF ANKLE FRACTURE Right 01/31/2017   Procedure: OPEN REDUCTION INTERNAL FIXATION (ORIF) RIGHT TRIMALLEOLAR ANKLE FRACTURE;  Surgeon: Leandrew Koyanagi, MD;  Location: [place];  Service: Orthopedics;  Laterality: Right;    Family Psychiatric History:  No reported family history of psychiatric illness   Family History:  Family History  Problem Relation Age of Onset   Cancer Father    Diabetes Maternal Grandmother    Alzheimer's disease Paternal Grandmother    Heart failure Paternal Grandmother    Heart failure Paternal Grandfather  Heart failure Maternal Grandfather     Social History:  Social History   Socioeconomic History   Marital status: Single    Spouse name: Not on file   Number of children: Not on file   Years of education: Not on file   Highest education level: Not on file  Occupational History   Not on file  Tobacco Use   Smoking status: Every Day    Packs/day: 1.00    Years: 10.00    Additional pack years: 0.00     Total pack years: 10.00    Types: Cigarettes   Smokeless tobacco: Never   Tobacco comments:    off and on  Substance and Sexual Activity   Alcohol use: Yes   Drug use: Yes    Types: Marijuana, Cocaine    Comment: heroin   Sexual activity: Not on file    Comment: Unknown  Other Topics Concern   Not on file  Social History Narrative   Not on file   Social Determinants of Health   Financial Resource Strain: Not on file  Food Insecurity: Not on file  Transportation Needs: Not on file  Physical Activity: Not on file  Stress: Not on file  Social Connections: Not on file    Allergies:  Allergies  Allergen Reactions   Amoxicillin Anaphylaxis, Itching, Swelling and Rash    "throat started to close up" Has patient had a PCN reaction causing immediate rash, facial/tongue/throat swelling, SOB or lightheadedness with hypotension: Yes Has patient had a PCN reaction causing severe rash involving mucus membranes or skin necrosis: Yes Has patient had a PCN reaction that required hospitalization No Has patient had a PCN reaction occurring within the last 10 years: No If all of the above answers are "NO", then may proceed with Cephalosporin use.    Penicillins Anaphylaxis, Itching, Rash and Other (See Comments)    Has patient had a PCN reaction causing immediate rash, facial/tongue/throat swelling, SOB or lightheadedness with hypotension: unsure Has patient had a PCN reaction causing severe rash involving mucus membranes or skin necrosis: Unsure Has patient had a PCN reaction that required hospitalization Unsure Has patient had a PCN reaction occurring within the last 10 years: No If all of the above answers are "NO", then may proceed with Cephalosporin use.    Metabolic Disorder Labs: Lab Results  Component Value Date   HGBA1C 6.2 (H) 12/10/2015   MPG 131 12/10/2015   Lab Results  Component Value Date   PROLACTIN 28.2 (H) 12/10/2015   Lab Results  Component Value Date    CHOL 119 12/10/2015   TRIG 91 12/10/2015   HDL 34 (L) 12/10/2015   CHOLHDL 3.5 12/10/2015   VLDL 18 12/10/2015   LDLCALC 67 12/10/2015   Lab Results  Component Value Date   TSH 1.744 12/10/2015    Therapeutic Level Labs: No results found for: "LITHIUM" No results found for: "VALPROATE" No results found for: "CBMZ"  Current Medications: Current Outpatient Medications  Medication Sig Dispense Refill   benztropine (COGENTIN) 0.5 MG tablet Take 1 tablet (0.5 mg total) by mouth at bedtime. 30 tablet 2   gabapentin (NEURONTIN) 300 MG capsule Take 1 capsule (300 mg total) by mouth 3 (three) times daily. 90 capsule 2   GINSENG PO Take 1 tablet by mouth daily.     OVER THE COUNTER MEDICATION Take 1 tablet by mouth daily. Ginkgo Biloba     OVER THE COUNTER MEDICATION Take 2 tablets by mouth daily. L arginine  PO     oxcarbazepine (TRILEPTAL) 600 MG tablet Take 1 tablet (600 mg total) by mouth 2 (two) times daily. 60 tablet 2   QUEtiapine (SEROQUEL) 200 MG tablet Take 1 tablet (200 mg total) by mouth at bedtime. 30 tablet 2   No current facility-administered medications for this visit.     Musculoskeletal: Strength & Muscle Tone: within normal limits Gait & Station: normal Patient leans: N/A  Psychiatric Specialty Exam: Review of Systems  Psychiatric/Behavioral:  Negative for decreased concentration, dysphoric mood, hallucinations, self-injury, sleep disturbance and suicidal ideas. The patient is not nervous/anxious and is not hyperactive.     Blood pressure 105/75, pulse 63, height 5\' 10"  (1.778 m), weight 242 lb (109.8 kg), SpO2 100 %.Body mass index is 34.72 kg/m.  General Appearance: Casual  Eye Contact:  Good  Speech:  Clear and Coherent and Normal Rate  Volume:  Normal  Mood:  Euthymic  Affect:  Appropriate and Congruent  Thought Process:  Coherent and Descriptions of Associations: Intact  Orientation:  Full (Time, Place, and Person)  Thought Content: WDL   Suicidal  Thoughts:  No  Homicidal Thoughts:  No  Memory:  Immediate;   Good Recent;   Good Remote;   Good  Judgement:  Good  Insight:  Good  Psychomotor Activity:  Normal  Concentration:  Concentration: Good and Attention Span: Good  Recall:  Good  Fund of Knowledge: Good  Language: Good  Akathisia:  No  Handed:  Right  AIMS (if indicated): not done  Assets:  Communication Skills Desire for Improvement Housing Intimacy Physical Health Resilience Social Support Vocational/Educational  ADL's:  Intact  Cognition: WNL  Sleep:  Good   Screenings: AIMS    Flowsheet Row Admission (Discharged) from 12/08/2015 in [place] Total Score 0      AUDIT    Flowsheet Row Admission (Discharged) from 12/08/2015 in [place] [facility]  Alcohol Use Disorder Identification Test Final Score (AUDIT) 0      GAD-7    Flowsheet Row Clinical Support from 01/18/2023 in [facility] Counselor from 12/18/2022 in [place] from 11/21/2022 in [facility] Counselor from 07/31/2022 in [place] from 06/27/2022 in [facility]  Total GAD-7 Score 0 0 0 2 0      PHQ2-9    [place] from 01/18/2023 in [facility] Counselor from 12/18/2022 in [place] from 11/21/2022 in [place] from 10/04/2022 in [facility] Counselor from 07/31/2022 in [facility]  PHQ-2 Total Score 0 0 0 0 0  PHQ-9 Total Score -- 0 -- -- 0      Flowsheet Row Clinical Support from 01/18/2023 in [place] from 11/21/2022 in [place] from 10/04/2022 in [place] No Risk No Risk Error: Q7 should not be populated when Q6 is No        Assessment and Plan:   George [name] is a 32 year old, African-American male with a past psychiatric history significant for bipolar 1 disorder, anxiety, and cannabis use disorder who presents to [facility] for follow-up and medication management.  Patient reports no issues  or concerns regarding his current medication regimen.  Patient denies the need for dosage adjustments at this time and is questioning refills following the conclusion of the encounter.  Patient denies depression or anxiety and further denies any new stressors at this time.  Patient will continue to take his medications as prescribed.  Patient's medications to be e-prescribed to pharmacy of choice.  Provider to obtain the following labs from the patient: Lipid level, CMP, and hemoglobin A1c.  Collaboration of Care: Collaboration of Care: Medication Management AEB provider managing patient's psychiatric medications, Psychiatrist AEB patient being followed by mental health provider, and Referral or follow-up with counselor/therapist AEB patient being seen by a licensed clinical social worker at this facility  Patient/Guardian was advised Release of Information must be obtained prior to any record release in order to collaborate their care with an outside provider. Patient/Guardian was advised if they have not already done so to contact the registration department to sign all necessary forms in order for [place] to release information regarding their care.   Consent: Patient/Guardian gives verbal consent for treatment and assignment of benefits for services provided during this visit. Patient/Guardian expressed understanding and agreed to proceed.   1. Bipolar 1 disorder, mixed, full remission (HCC)  - oxcarbazepine  (TRILEPTAL) 600 MG tablet; Take 1 tablet (600 mg total) by mouth 2 (two) times daily.  Dispense: 60 tablet; Refill: 2 - QUEtiapine (SEROQUEL) 200 MG tablet; Take 1 tablet (200 mg total) by mouth at bedtime.  Dispense: 30 tablet; Refill: 2 - benztropine (COGENTIN) 0.5 MG tablet; Take 1 tablet (0.5 mg total) by mouth at bedtime.  Dispense: 30 tablet; Refill: 2 - Lipid Profile; Future - Comprehensive Metabolic Panel (CMET); Future - HgB A1c; Future  2. Anxiety  - gabapentin (NEURONTIN) 300 MG capsule; Take 1 capsule (300 mg total) by mouth 3 (three) times daily.  Dispense: 90 capsule; Refill: 2  3. Cannabis use disorder, severe, dependence ([place])  Patient to follow-up in 2 months Provider spent a total of 14 minutes with the patient/reviewing patient's chart  Malachy Mood, PA 01/18/2023, 10:01 PM

## 2023-01-31 ENCOUNTER — Ambulatory Visit (INDEPENDENT_AMBULATORY_CARE_PROVIDER_SITE_OTHER): Payer: No Payment, Other | Admitting: Clinical

## 2023-01-31 DIAGNOSIS — F3178 Bipolar disorder, in full remission, most recent episode mixed: Secondary | ICD-10-CM

## 2023-01-31 NOTE — Progress Notes (Unsigned)
   THERAPIST PROGRESS NOTE  Session Time: 45 minutes  Participation Level: Active  Behavioral Response: CasualAlertIrritable  Type of Therapy: Individual Therapy  Treatment Goals addressed: client will identify cognitive patterns and beliefs that interfere with therapy once per session  ProgressTowards Goals: Progressing  Interventions: CBT and Supportive  Summary:  George [name] is a 32 y.o. male who presents for the scheduled appointment oriented times five, appropriately dressed, and friendly. Client denied hallucinations and delusions. Client reported he is doing good. Client reported work is still the same. Client reported he has gotten rubbed the wrong way by comments made by coworkers. Client reported he knows people are going to say things the wrong way but he recently had a issue with someone at work making a statement he can't related because he does not have biological children. Client reported overall with taking medication if he finds himself in a conflict questioning himself if he overreacted in a "normal way or a mental health way". Client reported it is hard for him to sit back and listen to others comments that could be considered ignorance.  Client reported he feels like it's proof that what he went through as it relates to things that occurred within his family didn't matter to anyone. Client reported he didn't feel loved or supported over the years when he needed them most. Client reported he feels angered by witnessing his fathers actions and not having the support of his mother emotionally. Client reported he thinks he rehash's things because his family denies or addresses things. Evidence of progress towards goal:  client reported 1 cognitive pattern that relates to his communication and how he reacts to conflict.   Suicidal/Homicidal: Nowithout intent/plan  Therapist Response:  Therapist began the appointment asking the client how he has been doing since last  seen. Therapist used CBT to engage in using active listening and positive emotional support. Therapist used CBT to give the client time to discuss his thoughts and feelings pertaining to stressors and interpersonal relationships, family, and work. Therapist used CBT to engage and discuss normalizing his feelings and steps for acceptance and forgiveness. Therapist used CBT ask the client to identify his progress with frequency of use with coping skills with continued practice in his daily activity.    Therapist assigned the client homework to practice self care.   Plan: Return again in 3 weeks.  Diagnosis: bipolar 1 disorder, mixed, full remission  Collaboration of Care: Patient refused AEB none requested by the client.  Patient/Guardian was advised Release of Information must be obtained prior to any record release in order to collaborate their care with an outside provider. Patient/Guardian was advised if they have not already done so to contact the registration department to sign all necessary forms in order for [place] to release information regarding their care.   Consent: Patient/Guardian gives verbal consent for treatment and assignment of benefits for services provided during this visit. Patient/Guardian expressed understanding and agreed to proceed.   [place], LCSW 01/31/2023

## 2023-02-05 ENCOUNTER — Ambulatory Visit (HOSPITAL_COMMUNITY): Payer: No Payment, Other | Admitting: Clinical

## 2023-02-14 ENCOUNTER — Ambulatory Visit (HOSPITAL_COMMUNITY): Payer: No Payment, Other | Admitting: Clinical

## 2023-02-28 ENCOUNTER — Other Ambulatory Visit (HOSPITAL_COMMUNITY): Payer: Self-pay | Admitting: Physician Assistant

## 2023-02-28 DIAGNOSIS — F3178 Bipolar disorder, in full remission, most recent episode mixed: Secondary | ICD-10-CM

## 2023-02-28 NOTE — Progress Notes (Signed)
Labs orders for the patient.

## 2023-02-28 NOTE — Addendum Note (Signed)
Addended by: Meta Hatchet on: 02/28/2023 10:01 AM   Modules accepted: Orders

## 2023-03-22 ENCOUNTER — Ambulatory Visit (INDEPENDENT_AMBULATORY_CARE_PROVIDER_SITE_OTHER): Payer: No Payment, Other | Admitting: Physician Assistant

## 2023-03-22 VITALS — BP 120/77 | HR 57 | Temp 98.2°F | Ht 71.0 in | Wt 257.0 lb

## 2023-03-22 DIAGNOSIS — F1220 Cannabis dependence, uncomplicated: Secondary | ICD-10-CM | POA: Diagnosis not present

## 2023-03-22 DIAGNOSIS — F419 Anxiety disorder, unspecified: Secondary | ICD-10-CM | POA: Diagnosis not present

## 2023-03-22 DIAGNOSIS — F3178 Bipolar disorder, in full remission, most recent episode mixed: Secondary | ICD-10-CM | POA: Diagnosis not present

## 2023-03-22 MED ORDER — BENZTROPINE MESYLATE 0.5 MG PO TABS
0.5000 mg | ORAL_TABLET | Freq: Every day | ORAL | 2 refills | Status: DC
Start: 2023-03-22 — End: 2023-05-25

## 2023-03-22 MED ORDER — GABAPENTIN 300 MG PO CAPS
300.0000 mg | ORAL_CAPSULE | Freq: Three times a day (TID) | ORAL | 2 refills | Status: DC
Start: 2023-03-22 — End: 2023-05-25

## 2023-03-22 MED ORDER — QUETIAPINE FUMARATE 200 MG PO TABS: 200.0000 mg | ORAL_TABLET | Freq: Every day | ORAL | 2 refills | Status: DC

## 2023-03-22 MED ORDER — OXCARBAZEPINE 600 MG PO TABS
600.0000 mg | ORAL_TABLET | Freq: Two times a day (BID) | ORAL | 2 refills | Status: DC
Start: 2023-03-22 — End: 2023-05-25

## 2023-03-22 NOTE — Progress Notes (Signed)
BH MD/PA/NP OP Progress Note  03/22/2023 12:27 PM George [name]  MRN:  [id number]  Chief Complaint:  Chief Complaint  Patient presents with   Follow-up   Medication Refill   HPI:   George [name] is a 32 year old, African-American male with a past psychiatric history significant for bipolar 1 disorder, anxiety, and cannabis use disorder who presents to [facility] for follow-up and medication management.  Patient is currently being managed on the following psychiatric medications:  Gabapentin 300 mg 3 times daily Cogentin 0.5 mg at bedtime Seroquel 200 mg at bedtime Oxcarbazepine 600 mg 2 times daily  Patient presents today encounter stating that he is doing well.  Provider discussed the patient had received labs for the assessment of his lipids, hemoglobin A1c, complete metabolic panel, and CBC with differential.  Patient reported that he has not had his labs drawn due to the cost to have his labs obtained and reviewed.  Provider discussed obtaining patient's labs in-house.  Patient vocalized understanding.  Patient reports no issues or concerns regarding his current medication regimen.  Patient denies experiencing any adverse side effects and further denies the need for dosage adjustments at this time.  He reports that he occasionally forgets to take his gabapentin on occasion, especially in the morning and afternoon.  He explains that he will get irritable at times if he has not taken his gabapentin.  He further adds that he will take hydroxyzine on occasion to avoid becoming overly anxious.  He reports that the use of gabapentin allows him to avoid getting irritable and anxious as well as handle situations better without becoming agitated.  Patient reports that if he does not take his gabapentin regularly, then he may get easily triggered and end up doing something to hurt someone else's feelings, which in turn will cause him to feel  guilty.  Patient denies depression stating that he occasionally has his bad days but states that those days are not consistent.  Patient endorses manageable anxiety especially when taking his gabapentin.  Patient is alert and oriented x 4, calm, cooperative, and fully engaged in conversation during the encounter.  Patient endorses good mood.  Patient denies suicidal or homicidal ideation.  He further denies auditory or visual hallucinations and does not appear to be responding to internal/external stimuli.  Patient endorses good sleep and receives on average 6 to 7 hours of sleep per night.  He reports that he naps during the day roughly 3 to 4 days out of the week.  Patient endorses good appetite and eats on average 2 meals a day along with snacking.  Patient denies alcohol consumption.  He denies tobacco use but does engage in vaping.  He reports that he will occasionally indulge in a Black and Mild every now and then.  Patient endorses illicit drug use in the form of marijuana.  Visit Diagnosis:    ICD-10-CM   1. Cannabis use disorder, severe, dependence (HCC)  F12.20     2. Bipolar 1 disorder, mixed, full remission (HCC)  F31.78 oxcarbazepine (TRILEPTAL) 600 MG tablet    QUEtiapine (SEROQUEL) 200 MG tablet    benztropine (COGENTIN) 0.5 MG tablet    3. Anxiety  F41.9 gabapentin (NEURONTIN) 300 MG capsule      Past Psychiatric History:  Bipolar 1 disorder   History of several emergency room visits as well as psychiatry hospitalizations in the past.  Patient was recently discharged from [facility] on 09/23/2022  Past Medical  History:  Past Medical History:  Diagnosis Date   ADHD (attention deficit hyperactivity disorder)    Ankle fracture 01/2017   RIGHT ANKLE   Anxiety    Bipolar disorder (HCC)    Depression    Sleep disorder 04/30/2014    Past Surgical History:  Procedure Laterality Date   NO PAST SURGERIES     ORIF ANKLE FRACTURE Right 01/31/2017   Procedure: OPEN REDUCTION  INTERNAL FIXATION (ORIF) RIGHT TRIMALLEOLAR ANKLE FRACTURE;  Surgeon: [name], MD;  Location: [facility] OR;  Service: Orthopedics;  Laterality: Right;    Family Psychiatric History:  No reported family history of psychiatric illness   Family History:  Family History  Problem Relation Age of Onset   Cancer Father    Diabetes Maternal Grandmother    Alzheimer's disease Paternal Grandmother    Heart failure Paternal Grandmother    Heart failure Paternal Grandfather    Heart failure Maternal Grandfather     Social History:  Social History   Socioeconomic History   Marital status: Single    Spouse name: Not on file   Number of children: Not on file   Years of education: Not on file   Highest education level: Not on file  Occupational History   Not on file  Tobacco Use   Smoking status: Every Day    Packs/day: 1.00    Years: 10.00    Additional pack years: 0.00    Total pack years: 10.00    Types: Cigarettes   Smokeless tobacco: Never   Tobacco comments:    off and on  Substance and Sexual Activity   Alcohol use: Yes   Drug use: Yes    Types: Marijuana, Cocaine    Comment: heroin   Sexual activity: Not on file    Comment: Unknown  Other Topics Concern   Not on file  Social History Narrative   Not on file   Social Determinants of Health   Financial Resource Strain: Not on file  Food Insecurity: Not on file  Transportation Needs: Not on file  Physical Activity: Not on file  Stress: Not on file  Social Connections: Not on file    Allergies:  Allergies  Allergen Reactions   Amoxicillin Anaphylaxis, Itching, Swelling and Rash    "throat started to close up" Has patient had a PCN reaction causing immediate rash, facial/tongue/throat swelling, SOB or lightheadedness with hypotension: Yes Has patient had a PCN reaction causing severe rash involving mucus membranes or skin necrosis: Yes Has patient had a PCN reaction that required hospitalization No Has patient had  a PCN reaction occurring within the last 10 years: No If all of the above answers are "NO", then may proceed with Cephalosporin use.    Penicillins Anaphylaxis, Itching, Rash and Other (See Comments)    Has patient had a PCN reaction causing immediate rash, facial/tongue/throat swelling, SOB or lightheadedness with hypotension: unsure Has patient had a PCN reaction causing severe rash involving mucus membranes or skin necrosis: Unsure Has patient had a PCN reaction that required hospitalization Unsure Has patient had a PCN reaction occurring within the last 10 years: No If all of the above answers are "NO", then may proceed with Cephalosporin use.    Metabolic Disorder Labs: Lab Results  Component Value Date   HGBA1C 6.2 (H) 12/10/2015   MPG 131 12/10/2015   Lab Results  Component Value Date   PROLACTIN 28.2 (H) 12/10/2015   Lab Results  Component Value Date   CHOL  119 12/10/2015   TRIG 91 12/10/2015   HDL 34 (L) 12/10/2015   CHOLHDL 3.5 12/10/2015   VLDL 18 12/10/2015   LDLCALC 67 12/10/2015   Lab Results  Component Value Date   TSH 1.744 12/10/2015    Therapeutic Level Labs: No results found for: "LITHIUM" No results found for: "VALPROATE" No results found for: "CBMZ"  Current Medications: Current Outpatient Medications  Medication Sig Dispense Refill   benztropine (COGENTIN) 0.5 MG tablet Take 1 tablet (0.5 mg total) by mouth at bedtime. 30 tablet 2   gabapentin (NEURONTIN) 300 MG capsule Take 1 capsule (300 mg total) by mouth 3 (three) times daily. 90 capsule 2   GINSENG PO Take 1 tablet by mouth daily.     OVER THE COUNTER MEDICATION Take 1 tablet by mouth daily. Ginkgo Biloba     OVER THE COUNTER MEDICATION Take 2 tablets by mouth daily. L arginine PO     oxcarbazepine (TRILEPTAL) 600 MG tablet Take 1 tablet (600 mg total) by mouth 2 (two) times daily. 60 tablet 2   QUEtiapine (SEROQUEL) 200 MG tablet Take 1 tablet (200 mg total) by mouth at bedtime. 30 tablet 2    No current facility-administered medications for this visit.     Musculoskeletal: Strength & Muscle Tone: within normal limits Gait & Station: normal Patient leans: N/A  Psychiatric Specialty Exam: Review of Systems  Psychiatric/Behavioral:  Negative for decreased concentration, dysphoric mood, hallucinations, self-injury, sleep disturbance and suicidal ideas. The patient is not nervous/anxious and is not hyperactive.     There were no vitals taken for this visit.There is no height or weight on file to calculate BMI.  General Appearance: Casual  Eye Contact:  Good  Speech:  Clear and Coherent and Normal Rate  Volume:  Normal  Mood:  Euthymic  Affect:  Appropriate and Congruent  Thought Process:  Coherent and Descriptions of Associations: Intact  Orientation:  Full (Time, Place, and Person)  Thought Content: WDL   Suicidal Thoughts:  No  Homicidal Thoughts:  No  Memory:  Immediate;   Good Recent;   Good Remote;   Good  Judgement:  Good  Insight:  Good  Psychomotor Activity:  Normal  Concentration:  Concentration: Good and Attention Span: Good  Recall:  Good  Fund of Knowledge: Good  Language: Good  Akathisia:  No  Handed:  Right  AIMS (if indicated): not done  Assets:  Communication Skills Desire for Improvement Housing Intimacy Physical Health Resilience Social Support Vocational/Educational  ADL's:  Intact  Cognition: WNL  Sleep:  Good   Screenings: AIMS    Flowsheet Row [facility]  AIMS Total Score 0      AUDIT    Flowsheet Row [facility]  Alcohol Use Disorder Identification Test Final Score (AUDIT) 0      GAD-7    Flowsheet Row Clinical Support from 03/22/2023 in [facility] Clinical Support from 01/18/2023 in [facility] Counselor from  12/18/2022 in [facility] Clinical Support from 11/21/2022 in [facility] Counselor from 07/31/2022 in [facility]  Total GAD-7 Score 0 0 0 0 2      PHQ2-9    Flowsheet Row Clinical Support from 03/22/2023 in [facility] Clinical Support from 01/18/2023 in [facility] Counselor from 12/18/2022  in [facility] Clinical Support from 11/21/2022 in [facility] Clinical Support from 10/04/2022 in [facility]  PHQ-2 Total Score 0 0 0 0 0  PHQ-9 Total Score -- -- 0 -- --      Flowsheet Row Clinical Support from 03/22/2023 in [facility] Clinical Support from 01/18/2023 in [facility] Clinical Support from 11/21/2022 in [facility]  C-SSRS RISK CATEGORY No Risk No Risk No Risk        Assessment and Plan:   George [name] is a 32 year old, African-American male with a past psychiatric history significant for bipolar 1 disorder, anxiety, and cannabis use disorder who presents to [facility] for follow-up and medication management.  Patient presents today encounter reporting no issues or concerns regarding his current medication regimen.  Patient denies experiencing any adverse side effects from his medications and denies the need for dosage adjustments at this time.  Patient denies depression but occasionally endorses some anxiety when forgetting to take his gabapentin in the morning/afternoon.  Patient states that whenever he takes his gabapentin, he is able to tolerate situations more readily.  Patient to continue taking medications as prescribed.  Patient medications to be e-prescribed to pharmacy of choice.  Provider discussed obtaining labs from the  patient and house to assess for lipid panel, hemoglobin A1c, CMP, and CBC with differential.  Patient vocalized understanding.  Provider to possibly obtain labs during patient's next encounter/appointment.  Collaboration of Care: Collaboration of Care: Medication Management AEB provider managing patient's psychiatric medications, Psychiatrist AEB patient being followed by mental health provider, and Referral or follow-up with counselor/therapist AEB patient being seen by a licensed clinical social worker at this facility  Patient/Guardian was advised Release of Information must be obtained prior to any record release in order to collaborate their care with an outside provider. Patient/Guardian was advised if they have not already done so to contact the registration department to sign all necessary forms in order for [place] to release information regarding their care.   Consent: Patient/Guardian gives verbal consent for treatment and assignment of benefits for services provided during this visit. Patient/Guardian expressed understanding and agreed to proceed.   1. Bipolar 1 disorder, mixed, full remission (HCC)  - oxcarbazepine (TRILEPTAL) 600 MG tablet; Take 1 tablet (600 mg total) by mouth 2 (two) times daily.  Dispense: 60 tablet; Refill: 2 - QUEtiapine (SEROQUEL) 200 MG tablet; Take 1 tablet (200 mg total) by mouth at bedtime.  Dispense: 30 tablet; Refill: 2 - benztropine (COGENTIN) 0.5 MG tablet; Take 1 tablet (0.5 mg total) by mouth at bedtime.  Dispense: 30 tablet; Refill: 2  2. Anxiety  - gabapentin (NEURONTIN) 300 MG capsule; Take 1 capsule (300 mg total) by mouth 3 (three) times daily.  Dispense: 90 capsule; Refill: 2  3. Cannabis use disorder, severe, dependence (HCC)  Patient to follow-up in 2 months Provider spent a total of 22 minutes with the patient/reviewing patient's chart  Meta Hatchet, PA 03/22/2023, 12:27 PM

## 2023-04-04 ENCOUNTER — Ambulatory Visit (INDEPENDENT_AMBULATORY_CARE_PROVIDER_SITE_OTHER): Payer: No Payment, Other | Admitting: Clinical

## 2023-04-04 DIAGNOSIS — F3178 Bipolar disorder, in full remission, most recent episode mixed: Secondary | ICD-10-CM

## 2023-04-04 NOTE — Progress Notes (Signed)
   THERAPIST PROGRESS NOTE  Session Time: 45 minutes  Participation Level: Active  Behavioral Response: CasualAlertEuthymic  Type of Therapy: Individual Therapy  Treatment Goals addressed: client will identify cognitive patterns and beliefs that interfere with therapy once per session  ProgressTowards Goals: Progressing  Interventions: CBT and Supportive  Summary:  George [name] is a 32 y.o. male who presents to the scheduled appointment oriented times five, appropriately dressed and friendly. Client denied hallucinations and delusions. Client reported he has been having up's and downs. Client reported the high and lows have been more "normal" as it relates to situational stressors. Client reported the relationship with his family and relationship have been going well. Client reported his medication is working good. Client reported the medication has helped him with being slower to react to triggers. Client reported it is easier to catch the knee jerk reactions and decipher what to do. Client reported feeling like he gained a sense of control. Client reported he has been thinking a lot about his sister. Client reported he is not going to reach out. Client reported he feels like there are more cons than positives to possibly rekindling the relationship. Client reported otherwise work is going well and he is looking for a second job to help pay the bills. Evidence of progress towards goal:  client reported 1 positive of better impulse control with verbal responses.    Suicidal/Homicidal: Nowithout intent/plan  Therapist Response:  Therapist began the appointment asking the client how she has been doing since last seen. Therapist used CBT to engage using active listening and positive emotional support. Therapist used CBT to ask the client about medication management and compliance. Therapist used CBT to ask the client to discuss stressors and his thoughts/ feelings. Therapist used CBT  ask the client to identify his progress with frequency of use with coping skills with continued practice in his daily activity.    Therapist assigned the client homework to practice self care.   Plan: Return again in 4 weeks.  Diagnosis: bipolar 1 disorder, mixed, full remission  Collaboration of Care: Patient refused AEB none requested by the client  Patient/Guardian was advised Release of Information must be obtained prior to any record release in order to collaborate their care with an outside provider. Patient/Guardian was advised if they have not already done so to contact the registration department to sign all necessary forms in order for [place] to release information regarding their care.   Consent: Patient/Guardian gives verbal consent for treatment and assignment of benefits for services provided during this visit. Patient/Guardian expressed understanding and agreed to proceed.   Neena Rhymes Orton Capell, LCSW 04/04/2023

## 2023-04-18 ENCOUNTER — Ambulatory Visit (INDEPENDENT_AMBULATORY_CARE_PROVIDER_SITE_OTHER): Payer: No Payment, Other | Admitting: Clinical

## 2023-04-18 DIAGNOSIS — F3178 Bipolar disorder, in full remission, most recent episode mixed: Secondary | ICD-10-CM
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
## 2023-04-27 ENCOUNTER — Telehealth (HOSPITAL_COMMUNITY): Payer: Self-pay | Admitting: Physician Assistant

## 2023-05-15 ENCOUNTER — Ambulatory Visit (INDEPENDENT_AMBULATORY_CARE_PROVIDER_SITE_OTHER): Payer: No Payment, Other | Admitting: Clinical

## 2023-05-15 DIAGNOSIS — F3178 Bipolar disorder, in full remission, most recent episode mixed: Secondary | ICD-10-CM | POA: Diagnosis not present

## 2023-05-15 NOTE — Progress Notes (Signed)
   THERAPIST PROGRESS NOTE  Session Time: 45 minutes  Participation Level: Active  Behavioral Response: CasualAlertIrritable  Type of Therapy: Individual Therapy  Treatment Goals addressed: client will identify cognitive patterns and beliefs that interfere with therapy once per session  ProgressTowards Goals: Progressing  Interventions: CBT and Supportive  Summary:  George [name] is a 32 y.o. male who presents for the scheduled appointment oriented x 5, appropriately dressed, and friendly.  Client denied hallucinations and delusions. Client reported he was able to get his dog certified through an online process and did not need a letter from the therapist and/or psychiatrist for his landlord.  Client reported otherwise over the past week he has been having a feeling of irritability related to some stressors that have occurred from working within his relationship.  Client reported there was a discrepancy about work hours which his boss falsely accused him of.  Client reported he did not like how his boss communicated the issue to him and it caused him to react with confrontation.  Client reported also having communication issues within his relationship with his fiance.  Client reported he often applied her protein handling responsibilities considering the additional stress that she goes to with her children's father.  Client reported he notes that the way that she reacts when she is angry does upset him because he feels very disrespectful. Client reported he wants things to be normal and/or better. Client reported he feels like being the bigger person does not get him anything and fights the thoughts of being reactionary in stressful situations. Evidence of progress towards goal:  client reported 1 negative thought pattern contributing to irritability and depression.   Suicidal/Homicidal: Nowithout intent/plan  Therapist Response:  Therapist began the appointment asking the client how he  has been doing since last seen. Therapist used CBT to engage using active listening and positive emotional support. Therapist used CBT to have the client identify his cycle of thinking compared to progress he has made with managing his mood and communication skills. Therapist used CBT ask the client to identify his progress with frequency of use with coping skills with continued practice in his daily activity.    Therapist assigned the client homework to practice self care.   Plan: Return again in 4 weeks.  Diagnosis: bipolar 1 disorder, mixed ,full remission  Collaboration of Care: Patient refused AEB none requested by the client.  Patient/Guardian was advised Release of Information must be obtained prior to any record release in order to collaborate their care with an outside provider. Patient/Guardian was advised if they have not already done so to contact the registration department to sign all necessary forms in order for [place] to release information regarding their care.   Consent: Patient/Guardian gives verbal consent for treatment and assignment of benefits for services provided during this visit. Patient/Guardian expressed understanding and agreed to proceed.   Neena Rhymes Wandalene Abrams, LCSW 05/15/2023

## 2023-05-24 ENCOUNTER — Encounter (HOSPITAL_COMMUNITY): Payer: No Payment, Other | Admitting: Physician Assistant

## 2023-05-24 ENCOUNTER — Telehealth (HOSPITAL_COMMUNITY): Payer: Self-pay | Admitting: Physician Assistant

## 2023-05-25 ENCOUNTER — Other Ambulatory Visit (HOSPITAL_COMMUNITY): Payer: Self-pay | Admitting: Physician Assistant

## 2023-05-25 DIAGNOSIS — F3178 Bipolar disorder, in full remission, most recent episode mixed: Secondary | ICD-10-CM

## 2023-05-25 DIAGNOSIS — F419 Anxiety disorder, unspecified: Secondary | ICD-10-CM

## 2023-05-25 MED ORDER — BENZTROPINE MESYLATE 0.5 MG PO TABS: 0.5000 mg | ORAL_TABLET | Freq: Every day | ORAL | 2 refills | Status: DC

## 2023-05-25 MED ORDER — OXCARBAZEPINE 600 MG PO TABS: 600.0000 mg | ORAL_TABLET | Freq: Two times a day (BID) | ORAL | 2 refills | Status: DC

## 2023-05-25 MED ORDER — GABAPENTIN 300 MG PO CAPS
300.0000 mg | ORAL_CAPSULE | Freq: Three times a day (TID) | ORAL | 2 refills | Status: DC
Start: 2023-05-25 — End: 2023-06-29

## 2023-05-25 MED ORDER — QUETIAPINE FUMARATE 200 MG PO TABS: 200.0000 mg | ORAL_TABLET | Freq: Every day | ORAL | 2 refills | Status: DC

## 2023-05-25 NOTE — Telephone Encounter (Signed)
Message acknowledged and reviewed.

## 2023-05-25 NOTE — Progress Notes (Signed)
Patient unable to attend appointment due to being late. Provider to provide patient with a bridge of their medications. Patient's medications to be e-prescribed to pharmacy of choice.

## 2023-06-12 ENCOUNTER — Ambulatory Visit (INDEPENDENT_AMBULATORY_CARE_PROVIDER_SITE_OTHER): Payer: No Payment, Other | Admitting: Clinical

## 2023-06-12 DIAGNOSIS — F3178 Bipolar disorder, in full remission, most recent episode mixed: Secondary | ICD-10-CM

## 2023-06-12 NOTE — Progress Notes (Signed)
   THERAPIST PROGRESS NOTE  Session Time: 45 minutes  Participation Level: Active  Behavioral Response: CasualAlertIrritable  Type of Therapy: Individual Therapy  Treatment Goals addressed: client will identify cognitive patterns and beliefs that interfere with therapy 1x per session  ProgressTowards Goals: Progressing  Interventions: CBT and Supportive  Summary: George [name] is a 32 y.o. male who presents for the scheduled appointment oriented times five, appropriately dressed and friendly. Client denied hallucinations and delusions. Client reported on today he has been doing well but having some frustration at home and at work. Client reported as he has been working on detaching from people and situations that should not matter he realizes other negative traits about people. Client reported an incident at work where there tends to be a double standard. Client reported his boss called him out for being mean. Client reported people do not understand that he is not able to let go of negative emotions as easy as someone else. Client reported also at home with his fiance' she does not consider his feelings or have an effective way of communicating when they disagree. Client reported it makes it hard for him to keep a appropriate composure to not escalate things. Client reported he does feel proud of himself because he is living a life that other did not think he could such as working and having a relationship. Client reported he was encouraged to apply for disability.  Evidence of progress towards goal:  client reported 1 positive coping skill he uses 7 days per week which is emotion regulation before responding to triggering situations.  Suicidal/Homicidal: Nowithout intent/plan  Therapist Response:  Therapist began the appointment asking the client how he has been doing. Therapist used CBT to engage using active listening and positive emotional support. Therapist used CBT to give the  client time to discuss his thoughts and triggering situations at home and at work. Therapist used CBT to positively reinforce steps taken to ensure he responds appropriately. Therapist used CBT ask the client to identify his progress with frequency of use with coping skills with continued practice in his daily activity.    Therapist assigned the client homework to practice self care.   Plan: Return again in 3 weeks.  Diagnosis: bipolar 1 disorder, mixed, full remission  Collaboration of Care: Patient refused AEB none requested by the client.  Patient/Guardian was advised Release of Information must be obtained prior to any record release in order to collaborate their care with an outside provider. Patient/Guardian was advised if they have not already done so to contact the registration department to sign all necessary forms in order for [place] to release information regarding their care.   Consent: Patient/Guardian gives verbal consent for treatment and assignment of benefits for services provided during this visit. Patient/Guardian expressed understanding and agreed to proceed.   Neena Rhymes Nicholas Trompeter, LCSW 06/12/2023

## 2023-06-29 ENCOUNTER — Telehealth (HOSPITAL_COMMUNITY): Payer: No Payment, Other | Admitting: Physician Assistant

## 2023-06-29 ENCOUNTER — Encounter (HOSPITAL_COMMUNITY): Payer: Self-pay | Admitting: Physician Assistant

## 2023-06-29 DIAGNOSIS — F419 Anxiety disorder, unspecified: Secondary | ICD-10-CM | POA: Diagnosis not present

## 2023-06-29 DIAGNOSIS — F1220 Cannabis dependence, uncomplicated: Secondary | ICD-10-CM

## 2023-06-29 DIAGNOSIS — F3178 Bipolar disorder, in full remission, most recent episode mixed: Secondary | ICD-10-CM

## 2023-06-29 MED ORDER — BENZTROPINE MESYLATE 0.5 MG PO TABS
0.5000 mg | ORAL_TABLET | Freq: Every day | ORAL | 2 refills | Status: DC
Start: 2023-06-29 — End: 2023-08-31

## 2023-06-29 MED ORDER — GABAPENTIN 300 MG PO CAPS
300.0000 mg | ORAL_CAPSULE | Freq: Three times a day (TID) | ORAL | 2 refills | Status: DC
Start: 2023-06-29 — End: 2023-08-31

## 2023-06-29 MED ORDER — OXCARBAZEPINE 600 MG PO TABS
600.0000 mg | ORAL_TABLET | Freq: Two times a day (BID) | ORAL | 2 refills | Status: DC
Start: 2023-06-29 — End: 2023-08-31

## 2023-06-29 MED ORDER — QUETIAPINE FUMARATE 200 MG PO TABS
200.0000 mg | ORAL_TABLET | Freq: Every day | ORAL | 2 refills | Status: DC
Start: 2023-06-29 — End: 2023-08-31

## 2023-06-29 NOTE — Progress Notes (Unsigned)
BH MD/PA/NP OP Progress Note  Virtual Visit via Video Note  I connected with George [name] on 06/29/23 at 10:30 AM EDT by a video enabled telemedicine application and verified that I am speaking with the correct person using two identifiers.  Location: Patient: Home Provider: Clinic   I discussed the limitations of evaluation and management by telemedicine and the availability of in person appointments. The patient expressed understanding and agreed to proceed.  Follow Up Instructions:  I discussed the assessment and treatment plan with the patient. The patient was provided an opportunity to ask questions and all were answered. The patient agreed with the plan and demonstrated an understanding of the instructions.   The patient was advised to call back or seek an in-person evaluation if the symptoms worsen or if the condition fails to improve as anticipated.  I provided 13 minutes of non-face-to-face time during this encounter.  Meta Hatchet, PA    06/29/2023 3:25 PM George [name]  MRN:  [id number]  Chief Complaint:  Chief Complaint  Patient presents with   Follow-up   Medication Refill   HPI:   George [name] is a 32 year old, African-American male with a past psychiatric history significant for bipolar 1 disorder, anxiety, and cannabis use disorder who presents to [facility] via virtual video visit for follow-up and medication management.  Patient is currently being managed on the following psychiatric medications:  Gabapentin 300 mg 3 times daily Cogentin 0.5 mg at bedtime Seroquel 200 mg at bedtime Oxcarbazepine 600 mg 2 times daily  Patient presents to the encounter reporting no issues or concerns regarding his current medication regimen.  Patient denies experiencing any adverse side effects and denies the need for dosage adjustments at this time.  Patient denies depression or anxiety and states that his life has been  all right.  He reports that his anxiety medications continue to be helpful in managing his anxiety.  He endorses some stress related to work and states that he may be applying for an ADA plan through his job (accommodations for work).  Provider discussed with patient about receiving blood work due to his current use of antipsychotic.  Patient is alert and oriented x 4, calm, cooperative, and fully engaged in conversation during the encounter.  Patient endorses pretty good mood.  Patient denies suicidal or homicidal ideations.  He further denies auditory or visual hallucinations and does not appear to be responding to internal/external stimuli.  Patient endorses good sleep and received on average 6 to 7 hours of sleep per night.  Patient endorses good appetite and eats on average 2 meals along with snacking throughout the day.  Patient endorses alcohol consumption sparingly.  Patient denies tobacco use but does engage in vaping.  Patient denies illicit drug use but states that he does engage in the use of delta 8.  Visit Diagnosis:    ICD-10-CM   1. Cannabis use disorder, severe, dependence (HCC)  F12.20     2. Bipolar 1 disorder, mixed, full remission (HCC)  F31.78 oxcarbazepine (TRILEPTAL) 600 MG tablet    QUEtiapine (SEROQUEL) 200 MG tablet    benztropine (COGENTIN) 0.5 MG tablet    3. Anxiety  F41.9 gabapentin (NEURONTIN) 300 MG capsule       Past Psychiatric History:  Bipolar 1 disorder   History of several emergency room visits as well as psychiatry hospitalizations in the past.  Patient was recently discharged from [facility] on 09/23/2022  Past Medical  History:  Past Medical History:  Diagnosis Date   ADHD (attention deficit hyperactivity disorder)    Ankle fracture 01/2017   RIGHT ANKLE   Anxiety    Bipolar disorder (HCC)    Depression    Sleep disorder 04/30/2014    Past Surgical History:  Procedure Laterality Date   NO PAST SURGERIES     ORIF ANKLE FRACTURE Right  01/31/2017   Procedure: OPEN REDUCTION INTERNAL FIXATION (ORIF) RIGHT TRIMALLEOLAR ANKLE FRACTURE;  Surgeon: [name], MD;  Location: [facility] OR;  Service: Orthopedics;  Laterality: Right;    Family Psychiatric History:  No reported family history of psychiatric illness   Family History:  Family History  Problem Relation Age of Onset   Cancer Father    Diabetes Maternal Grandmother    Alzheimer's disease Paternal Grandmother    Heart failure Paternal Grandmother    Heart failure Paternal Grandfather    Heart failure Maternal Grandfather     Social History:  Social History   Socioeconomic History   Marital status: Single    Spouse name: Not on file   Number of children: Not on file   Years of education: Not on file   Highest education level: Not on file  Occupational History   Not on file  Tobacco Use   Smoking status: Every Day    Current packs/day: 1.00    Average packs/day: 1 pack/day for 10.0 years (10.0 ttl pk-yrs)    Types: Cigarettes   Smokeless tobacco: Never   Tobacco comments:    off and on  Substance and Sexual Activity   Alcohol use: Yes   Drug use: Yes    Types: Marijuana, Cocaine    Comment: heroin   Sexual activity: Not on file    Comment: Unknown  Other Topics Concern   Not on file  Social History Narrative   Not on file   Social Determinants of Health   Financial Resource Strain: Not on file  Food Insecurity: Not on file  Transportation Needs: Not on file  Physical Activity: Not on file  Stress: Not on file  Social Connections: Not on file    Allergies:  Allergies  Allergen Reactions   Amoxicillin Anaphylaxis, Itching, Swelling and Rash    "throat started to close up" Has patient had a PCN reaction causing immediate rash, facial/tongue/throat swelling, SOB or lightheadedness with hypotension: Yes Has patient had a PCN reaction causing severe rash involving mucus membranes or skin necrosis: Yes Has patient had a PCN reaction that required  hospitalization No Has patient had a PCN reaction occurring within the last 10 years: No If all of the above answers are "NO", then may proceed with Cephalosporin use.    Penicillins Anaphylaxis, Itching, Rash and Other (See Comments)    Has patient had a PCN reaction causing immediate rash, facial/tongue/throat swelling, SOB or lightheadedness with hypotension: unsure Has patient had a PCN reaction causing severe rash involving mucus membranes or skin necrosis: Unsure Has patient had a PCN reaction that required hospitalization Unsure Has patient had a PCN reaction occurring within the last 10 years: No If all of the above answers are "NO", then may proceed with Cephalosporin use.    Metabolic Disorder Labs: Lab Results  Component Value Date   HGBA1C 6.2 (H) 12/10/2015   MPG 131 12/10/2015   Lab Results  Component Value Date   PROLACTIN 28.2 (H) 12/10/2015   Lab Results  Component Value Date   CHOL 119 12/10/2015   TRIG  91 12/10/2015   HDL 34 (L) 12/10/2015   CHOLHDL 3.5 12/10/2015   VLDL 18 12/10/2015   LDLCALC 67 12/10/2015   Lab Results  Component Value Date   TSH 1.744 12/10/2015    Therapeutic Level Labs: No results found for: "LITHIUM" No results found for: "VALPROATE" No results found for: "CBMZ"  Current Medications: Current Outpatient Medications  Medication Sig Dispense Refill   benztropine (COGENTIN) 0.5 MG tablet Take 1 tablet (0.5 mg total) by mouth at bedtime. 30 tablet 2   gabapentin (NEURONTIN) 300 MG capsule Take 1 capsule (300 mg total) by mouth 3 (three) times daily. 90 capsule 2   GINSENG PO Take 1 tablet by mouth daily.     OVER THE COUNTER MEDICATION Take 1 tablet by mouth daily. Ginkgo Biloba     OVER THE COUNTER MEDICATION Take 2 tablets by mouth daily. L arginine PO     oxcarbazepine (TRILEPTAL) 600 MG tablet Take 1 tablet (600 mg total) by mouth 2 (two) times daily. 60 tablet 2   QUEtiapine (SEROQUEL) 200 MG tablet Take 1 tablet (200 mg  total) by mouth at bedtime. 30 tablet 2   No current facility-administered medications for this visit.     Musculoskeletal: Strength & Muscle Tone: within normal limits Gait & Station: normal Patient leans: N/A  Psychiatric Specialty Exam: Review of Systems  Psychiatric/Behavioral:  Negative for decreased concentration, dysphoric mood, hallucinations, self-injury, sleep disturbance and suicidal ideas. The patient is not nervous/anxious and is not hyperactive.     There were no vitals taken for this visit.There is no height or weight on file to calculate BMI.  General Appearance: Casual  Eye Contact:  Good  Speech:  Clear and Coherent and Normal Rate  Volume:  Normal  Mood:  Euthymic  Affect:  Appropriate  Thought Process:  Coherent and Descriptions of Associations: Intact  Orientation:  Full (Time, Place, and Person)  Thought Content: WDL   Suicidal Thoughts:  No  Homicidal Thoughts:  No  Memory:  Immediate;   Good Recent;   Good Remote;   Good  Judgement:  Good  Insight:  Good  Psychomotor Activity:  Normal  Concentration:  Concentration: Good and Attention Span: Good  Recall:  Good  Fund of Knowledge: Good  Language: Good  Akathisia:  No  Handed:  Right  AIMS (if indicated): not done  Assets:  Communication Skills Desire for Improvement Housing Intimacy Physical Health Resilience Social Support Vocational/Educational  ADL's:  Intact  Cognition: WNL  Sleep:  Good   Screenings: AIMS    Flowsheet Row [facility]  AIMS Total Score 0      AUDIT    Flowsheet Row [facility]  Alcohol Use Disorder Identification Test Final Score (AUDIT) 0      GAD-7    Flowsheet Row Video Visit from 06/29/2023 in [facility] Clinical Support from 03/22/2023 in [facility] Clinical Support from 01/18/2023 in [facility] Counselor from [facility]/18/20[facility] from 11/21/2022 in [facility]  Total GAD-7 Score 0 0 0 0 0      PHQ2-9    Flowsheet Row Video Visit from 06/29/2023 in [facility] Clinical Support from 03/22/2023 in [facility] Clinical Support from 01/18/2023 in [facility] Counselor from 12/18/2022 in [facility] Clinical Support from 11/21/2022 in [facility]  PHQ-2 Total Score 0 0 0 0 0  PHQ-9 Total Score -- -- -- 0 --      Flowsheet Row Video Visit from 06/29/2023 in [facility] Clinical Support from 03/22/2023 in [facility] Clinical Support from 01/18/2023 in [facility]  C-SSRS RISK CATEGORY No Risk No Risk No Risk        Assessment and Plan:   George [name] is a 32 year old, African-American male with a past psychiatric history significant for bipolar 1 disorder, anxiety, and cannabis use disorder who presents to [facility] via virtual video visit for follow-up and medication management.  Patient presents to the encounter reporting no issues or concerns regarding his current medication regimen.  Patient denies depressive symptoms nor does he endorse anxiety.  Although patient endorses some stress related to work, he reports that his anxiety medication continues to be effective in managing his anxiety.  Patient denies the need for dosage adjustments at this time and would like to continue taking his medications as prescribed.  Patient's medications to be e-prescribed through pharmacy of choice.  Provider discussed with patient about obtaining the following labs: Lipid panel,  CBC with differential, hemoglobin A1c, and CMP.  Patient verbalized understanding.  Collaboration of Care: Collaboration of Care: Medication Management AEB provider managing patient's psychiatric medications, Psychiatrist AEB patient being followed by mental health provider, and Referral or follow-up with counselor/therapist AEB patient being seen by a licensed clinical social worker at this facility  Patient/Guardian was advised Release of Information must be obtained prior to any record release in order to collaborate their care with an outside provider. Patient/Guardian was advised if they have not already done so to contact the registration department to sign all necessary forms in order for [place] to release information regarding their care.   Consent: Patient/Guardian gives verbal consent for treatment and assignment of benefits for services provided during this visit. Patient/Guardian expressed understanding and agreed to proceed.   1. Bipolar 1 disorder, mixed, full remission (HCC)  - oxcarbazepine (TRILEPTAL) 600 MG tablet; Take 1 tablet (600 mg total) by mouth 2 (two) times daily.  Dispense: 60 tablet; Refill: 2 - QUEtiapine (SEROQUEL) 200 MG tablet; Take 1 tablet (200 mg total) by mouth at bedtime.  Dispense: 30 tablet; Refill: 2 - benztropine (COGENTIN) 0.5 MG tablet; Take 1 tablet (0.5 mg total) by mouth at bedtime.  Dispense: 30 tablet; Refill: 2  2. Anxiety  - gabapentin (NEURONTIN) 300 MG capsule; Take 1 capsule (300 mg total) by mouth 3 (three) times daily.  Dispense: 90 capsule; Refill: 2  3. Cannabis use disorder, severe, dependence (HCC)  Patient to follow-up in 2 months Provider spent a total of 13 minutes with the patient/reviewing patient's chart  Meta Hatchet, PA 06/29/2023, 3:25 PM

## 2023-07-03 ENCOUNTER — Ambulatory Visit (HOSPITAL_COMMUNITY): Payer: No Payment, Other | Admitting: Clinical

## 2023-07-23 ENCOUNTER — Ambulatory Visit (INDEPENDENT_AMBULATORY_CARE_PROVIDER_SITE_OTHER): Payer: No Payment, Other | Admitting: Clinical

## 2023-07-23 DIAGNOSIS — F3178 Bipolar disorder, in full remission, most recent episode mixed: Secondary | ICD-10-CM

## 2023-07-23 NOTE — Progress Notes (Signed)
   THERAPIST PROGRESS NOTE  Session Time: 60 minutes  Participation Level: Active  Behavioral Response: CasualAlertEuthymic  Type of Therapy: Individual Therapy  Treatment Goals addressed: Client will follow cognitive patterns and beliefs that interfere with therapy once per session  ProgressTowards Goals: Progressing  Interventions: CBT and Supportive  Summary:  George [name] is a 32 y.o. male who presents for the scheduled appointment for the scheduled appointment oriented times five, appropriately dressed and friendly. Client denied hallucinations and delusions. Client reported today he is doing fairly well but has experienced some challenges.  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  Client reported that communication of high reps had gotten him to a point of frustration insinuating that they could remove him from his position amongst other things.  Client reported once they saw that he was genuinely frustrated they change their tone.  Client reported he has noticed that people easily tell him things about himself but once they are situations where that is reflected in other people they deny it.  Client reported that seem to reflect society message of men not expressing or communicating their thoughts and feelings.  Client reported he feels like sometimes it would be better to not communicate with people about what bothers him.  Client reported he might ultimately that would not be beneficial for him. Evidence of progress towards goal: Client reported 1 positive about himself which is identifying identifying unhealthy cognitive patterns that reinforce irritability and negative behaviors.  Suicidal/Homicidal: Nowithout intent/plan  Therapist Response:  Therapist began the appointment asking client how he has been doing since last seen. Therapist used CBT to engage using active listening and positive emotional support. Therapist used CBT to give the  client time to discuss challenges that have occurred and how it affected his thought process and emotions. Therapist used CBT to engage with the client in helping to identify negative thought patterns and origin. Therapist used CBT to engage and continue teaching the client about healthy communication and identifying his own cognitive distortions. Therapist used CBT ask the client to identify her progress with frequency of use with coping skills with continued practice in her daily activity.       Plan: Return again in 4 weeks.  Diagnosis: Bipolar 1 disorder, mixed, in full remission  Collaboration of Care: Patient refused AEB none requested by the client.  Patient/Guardian was advised Release of Information must be obtained prior to any record release in order to collaborate their care with an outside provider. Patient/Guardian was advised if they have not already done so to contact the registration department to sign all necessary forms in order for [place] to release information regarding their care.   Consent: Patient/Guardian gives verbal consent for treatment and assignment of benefits for services provided during this visit. Patient/Guardian expressed understanding and agreed to proceed.   George Rhymes Sadie Hazelett, LCSW 07/23/2023

## 2023-08-23 ENCOUNTER — Ambulatory Visit (HOSPITAL_COMMUNITY): Payer: No Payment, Other | Admitting: Clinical

## 2023-08-23 DIAGNOSIS — F411 Generalized anxiety disorder: Secondary | ICD-10-CM

## 2023-08-23 NOTE — Progress Notes (Signed)
   THERAPIST PROGRESS NOTE  Session Time: 45 minutes  Participation Level: Active  Behavioral Response: CasualAlertEuthymic  Type of Therapy: Individual Therapy  Treatment Goals addressed: [name] WILL IDENTIFY COGNITIVE PATTERNS AND BELIEFS THAT INTERFERE WITH THERAPY ONCE PER SESSION   ProgressTowards Goals: Progressing  Interventions: CBT and Supportive  Summary:  George [name] is a 32 y.o. male who presents for the scheduled appointment oriented times five, appropriately dressed and friendly. Client denied hallucinations and delusions. Client reported he has been doing okay but experiencing difficulty with a mood. Client reported he has been having difficultly with managing irritability and his verbal response to being triggered. Client reported every so often he is prone to this happening even with taking his medication. Client reported he has gotten better with walking away and breathing. Client reported he needs to continue practicing skills he knows from previous therapy to current. Client reported his mom suggested that go to therapy together. Client reported that would be good because he knows they both have been through things together that have affected their dynamic.  Evidence of progress towards goal:  client reported 1 positive of practicing emotion regulation by walking off and being mindful of what he said.  Suicidal/Homicidal: Nowithout intent/plan  Therapist Response:  Therapist began the asking client how he has been doing since he was last seen. Therapist used CBT to engage using active listening and positive emotional support. Therapist used CBT to engage and give the client time to discuss his thoughts and feelings pertaining his current symptoms of anxiety and irritability he has been experiencing. Therapist used CBT to engage and discuss emotion regulation techniques. Therapist used CBT ask the client to identify his progress with frequency of use with coping  skills with continued practice in his daily activity.    Therapist assigned the client homework to practice the skills discussed.   Plan: Return again in 3 weeks.  Diagnosis: generalized anxiety disorder  Collaboration of Care: Patient refused AEB none requested by the client.  Patient/Guardian was advised Release of Information must be obtained prior to any record release in order to collaborate their care with an outside provider. Patient/Guardian was advised if they have not already done so to contact the registration department to sign all necessary forms in order for [place] to release information regarding their care.   Consent: Patient/Guardian gives verbal consent for treatment and assignment of benefits for services provided during this visit. Patient/Guardian expressed understanding and agreed to proceed.   Neena Rhymes Evadene Wardrip, LCSW 08/23/2023

## 2023-08-31 ENCOUNTER — Telehealth (INDEPENDENT_AMBULATORY_CARE_PROVIDER_SITE_OTHER): Payer: No Payment, Other | Admitting: Physician Assistant

## 2023-08-31 ENCOUNTER — Encounter (HOSPITAL_COMMUNITY): Payer: Self-pay | Admitting: Physician Assistant

## 2023-08-31 DIAGNOSIS — F3178 Bipolar disorder, in full remission, most recent episode mixed: Secondary | ICD-10-CM | POA: Diagnosis not present

## 2023-08-31 DIAGNOSIS — F419 Anxiety disorder, unspecified: Secondary | ICD-10-CM

## 2023-08-31 MED ORDER — HYDROXYZINE HCL 25 MG PO TABS
25.0000 mg | ORAL_TABLET | Freq: Two times a day (BID) | ORAL | 1 refills | Status: DC | PRN
Start: 2023-08-31 — End: 2023-10-23

## 2023-08-31 MED ORDER — OXCARBAZEPINE 600 MG PO TABS
600.0000 mg | ORAL_TABLET | Freq: Two times a day (BID) | ORAL | 2 refills | Status: DC
Start: 2023-08-31 — End: 2023-10-23

## 2023-08-31 MED ORDER — QUETIAPINE FUMARATE 200 MG PO TABS
200.0000 mg | ORAL_TABLET | Freq: Every day | ORAL | 2 refills | Status: DC
Start: 2023-08-31 — End: 2023-10-23

## 2023-08-31 MED ORDER — BENZTROPINE MESYLATE 0.5 MG PO TABS
0.5000 mg | ORAL_TABLET | Freq: Every day | ORAL | 2 refills | Status: DC
Start: 2023-08-31 — End: 2023-10-23

## 2023-08-31 MED ORDER — GABAPENTIN 300 MG PO CAPS
300.0000 mg | ORAL_CAPSULE | Freq: Three times a day (TID) | ORAL | 2 refills | Status: DC
Start: 2023-08-31 — End: 2023-10-23

## 2023-08-31 NOTE — Progress Notes (Unsigned)
BH MD/PA/NP OP Progress Note  Virtual Visit via Video Note  I connected with George [name] on 08/31/23 at 11:30 AM EDT by a video enabled telemedicine application and verified that I am speaking with the correct person using two identifiers.  Location: Patient: Home Provider: Clinic   I discussed the limitations of evaluation and management by telemedicine and the availability of in person appointments. The patient expressed understanding and agreed to proceed.  Follow Up Instructions:  I discussed the assessment and treatment plan with the patient. The patient was provided an opportunity to ask questions and all were answered. The patient agreed with the plan and demonstrated an understanding of the instructions.   The patient was advised to call back or seek an in-person evaluation if the symptoms worsen or if the condition fails to improve as anticipated.  I provided 14 minutes of non-face-to-face time during this encounter.  Meta Hatchet, PA    08/31/2023 7:00 PM George [name]  MRN:  [id number]  Chief Complaint:  Chief Complaint  Patient presents with   Follow-up   Medication Refill   HPI:   George [name] is a 32 year old, African-American male with a past psychiatric history significant for bipolar 1 disorder, anxiety, and cannabis use disorder who presents to [facility] via virtual video visit for follow-up and medication management.  Patient is currently being managed on the following psychiatric medications:  Gabapentin 300 mg 3 times daily Cogentin 0.5 mg at bedtime Seroquel 200 mg at bedtime Oxcarbazepine 600 mg 2 times daily Hydroxyzine 25 mg 2 times daily as needed  Patient reports that he is doing pretty good.  He states that he has his ups and downs but he is able to bounce back from his episodes.  He reports that his therapy with his licensed clinical social worker has been helpful when going through these  ups and downs.  He reports no concerns at this time and states that he is taking his medications regularly.  He reports that he may occasionally miss an afternoon dosage of his gabapentin but states that his usage of hydroxyzine has been extremely helpful.  He reports that he will occasionally take a hydroxyzine before his work shift which allows him to handle things better. Patient denies depression nor does he endorse anxiety.  He denies any new stressors at this time and would like to continue taking his medications as prescribed.  Patient is alert and oriented x 4, calm, cooperative, and fully engaged in conversation during the encounter.  Patient endorses good mood.  Patient denies suicidal or homicidal ideations.  He further denies auditory or visual hallucinations and does not appear to be responding to internal/external stimuli.  Patient endorses good sleep and receives on average 6 to 7 hours of sleep per night.  He reports that he occasionally wakes up early to get his girls ready for school.  Patient endorses good appetite and eats on average 2 meals as well as snacking throughout the day.  Patient denies alcohol consumption.  He denies tobacco use but does engage in vaping.  Patient endorses illicit drug use in the form of marijuana.  Visit Diagnosis:    ICD-10-CM   1. Bipolar 1 disorder, mixed, full remission (HCC)  F31.78 oxcarbazepine (TRILEPTAL) 600 MG tablet    QUEtiapine (SEROQUEL) 200 MG tablet    benztropine (COGENTIN) 0.5 MG tablet    2. Anxiety  F41.9 gabapentin (NEURONTIN) 300 MG capsule  hydrOXYzine (ATARAX) 25 MG tablet      Past Psychiatric History:  Bipolar 1 disorder   History of several emergency room visits as well as psychiatry hospitalizations in the past.  Patient was recently discharged from [facility] on 09/23/2022  Past Medical History:  Past Medical History:  Diagnosis Date   ADHD (attention deficit hyperactivity disorder)    Ankle fracture 01/2017    RIGHT ANKLE   Anxiety    Bipolar disorder (HCC)    Depression    Sleep disorder 04/30/2014    Past Surgical History:  Procedure Laterality Date   NO PAST SURGERIES     ORIF ANKLE FRACTURE Right 01/31/2017   Procedure: OPEN REDUCTION INTERNAL FIXATION (ORIF) RIGHT TRIMALLEOLAR ANKLE FRACTURE;  Surgeon: [name], MD;  Location: [facility] OR;  Service: Orthopedics;  Laterality: Right;    Family Psychiatric History:  No reported family history of psychiatric illness   Family History:  Family History  Problem Relation Age of Onset   Cancer Father    Diabetes Maternal Grandmother    Alzheimer's disease Paternal Grandmother    Heart failure Paternal Grandmother    Heart failure Paternal Grandfather    Heart failure Maternal Grandfather     Social History:  Social History   Socioeconomic History   Marital status: Single    Spouse name: Not on file   Number of children: Not on file   Years of education: Not on file   Highest education level: Not on file  Occupational History   Not on file  Tobacco Use   Smoking status: Every Day    Current packs/day: 1.00    Average packs/day: 1 pack/day for 10.0 years (10.0 ttl pk-yrs)    Types: Cigarettes   Smokeless tobacco: Never   Tobacco comments:    off and on  Substance and Sexual Activity   Alcohol use: Yes   Drug use: Yes    Types: Marijuana, Cocaine    Comment: heroin   Sexual activity: Not on file    Comment: Unknown  Other Topics Concern   Not on file  Social History Narrative   Not on file   Social Determinants of Health   Financial Resource Strain: Not on file  Food Insecurity: Not on file  Transportation Needs: Not on file  Physical Activity: Not on file  Stress: Not on file  Social Connections: Not on file    Allergies:  Allergies  Allergen Reactions   Amoxicillin Anaphylaxis, Itching, Swelling and Rash    "throat started to close up" Has patient had a PCN reaction causing immediate rash,  facial/tongue/throat swelling, SOB or lightheadedness with hypotension: Yes Has patient had a PCN reaction causing severe rash involving mucus membranes or skin necrosis: Yes Has patient had a PCN reaction that required hospitalization No Has patient had a PCN reaction occurring within the last 10 years: No If all of the above answers are "NO", then may proceed with Cephalosporin use.    Penicillins Anaphylaxis, Itching, Rash and Other (See Comments)    Has patient had a PCN reaction causing immediate rash, facial/tongue/throat swelling, SOB or lightheadedness with hypotension: unsure Has patient had a PCN reaction causing severe rash involving mucus membranes or skin necrosis: Unsure Has patient had a PCN reaction that required hospitalization Unsure Has patient had a PCN reaction occurring within the last 10 years: No If all of the above answers are "NO", then may proceed with Cephalosporin use.    Metabolic Disorder Labs: Lab  Results  Component Value Date   HGBA1C 6.2 (H) 12/10/2015   MPG 131 12/10/2015   Lab Results  Component Value Date   PROLACTIN 28.2 (H) 12/10/2015   Lab Results  Component Value Date   CHOL 119 12/10/2015   TRIG 91 12/10/2015   HDL 34 (L) 12/10/2015   CHOLHDL 3.5 12/10/2015   VLDL 18 12/10/2015   LDLCALC 67 12/10/2015   Lab Results  Component Value Date   TSH 1.744 12/10/2015    Therapeutic Level Labs: No results found for: "LITHIUM" No results found for: "VALPROATE" No results found for: "CBMZ"  Current Medications: Current Outpatient Medications  Medication Sig Dispense Refill   hydrOXYzine (ATARAX) 25 MG tablet Take 1 tablet (25 mg total) by mouth 2 (two) times daily as needed. 60 tablet 1   benztropine (COGENTIN) 0.5 MG tablet Take 1 tablet (0.5 mg total) by mouth at bedtime. 30 tablet 2   gabapentin (NEURONTIN) 300 MG capsule Take 1 capsule (300 mg total) by mouth 3 (three) times daily. 90 capsule 2   GINSENG PO Take 1 tablet by mouth  daily.     OVER THE COUNTER MEDICATION Take 1 tablet by mouth daily. Ginkgo Biloba     OVER THE COUNTER MEDICATION Take 2 tablets by mouth daily. L arginine PO     oxcarbazepine (TRILEPTAL) 600 MG tablet Take 1 tablet (600 mg total) by mouth 2 (two) times daily. 60 tablet 2   QUEtiapine (SEROQUEL) 200 MG tablet Take 1 tablet (200 mg total) by mouth at bedtime. 30 tablet 2   No current facility-administered medications for this visit.     Musculoskeletal: Strength & Muscle Tone: within normal limits Gait & Station: normal Patient leans: N/A  Psychiatric Specialty Exam: Review of Systems  Psychiatric/Behavioral:  Negative for decreased concentration, dysphoric mood, hallucinations, self-injury, sleep disturbance and suicidal ideas. The patient is not nervous/anxious and is not hyperactive.     There were no vitals taken for this visit.There is no height or weight on file to calculate BMI.  General Appearance: Casual  Eye Contact:  Good  Speech:  Clear and Coherent and Normal Rate  Volume:  Normal  Mood:  Euthymic  Affect:  Appropriate  Thought Process:  Coherent and Descriptions of Associations: Intact  Orientation:  Full (Time, Place, and Person)  Thought Content: WDL   Suicidal Thoughts:  No  Homicidal Thoughts:  No  Memory:  Immediate;   Good Recent;   Good Remote;   Good  Judgement:  Good  Insight:  Good  Psychomotor Activity:  Normal  Concentration:  Concentration: Good and Attention Span: Good  Recall:  Good  Fund of Knowledge: Good  Language: Good  Akathisia:  No  Handed:  Right  AIMS (if indicated): not done  Assets:  Communication Skills Desire for Improvement Housing Intimacy Physical Health Resilience Social Support Vocational/Educational  ADL's:  Intact  Cognition: WNL  Sleep:  Good   Screenings: AIMS    Flowsheet Row [facility]  AIMS Total Score 0      AUDIT     Flowsheet Row [facility]  Alcohol Use Disorder Identification Test Final Score (AUDIT) 0      GAD-7    Flowsheet Row Video Visit from 06/29/2023 in [facility] Clinical Support from 03/22/2023 in [facility] Clinical Support from 01/18/2023 in [facility] Counselor from 12/18/2022 in [facility] Clinical Support from 11/21/2022 in [facility]  Total GAD-7 Score 0 0 0 0 0      PHQ2-9    Flowsheet Row Video Visit from 08/31/2023 in [facility] Video Visit from 06/29/2023 in [facility] Clinical Support from 03/22/2023 in [facility] Clinical Support from 01/18/2023 in [facility] Counselor from 12/18/2022 in [facility]  PHQ-2 Total Score 0 0 0 0 0  PHQ-9 Total Score -- -- -- -- 0      Flowsheet Row Video Visit from 08/31/2023 in [facility] Video Visit from 06/29/2023 in [facility] Clinical Support from 03/22/2023 in [facility]  C-SSRS RISK CATEGORY Moderate Risk No Risk No Risk        Assessment and Plan:   George [name] is a 32 year old, African-American male with a past psychiatric history significant for bipolar 1 disorder, anxiety, and cannabis use disorder who presents to [facility] via virtual video visit for follow-up and medication management.  Patient presents today encounter reporting no issues or concerns regarding his current medication regimen.  He reports that he occasionally has his ups and downs but he tends to bounce back from his episodes fairly quickly.  He reports that his medications  have been extremely helpful in managing his mood.  He reports that his use of hydroxyzine has been extremely helpful especially when working during his work shift.  Patient denies depression nor does he endorse anxiety.  He further denies any new stressors at this time.  Patient would like to continue taking his medications as prescribed.  Patient's medications to be e-prescribed through pharmacy of choice.  Collaboration of Care: Collaboration of Care: Medication Management AEB provider managing patient's psychiatric medications, Psychiatrist AEB patient being followed by mental health provider, and Referral or follow-up with counselor/therapist AEB patient being seen by a licensed clinical social worker at this facility  Patient/Guardian was advised Release of Information must be obtained prior to any record release in order to collaborate their care with an outside provider. Patient/Guardian was advised if they have not already done so to contact the registration department to sign all necessary forms in order for [place] to release information regarding their care.   Consent: Patient/Guardian gives verbal consent for treatment and assignment of benefits for services provided during this visit. Patient/Guardian expressed understanding and agreed to proceed.   1. Bipolar 1 disorder, mixed, full remission (HCC)  - oxcarbazepine (TRILEPTAL) 600 MG tablet; Take 1 tablet (600 mg total) by mouth 2 (two) times daily.  Dispense: 60 tablet; Refill: 2 - QUEtiapine (SEROQUEL) 200 MG tablet; Take 1 tablet (200 mg total) by mouth at bedtime.  Dispense: 30 tablet; Refill: 2 - benztropine (COGENTIN) 0.5 MG tablet; Take 1 tablet (0.5 mg total) by mouth at bedtime.  Dispense: 30 tablet; Refill: 2  2. Anxiety  - gabapentin (NEURONTIN) 300 MG capsule; Take 1 capsule (300 mg total) by mouth 3 (three) times daily.  Dispense: 90 capsule; Refill: 2 - hydrOXYzine (ATARAX) 25 MG tablet; Take 1 tablet (25 mg total) by mouth 2  (two) times daily as needed.  Dispense: 60 tablet; Refill: 1  Patient to follow-up in 2 months Provider spent a total of 14 minutes with the patient/reviewing patient's chart  Meta Hatchet,  PA 08/31/2023, 7:00 PM

## 2023-09-06 ENCOUNTER — Ambulatory Visit (HOSPITAL_COMMUNITY): Payer: No Payment, Other | Admitting: Clinical

## 2023-09-24 ENCOUNTER — Ambulatory Visit (INDEPENDENT_AMBULATORY_CARE_PROVIDER_SITE_OTHER): Payer: No Payment, Other | Admitting: Clinical

## 2023-09-24 DIAGNOSIS — F3178 Bipolar disorder, in full remission, most recent episode mixed: Secondary | ICD-10-CM

## 2023-09-24 NOTE — Progress Notes (Signed)
   THERAPIST PROGRESS NOTE  Session Time: 45 minutes  Participation Level: Active  Behavioral Response: CasualAlertAnxious  Type of Therapy: Individual Therapy  Treatment Goals addressed: [name] WILL IDENTIFY COGNITIVE PATTERNS AND BELIEFS THAT INTERFERE WITH THERAPY ONCE PER SESSION   ProgressTowards Goals: Progressing  Interventions: CBT  Summary:  George [name] is a 32 y.o. male who presents for the scheduled appointment oriented times five and appropriately dressed. Client denied hallucinations and delusions. Client reported he has been doing fairly well. Client reported he has been working on how he is responding to ineffective communication from management. Client reported when it comes to him or others they talk disrespectfully. Client reported he has also been working on communication in his relationship still. Client reported he can signal that he needs to before talking about what bothers him. Client reported one event when he planned to withdraw extra money for a specific reason and accidentally overspent. Client reported feeling irritable with minor things for days after.client reported he is learning to keep himself grounded with taking care of is mental health as it will be a life long journey. Evidence of progress towards goal:  client reported 1 cognitive pattern that can interfere with therapy process is the thought of managing his mental health with medication for his mental health.  Suicidal/Homicidal: Nowithout intent/plan  Therapist Response:  Therapist began the appointment asking him how he has been doing. Therapist used cbt to engage using active listening and positive emotional support. Therapist used cbt to engage and give the client time to discuss his thoughts and feelings. Therapist used cbt to engage and normalize his emotions. Therapist used cbt to continuing teaching about reframing perspective to an overall wellness approach. Therapist used CBT ask  the client to identify his progress with frequency of use with coping skills with continued practice in his daily activity.      Plan: Return again in 4 weeks.  Diagnosis: bipolar 1 disorder, mixed full remission  Collaboration of Care: Patient refused AEB none requested by the client.  Patient/Guardian was advised Release of Information must be obtained prior to any record release in order to collaborate their care with an outside provider. Patient/Guardian was advised if they have not already done so to contact the registration department to sign all necessary forms in order for [place] to release information regarding their care.   Consent: Patient/Guardian gives verbal consent for treatment and assignment of benefits for services provided during this visit. Patient/Guardian expressed understanding and agreed to proceed.   Neena Rhymes Stepfanie Yott, LCSW 09/24/2023

## 2023-10-23 ENCOUNTER — Telehealth (HOSPITAL_COMMUNITY): Payer: No Payment, Other | Admitting: Physician Assistant

## 2023-10-23 ENCOUNTER — Encounter (HOSPITAL_COMMUNITY): Payer: Self-pay | Admitting: Physician Assistant

## 2023-10-23 DIAGNOSIS — F419 Anxiety disorder, unspecified: Secondary | ICD-10-CM | POA: Diagnosis not present

## 2023-10-23 DIAGNOSIS — F3178 Bipolar disorder, in full remission, most recent episode mixed: Secondary | ICD-10-CM

## 2023-10-23 MED ORDER — GABAPENTIN 300 MG PO CAPS
300.0000 mg | ORAL_CAPSULE | Freq: Three times a day (TID) | ORAL | 2 refills | Status: DC
Start: 2023-10-23 — End: 2023-12-18

## 2023-10-23 MED ORDER — BENZTROPINE MESYLATE 0.5 MG PO TABS: 0.5000 mg | ORAL_TABLET | Freq: Every day | ORAL | 2 refills | Status: DC

## 2023-10-23 MED ORDER — HYDROXYZINE HCL 25 MG PO TABS: 25.0000 mg | ORAL_TABLET | Freq: Two times a day (BID) | ORAL | 1 refills | Status: DC | PRN

## 2023-10-23 MED ORDER — QUETIAPINE FUMARATE 200 MG PO TABS: 200.0000 mg | ORAL_TABLET | Freq: Every day | ORAL | 2 refills | Status: DC

## 2023-10-23 MED ORDER — OXCARBAZEPINE 600 MG PO TABS
600.0000 mg | ORAL_TABLET | Freq: Two times a day (BID) | ORAL | 2 refills | Status: DC
Start: 2023-10-23 — End: 2023-12-18

## 2023-10-23 NOTE — Progress Notes (Unsigned)
BH MD/PA/NP OP Progress Note  Virtual Visit via Video Note  I connected with George [name] on 10/23/23 at 10:00 AM EST by a video enabled telemedicine application and verified that I am speaking with the correct person using two identifiers.  Location: Patient: Home Provider: Clinic   I discussed the limitations of evaluation and management by telemedicine and the availability of in person appointments. The patient expressed understanding and agreed to proceed.  Follow Up Instructions:  I discussed the assessment and treatment plan with the patient. The patient was provided an opportunity to ask questions and all were answered. The patient agreed with the plan and demonstrated an understanding of the instructions.   The patient was advised to call back or seek an in-person evaluation if the symptoms worsen or if the condition fails to improve as anticipated.  I provided 16 minutes of non-face-to-face time during this encounter.  Meta Hatchet, PA    10/23/2023 7:40 PM George [name]  MRN:  [id number]  Chief Complaint:  Chief Complaint  Patient presents with   Follow-up   Medication Refill   HPI:   George [name] is a 32 year old, African-American male with a past psychiatric history significant for bipolar 1 disorder, anxiety, and cannabis use disorder who presents to [facility] via virtual video visit for follow-up and medication management.  Patient is currently being managed on the following psychiatric medications:  Gabapentin 300 mg 3 times daily Cogentin 0.5 mg at bedtime Seroquel 200 mg at bedtime Oxcarbazepine 600 mg 2 times daily Hydroxyzine 25 mg 2 times daily as needed  Patient presents today encounter stating that life has been somewhat stressful but he is handling it well.  Patient endorses stressors related to work and denies any other stressors at this time.  Patient denies overt depressive symptoms stating  that his medication regimen has been working well.  He reports that he occasionally forgets to take his gabapentin as scheduled but denies experiencing any anxiety at this time.  Patient is alert and oriented x 4, calm, cooperative, and fully engaged in conversation during the encounter.  Patient endorses pretty good mood.  Patient denies suicidal or homicidal ideations.  He further denies auditory or visual hallucinations and does not appear to be responding to internal/external stimuli.  Patient endorses fair sleep and receives on average 6 hours of sleep per day.  Patient endorses good appetite and eats on average 3 meals per day.  Patient denies alcohol consumption or tobacco use.  Patient does engage in vaping.  Patient endorses illicit drug use in the form of marijuana.  Visit Diagnosis:    ICD-10-CM   1. Bipolar 1 disorder, mixed, full remission (HCC)  F31.78 oxcarbazepine (TRILEPTAL) 600 MG tablet    QUEtiapine (SEROQUEL) 200 MG tablet    benztropine (COGENTIN) 0.5 MG tablet    2. Anxiety  F41.9 gabapentin (NEURONTIN) 300 MG capsule    hydrOXYzine (ATARAX) 25 MG tablet      Past Psychiatric History:  Bipolar 1 disorder   History of several emergency room visits as well as psychiatry hospitalizations in the past.  Patient was recently discharged from [facility] on 09/23/2022  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    Past Surgical  History:  Procedure Laterality Date   NO PAST SURGERIES     ORIF ANKLE FRACTURE Right 01/31/2017   Procedure: OPEN REDUCTION INTERNAL FIXATION (ORIF) RIGHT TRIMALLEOLAR ANKLE FRACTURE;  Surgeon: [name], MD;  Location: [facility] OR;  Service: Orthopedics;  Laterality: Right;    Family Psychiatric History:  No reported family history of psychiatric illness    Family History:  Family History  Problem Relation Age of Onset   Cancer Father    Diabetes Maternal Grandmother    Alzheimer's disease Paternal Grandmother    Heart failure Paternal Grandmother    Heart failure Paternal Grandfather    Heart failure Maternal Grandfather     Social History:  Social History   Socioeconomic History   Marital status: Single    Spouse name: Not on file   Number of children: Not on file   Years of education: Not on file   Highest education level: Not on file  Occupational History   Not on file  Tobacco Use   Smoking status: Every Day    Current packs/day: 1.00    Average packs/day: 1 pack/day for 10.0 years (10.0 ttl pk-yrs)    Types: Cigarettes   Smokeless tobacco: Never   Tobacco comments:    off and on  Substance and Sexual Activity   Alcohol use: Yes   Drug use: Yes    Types: Marijuana, Cocaine    Comment: heroin   Sexual activity: Not on file    Comment: Unknown  Other Topics Concern   Not on file  Social History Narrative   Not on file   Social Drivers of Health   Financial Resource Strain: Not on file  Food Insecurity: Not on file  Transportation Needs: Not on file  Physical Activity: Not on file  Stress: Not on file  Social Connections: Not on file    Allergies:  Allergies  Allergen Reactions   Amoxicillin Anaphylaxis, Itching, Swelling and Rash    "throat started to close up" Has patient had a PCN reaction causing immediate rash, facial/tongue/throat swelling, SOB or lightheadedness with hypotension: Yes Has patient had a PCN reaction causing severe rash involving mucus membranes or skin necrosis: Yes Has patient had a PCN reaction that required hospitalization No Has patient had a PCN reaction occurring within the last 10 years: No If all of the above answers are "NO", then may proceed with Cephalosporin use.    Penicillins Anaphylaxis, Itching, Rash and Other (See Comments)    Has patient had a PCN reaction  causing immediate rash, facial/tongue/throat swelling, SOB or lightheadedness with hypotension: unsure Has patient had a PCN reaction causing severe rash involving mucus membranes or skin necrosis: Unsure Has patient had a PCN reaction that required hospitalization Unsure Has patient had a PCN reaction occurring within the last 10 years: No If all of the above answers are "NO", then may proceed with Cephalosporin use.    Metabolic Disorder Labs: Lab Results  Component Value Date   HGBA1C 6.2 (H) 12/10/2015   MPG 131 12/10/2015   Lab Results  Component Value Date   PROLACTIN 28.2 (H) 12/10/2015   Lab Results  Component Value Date   CHOL 119 12/10/2015   TRIG 91 12/10/2015   HDL 34 (L) 12/10/2015   CHOLHDL 3.5 12/10/2015   VLDL 18 12/10/2015   LDLCALC 67 12/10/2015   Lab Results  Component Value Date   TSH 1.744 12/10/2015    Therapeutic Level Labs: No results found for: "LITHIUM" No results  found for: "VALPROATE" No results found for: "CBMZ"  Current Medications: Current Outpatient Medications  Medication Sig Dispense Refill   benztropine (COGENTIN) 0.5 MG tablet Take 1 tablet (0.5 mg total) by mouth at bedtime. 30 tablet 2   gabapentin (NEURONTIN) 300 MG capsule Take 1 capsule (300 mg total) by mouth 3 (three) times daily. 90 capsule 2   GINSENG PO Take 1 tablet by mouth daily.     hydrOXYzine (ATARAX) 25 MG tablet Take 1 tablet (25 mg total) by mouth 2 (two) times daily as needed. 60 tablet 1   OVER THE COUNTER MEDICATION Take 1 tablet by mouth daily. Ginkgo Biloba     OVER THE COUNTER MEDICATION Take 2 tablets by mouth daily. L arginine PO     oxcarbazepine (TRILEPTAL) 600 MG tablet Take 1 tablet (600 mg total) by mouth 2 (two) times daily. 60 tablet 2   QUEtiapine (SEROQUEL) 200 MG tablet Take 1 tablet (200 mg total) by mouth at bedtime. 30 tablet 2   No current facility-administered medications for this visit.     Musculoskeletal: Strength & Muscle Tone:  within normal limits Gait & Station: normal Patient leans: N/A  Psychiatric Specialty Exam: Review of Systems  Psychiatric/Behavioral:  Negative for decreased concentration, dysphoric mood, hallucinations, self-injury, sleep disturbance and suicidal ideas. The patient is not nervous/anxious and is not hyperactive.     There were no vitals taken for this visit.There is no height or weight on file to calculate BMI.  General Appearance: Casual  Eye Contact:  Good  Speech:  Clear and Coherent and Normal Rate  Volume:  Normal  Mood:  Euthymic  Affect:  Appropriate  Thought Process:  Coherent and Descriptions of Associations: Intact  Orientation:  Full (Time, Place, and Person)  Thought Content: WDL   Suicidal Thoughts:  No  Homicidal Thoughts:  No  Memory:  Immediate;   Good Recent;   Good Remote;   Good  Judgement:  Good  Insight:  Good  Psychomotor Activity:  Normal  Concentration:  Concentration: Good and Attention Span: Good  Recall:  Good  Fund of Knowledge: Good  Language: Good  Akathisia:  No  Handed:  Right  AIMS (if indicated): not done  Assets:  Communication Skills Desire for Improvement Housing Intimacy Physical Health Resilience Social Support Vocational/Educational  ADL's:  Intact  Cognition: WNL  Sleep:  Good   Screenings: AIMS    Flowsheet Row [facility]  AIMS Total Score 0      AUDIT    Flowsheet Row [facility]  Alcohol Use Disorder Identification Test Final Score (AUDIT) 0      GAD-7    Flowsheet Row Video Visit from 10/23/2023 in [facility] Video Visit from 06/29/2023 in [facility] Clinical Support from 03/22/2023 in [facility] Clinical Support from 01/18/2023 in [facility]  Counselor from 12/18/2022 in [facility]  Total GAD-7 Score 0 0 0 0 0      PHQ2-9    Flowsheet Row Video Visit from 10/23/2023 in [facility] Video Visit from 08/31/2023 in [facility] Video Visit from 06/29/2023 in [facility] Clinical Support from 03/22/2023 in [facility] Clinical Support from 01/18/2023 in [place] [facility]  PHQ-2 Total Score  0 0 0 0 0      Flowsheet Row Video Visit from 10/23/2023 in [facility] Video Visit from 08/31/2023 in [facility] Video Visit from 06/29/2023 in [facility]  C-SSRS RISK CATEGORY Moderate Risk Moderate Risk No Risk        Assessment and Plan:   George [name] is a 32 year old, African-American male with a past psychiatric history significant for bipolar 1 disorder, anxiety, and cannabis use disorder who presents to [facility] via virtual video visit for follow-up and medication management.  Patient presents today encounter reporting no issues or concerns regarding his current medication regimen.  Patient denies overt depressive symptoms nor does he endorse anxiety.  The only stressors that the patient endorses are stressors related to work.  Patient endorses stability on his current medication regimen and will continue to take his medications as prescribed.  Patient's medications to be e-prescribed to pharmacy of choice.  Collaboration of Care: Collaboration of Care: Medication Management AEB provider managing patient's psychiatric medications, Psychiatrist AEB patient being followed by mental health provider, and Referral or follow-up with counselor/therapist AEB patient being seen by a licensed clinical social worker at this facility  Patient/Guardian was  advised Release of Information must be obtained prior to any record release in order to collaborate their care with an outside provider. Patient/Guardian was advised if they have not already done so to contact the registration department to sign all necessary forms in order for [place] to release information regarding their care.   Consent: Patient/Guardian gives verbal consent for treatment and assignment of benefits for services provided during this visit. Patient/Guardian expressed understanding and agreed to proceed.   1. Bipolar 1 disorder, mixed, full remission (HCC)  - oxcarbazepine (TRILEPTAL) 600 MG tablet; Take 1 tablet (600 mg total) by mouth 2 (two) times daily.  Dispense: 60 tablet; Refill: 2 - QUEtiapine (SEROQUEL) 200 MG tablet; Take 1 tablet (200 mg total) by mouth at bedtime.  Dispense: 30 tablet; Refill: 2 - benztropine (COGENTIN) 0.5 MG tablet; Take 1 tablet (0.5 mg total) by mouth at bedtime.  Dispense: 30 tablet; Refill: 2  2. Anxiety  - gabapentin (NEURONTIN) 300 MG capsule; Take 1 capsule (300 mg total) by mouth 3 (three) times daily.  Dispense: 90 capsule; Refill: 2 - hydrOXYzine (ATARAX) 25 MG tablet; Take 1 tablet (25 mg total) by mouth 2 (two) times daily as needed.  Dispense: 60 tablet; Refill: 1  Patient to follow-up in 2 months Provider spent a total of 16 minutes with the patient/reviewing patient's chart  Meta Hatchet, PA 10/23/2023, 7:40 PM

## 2023-10-26 ENCOUNTER — Ambulatory Visit (HOSPITAL_COMMUNITY): Payer: No Payment, Other | Admitting: Clinical

## 2023-10-26 DIAGNOSIS — F3178 Bipolar disorder, in full remission, most recent episode mixed: Secondary | ICD-10-CM

## 2023-10-28 NOTE — Progress Notes (Signed)
   THERAPIST PROGRESS NOTE Virtual Visit via Video Note  I connected with George [name] on 10/26/2023 at 10:00 AM EST by a video enabled telemedicine application and verified that I am speaking with the correct person using two identifiers.  Location: Patient: home Provider: office   I discussed the limitations of evaluation and management by telemedicine and the availability of in person appointments. The patient expressed understanding and agreed to proceed.   Follow Up Instructions: I discussed the assessment and treatment plan with the patient. The patient was provided an opportunity to ask questions and all were answered. The patient agreed with the plan and demonstrated an understanding of the instructions.   The patient was advised to call back or seek an in-person evaluation if the symptoms worsen or if the condition fails to improve as anticipated.   Session Time: 40 minutes  Participation Level: Active  Behavioral Response: CasualAlertEuthymic  Type of Therapy: Individual Therapy  Treatment Goals addressed: George [name] WILL IDENTIFY COGNITIVE PATTERNS AND BELIEFS THAT INTERFERE WITH THERAPY ONCE PER SESSION   ProgressTowards Goals: Progressing  Interventions: CBT and Supportive  Summary:  George [name] is a 32 y.o. male who presents for the scheduled appointment oriented times five, appropriately dressed and friendly. Client denied hallucinations and delusions. Client reported he is doing well. Client reported he has had a positive reflection of his progress since his last psychiatry appointment. Client reported he sees his process with medication regimen as a tool for him to do well. Client reported when he was younger people told his mom he would have to be in a group home or be unstable. Client reported he beat all the odds against him. Client reported he is able to keep a job and has made a family of his own. Client reported he is making better choices with what he  gives a lot of attention to. Evidence of progress towards goal:  client reported 1 positive of medication compliance 7 days per week. Client reported 1 ability of being able to regulate his emotions and not react to every trigger.   Suicidal/Homicidal: Nowithout intent/plan  Therapist Response:  Therapist began the appointment asking the client how he has been doing. Therapist used cbt to engage with active listening and positive emotional support. Therapist used cbt to to give the client time to discuss his thoughts about his progress through therapy. Therapist used cbt to continue teaching the client about emotion regulation. Therapist used CBT ask the client to identify his progress with frequency of use with coping skills with continued practice in his daily activity.    Therapist assigned the client homework to practice self care.   Plan: Return again in 4 weeks.  Diagnosis: bipolar 1 mixed full remission  Collaboration of Care: Patient refused AEB none requested.  Patient/Guardian was advised Release of Information must be obtained prior to any record release in order to collaborate their care with an outside provider. Patient/Guardian was advised if they have not already done so to contact the registration department to sign all necessary forms in order for [place] to release information regarding their care.   Consent: Patient/Guardian gives verbal consent for treatment and assignment of benefits for services provided during this visit. Patient/Guardian expressed understanding and agreed to proceed.   George Rhymes Meriah Shands, LCSW 10/28/2023

## 2023-11-13 ENCOUNTER — Ambulatory Visit (HOSPITAL_COMMUNITY): Payer: No Payment, Other | Admitting: Clinical

## 2023-11-21 ENCOUNTER — Ambulatory Visit (INDEPENDENT_AMBULATORY_CARE_PROVIDER_SITE_OTHER): Payer: No Payment, Other | Admitting: Clinical

## 2023-11-21 DIAGNOSIS — F3178 Bipolar disorder, in full remission, most recent episode mixed: Secondary | ICD-10-CM | POA: Diagnosis not present

## 2023-11-21 NOTE — Progress Notes (Signed)
   THERAPIST PROGRESS NOTE  Session Time: 40 minutes  Participation Level: Active  Behavioral Response: CasualAlertEuthymic  Type of Therapy: Individual Therapy  Treatment Goals addressed: [name] WILL IDENTIFY COGNITIVE PATTERNS AND BELIEFS THAT INTERFERE WITH THERAPY ONCE PER SESSIO   ProgressTowards Goals: Progressing  Interventions: CBT  Summary:  George [name] is a 33 y.o. male who presents for the scheduled appointment oriented x 5, appropriately dressed, and friendly.  Client denied hallucinations and delusions. Client reported on today he is doing pretty well but has been processing thoughts and emotions related to how he copes with being triggered and communicating with others.  Client reported over time he has understood a part of his regimen is using his medications to help alleviate the intensity of his mood.  Client reported going back and forth with himself about feeling negative client having to take anxiety medications on certain days during the day because of something that was upsetting to him and needing assistance with calming down.  Client reported while some people were close to him understand that it is somewhat of a challenge when he has to walk away from others to go cool off or take medication.  Client reported also within his relationship communicating with his fiance can be a challenge as she misinterprets things on her own without getting clarity from him on certain topics. Evidence of progress towards goal: Client reported he is medication compliant 7 days a week.  Suicidal/Homicidal: Nowithout intent/plan  Therapist Response:  Therapist began the appointment asking the client how he is doing well since last seen. Therapist used CBT to engage with active listening and positive emotional support. Therapist used CBT to give the client time to discuss his thoughts and feelings pertaining to triggers for irritability and anxiety. Therapist used CBT to  normalize the clients thoughts and feelings. Therapist used CBT to positively encouraged clients medication compliance and normalizing a regimen that helps to keep him feeling stable. Therapist used CBT ask the client to identify his progress with frequency of use with coping skills with continued practice in his daily activity.    Therapist assigned client homework to practice self-care.  Plan: Return again in 4 weeks.  Diagnosis: Bipolar 1 disorder, mixed, full remission  Collaboration of Care: Patient refused AEB none requested by the client.  Patient/Guardian was advised Release of Information must be obtained prior to any record release in order to collaborate their care with an outside provider. Patient/Guardian was advised if they have not already done so to contact the registration department to sign all necessary forms in order for [place] to release information regarding their care.   Consent: Patient/Guardian gives verbal consent for treatment and assignment of benefits for services provided during this visit. Patient/Guardian expressed understanding and agreed to proceed.   Neena Rhymes Raywood Wailes, LCSW 11/21/2023

## 2023-12-04 ENCOUNTER — Ambulatory Visit (HOSPITAL_COMMUNITY): Payer: No Payment, Other | Admitting: Clinical

## 2023-12-18 ENCOUNTER — Telehealth (INDEPENDENT_AMBULATORY_CARE_PROVIDER_SITE_OTHER): Payer: No Payment, Other | Admitting: Physician Assistant

## 2023-12-18 ENCOUNTER — Encounter (HOSPITAL_COMMUNITY): Payer: Self-pay | Admitting: Physician Assistant

## 2023-12-18 DIAGNOSIS — F3178 Bipolar disorder, in full remission, most recent episode mixed: Secondary | ICD-10-CM

## 2023-12-18 DIAGNOSIS — F419 Anxiety disorder, unspecified: Secondary | ICD-10-CM | POA: Diagnosis not present

## 2023-12-18 MED ORDER — GABAPENTIN 300 MG PO CAPS
300.0000 mg | ORAL_CAPSULE | Freq: Three times a day (TID) | ORAL | 3 refills | Status: DC
Start: 2023-12-18 — End: 2024-03-11

## 2023-12-18 MED ORDER — BENZTROPINE MESYLATE 0.5 MG PO TABS
0.5000 mg | ORAL_TABLET | Freq: Every day | ORAL | 3 refills | Status: DC
Start: 2023-12-18 — End: 2024-03-11

## 2023-12-18 MED ORDER — HYDROXYZINE HCL 25 MG PO TABS: 25.0000 mg | ORAL_TABLET | Freq: Two times a day (BID) | ORAL | 2 refills | Status: DC | PRN

## 2023-12-18 MED ORDER — QUETIAPINE FUMARATE 200 MG PO TABS: 200.0000 mg | ORAL_TABLET | Freq: Every day | ORAL | 3 refills | Status: DC

## 2023-12-18 MED ORDER — OXCARBAZEPINE 600 MG PO TABS
600.0000 mg | ORAL_TABLET | Freq: Two times a day (BID) | ORAL | 3 refills | Status: DC
Start: 2023-12-18 — End: 2024-03-11

## 2023-12-18 NOTE — Progress Notes (Unsigned)
BH MD/PA/NP OP Progress Note  Virtual Visit via Video Note  I connected with George [name] on 12/18/23 at 10:30 AM EST by a video enabled telemedicine application and verified that I am speaking with the correct person using two identifiers.  Location: Patient: Home Provider: Clinic   I discussed the limitations of evaluation and management by telemedicine and the availability of in person appointments. The patient expressed understanding and agreed to proceed.  Follow Up Instructions:  I discussed the assessment and treatment plan with the patient. The patient was provided an opportunity to ask questions and all were answered. The patient agreed with the plan and demonstrated an understanding of the instructions.   The patient was advised to call back or seek an in-person evaluation if the symptoms worsen or if the condition fails to improve as anticipated.  I provided 8 minutes of non-face-to-face time during this encounter.  Meta Hatchet, PA    12/18/2023 6:26 PM George [name]  MRN:  [id number]  Chief Complaint:  Chief Complaint  Patient presents with   Follow-up   Medication Refill   HPI:   George [name] is a 33 year old, African-American male with a past psychiatric history significant for bipolar 1 disorder, anxiety, and cannabis use disorder who presents to [facility] via virtual video visit for follow-up and medication management.  Patient is currently being managed on the following psychiatric medications:  Gabapentin 300 mg 3 times daily Cogentin 0.5 mg at bedtime Seroquel 200 mg at bedtime Oxcarbazepine 600 mg 2 times daily Hydroxyzine 25 mg 2 times daily as needed  Patient reports that he continues to take his medications regularly.  Patient denies having any complaints regarding his current medication regimen and denies experiencing any adverse side effects.  Patient denies overt depressive symptoms nor does  he endorse anxiety.  He further denies experiencing manic symptoms recently.  Patient denies any new stressors at this time and states that he recently changed shifts at work which has been helpful to him.  Patient is alert and oriented x 4, calm, cooperative, and fully engaged in conversation during the encounter.  Patient endorses good mood.  Patient denies suicidal or homicidal ideations.  He further denies auditory or visual hallucinations and does not appear to be responding to internal/external stimuli.  Patient endorses good sleep and receives on average 6 to 7 hours of sleep per night.  Patient endorses good appetite and eats on average 2 meals per day along with snacking.  Patient denies alcohol consumption.  Patient denies tobacco use but does engage in vaping.  Patient endorses illicit drug use in the form of marijuana.  Visit Diagnosis:    ICD-10-CM   1. Bipolar 1 disorder, mixed, full remission (HCC)  F31.78 oxcarbazepine (TRILEPTAL) 600 MG tablet    QUEtiapine (SEROQUEL) 200 MG tablet    benztropine (COGENTIN) 0.5 MG tablet    2. Anxiety  F41.9 gabapentin (NEURONTIN) 300 MG capsule    hydrOXYzine (ATARAX) 25 MG tablet      Past Psychiatric History:  Bipolar 1 disorder   History of several emergency room visits as well as psychiatry hospitalizations in the past.  Patient was [facility] from [facility] on 09/23/2022  Past Medical History:  Past Medical History:  Diagnosis Date   ADHD (attention deficit hyperactivity disorder)    Ankle fracture 01/2017   RIGHT ANKLE   Anxiety    Bipolar disorder (HCC)    Depression  Sleep disorder 04/30/2014    Past Surgical History:  Procedure Laterality Date   NO PAST SURGERIES     ORIF ANKLE FRACTURE Right 01/31/2017   Procedure: OPEN REDUCTION INTERNAL FIXATION (ORIF) RIGHT TRIMALLEOLAR ANKLE FRACTURE;  Surgeon: [name], MD;  Location: [facility] OR;  Service: Orthopedics;  Laterality: Right;    Family Psychiatric History:   No reported family history of psychiatric illness   Family History:  Family History  Problem Relation Age of Onset   Cancer Father    Diabetes Maternal Grandmother    Alzheimer's disease Paternal Grandmother    Heart failure Paternal Grandmother    Heart failure Paternal Grandfather    Heart failure Maternal Grandfather     Social History:  Social History   Socioeconomic History   Marital status: Single    Spouse name: Not on file   Number of children: Not on file   Years of education: Not on file   Highest education level: Not on file  Occupational History   Not on file  Tobacco Use   Smoking status: Every Day    Current packs/day: 1.00    Average packs/day: 1 pack/day for 10.0 years (10.0 ttl pk-yrs)    Types: Cigarettes   Smokeless tobacco: Never   Tobacco comments:    off and on  Substance and Sexual Activity   Alcohol use: Yes   Drug use: Yes    Types: Marijuana, Cocaine    Comment: heroin   Sexual activity: Not on file    Comment: Unknown  Other Topics Concern   Not on file  Social History Narrative   Not on file   Social Drivers of Health   Financial Resource Strain: Not on file  Food Insecurity: Not on file  Transportation Needs: Not on file  Physical Activity: Not on file  Stress: Not on file  Social Connections: Not on file    Allergies:  Allergies  Allergen Reactions   Amoxicillin Anaphylaxis, Itching, Swelling and Rash    "throat started to close up" Has patient had a PCN reaction causing immediate rash, facial/tongue/throat swelling, SOB or lightheadedness with hypotension: Yes Has patient had a PCN reaction causing severe rash involving mucus membranes or skin necrosis: Yes Has patient had a PCN reaction that required hospitalization No Has patient had a PCN reaction occurring within the last 10 years: No If all of the above answers are "NO", then may proceed with Cephalosporin use.    Penicillins Anaphylaxis, Itching, Rash and Other  (See Comments)    Has patient had a PCN reaction causing immediate rash, facial/tongue/throat swelling, SOB or lightheadedness with hypotension: unsure Has patient had a PCN reaction causing severe rash involving mucus membranes or skin necrosis: Unsure Has patient had a PCN reaction that required hospitalization Unsure Has patient had a PCN reaction occurring within the last 10 years: No If all of the above answers are "NO", then may proceed with Cephalosporin use.    Metabolic Disorder Labs: Lab Results  Component Value Date   HGBA1C 6.2 (H) 12/10/2015   MPG 131 12/10/2015   Lab Results  Component Value Date   PROLACTIN 28.2 (H) 12/10/2015   Lab Results  Component Value Date   CHOL 119 12/10/2015   TRIG 91 12/10/2015   HDL 34 (L) 12/10/2015   CHOLHDL 3.5 12/10/2015   VLDL 18 12/10/2015   LDLCALC 67 12/10/2015   Lab Results  Component Value Date   TSH 1.744 12/10/2015    Therapeutic Level  Labs: No results found for: "LITHIUM" No results found for: "VALPROATE" No results found for: "CBMZ"  Current Medications: Current Outpatient Medications  Medication Sig Dispense Refill   benztropine (COGENTIN) 0.5 MG tablet Take 1 tablet (0.5 mg total) by mouth at bedtime. 30 tablet 3   gabapentin (NEURONTIN) 300 MG capsule Take 1 capsule (300 mg total) by mouth 3 (three) times daily. 90 capsule 3   GINSENG PO Take 1 tablet by mouth daily.     hydrOXYzine (ATARAX) 25 MG tablet Take 1 tablet (25 mg total) by mouth 2 (two) times daily as needed. 60 tablet 2   OVER THE COUNTER MEDICATION Take 1 tablet by mouth daily. Ginkgo Biloba     OVER THE COUNTER MEDICATION Take 2 tablets by mouth daily. L arginine PO     oxcarbazepine (TRILEPTAL) 600 MG tablet Take 1 tablet (600 mg total) by mouth 2 (two) times daily. 60 tablet 3   QUEtiapine (SEROQUEL) 200 MG tablet Take 1 tablet (200 mg total) by mouth at bedtime. 30 tablet 3   No current facility-administered medications for this visit.      Musculoskeletal: Strength & Muscle Tone: within normal limits Gait & Station: normal Patient leans: N/A  Psychiatric Specialty Exam: Review of Systems  Psychiatric/Behavioral:  Negative for decreased concentration, dysphoric mood, hallucinations, self-injury, sleep disturbance and suicidal ideas. The patient is not nervous/anxious and is not hyperactive.     There were no vitals taken for this visit.There is no height or weight on file to calculate BMI.  General Appearance: Casual  Eye Contact:  Good  Speech:  Clear and Coherent and Normal Rate  Volume:  Normal  Mood:  Euthymic  Affect:  Appropriate  Thought Process:  Coherent and Descriptions of Associations: Intact  Orientation:  Full (Time, Place, and Person)  Thought Content: WDL   Suicidal Thoughts:  No  Homicidal Thoughts:  No  Memory:  Immediate;   Good Recent;   Good Remote;   Good  Judgement:  Good  Insight:  Good  Psychomotor Activity:  Normal  Concentration:  Concentration: Good and Attention Span: Good  Recall:  Good  Fund of Knowledge: Good  Language: Good  Akathisia:  No  Handed:  Right  AIMS (if indicated): not done  Assets:  Communication Skills Desire for Improvement Housing Intimacy Physical Health Resilience Social Support Vocational/Educational  ADL's:  Intact  Cognition: WNL  Sleep:  Good   Screenings: AIMS    Flowsheet Row [facility]  AIMS Total Score 0      AUDIT    Flowsheet Row [facility]  Alcohol Use Disorder Identification Test Final Score (AUDIT) 0      GAD-7    Flowsheet Row [facility] Video Visit from 10/23/2023 in [facility] Video Visit from 06/29/2023 in [facility] Clinical Support from 03/22/2023 in  [facility] Clinical Support from 01/18/2023 in [facility]  Total GAD-7 Score 0 0 0 0 0      PHQ2-9    Flowsheet Row Video Visit from 12/18/2023 in [facility] Video Visit from 10/23/2023 in [facility] Video Visit from 08/31/2023 in [facility] Video Visit from 06/29/2023 in [facility] Clinical Support from 03/22/2023 in  [facility]  PHQ-2 Total Score 0 0 0 0 0      Flowsheet Row Video Visit from 12/18/2023 in [facility] Video Visit from 10/23/2023 in [facility] Video Visit from 08/31/2023 in [facility]  C-SSRS RISK CATEGORY Moderate Risk Moderate Risk Moderate Risk        Assessment and Plan:   George [name] is a 33 year old, African-American male with a past psychiatric history significant for bipolar 1 disorder, anxiety, and cannabis use disorder who presents to [facility] via virtual video visit for follow-up and medication management.  Patient presents today encounter reporting no issues or concerns regarding his current medication regimen.  Patient denies experiencing any adverse side effects and denies the need for dosage adjustments at this time.  Patient denies experiencing manic symptoms.  He denies experiencing overt depressive symptoms nor does he endorse anxiety.  Patient endorses stability on his current medication regimen and would like to continue taking them as prescribed.  Patient's medications to be prescribed to pharmacy of choice.  Provider encouraged marijuana cessation.  Provider informed patient that the following labs would need to be obtained due to his use of Seroquel: Lipid panel, complete metabolic panel, complete blood count with  differential, and hemoglobin A1c.  Patient vocalized understanding but states that he does not have insurance at this time.  Provider to see if patient qualifies for Medicaid.  Collaboration of Care: Collaboration of Care: Medication Management AEB provider managing patient's psychiatric medications, Psychiatrist AEB patient being followed by mental health provider, and Referral or follow-up with counselor/therapist AEB patient being seen by a licensed clinical social worker at this facility  Patient/Guardian was advised Release of Information must be obtained prior to any record release in order to collaborate their care with an outside provider. Patient/Guardian was advised if they have not already done so to contact the registration department to sign all necessary forms in order for [place] to release information regarding their care.   Consent: Patient/Guardian gives verbal consent for treatment and assignment of benefits for services provided during this visit. Patient/Guardian expressed understanding and agreed to proceed.   1. Bipolar 1 disorder, mixed, full remission (HCC)  - oxcarbazepine (TRILEPTAL) 600 MG tablet; Take 1 tablet (600 mg total) by mouth 2 (two) times daily.  Dispense: 60 tablet; Refill: 3 - QUEtiapine (SEROQUEL) 200 MG tablet; Take 1 tablet (200 mg total) by mouth at bedtime.  Dispense: 30 tablet; Refill: 3 - benztropine (COGENTIN) 0.5 MG tablet; Take 1 tablet (0.5 mg total) by mouth at bedtime.  Dispense: 30 tablet; Refill: 3  2. Anxiety  - gabapentin (NEURONTIN) 300 MG capsule; Take 1 capsule (300 mg total) by mouth 3 (three) times daily.  Dispense: 90 capsule; Refill: 3 - hydrOXYzine (ATARAX) 25 MG tablet; Take 1 tablet (25 mg total) by mouth 2 (two) times daily as needed.  Dispense: 60 tablet; Refill: 2  Patient to follow-up in 2 months Provider spent a total of 8 minutes with the patient/reviewing patient's chart  Meta Hatchet, PA 12/18/2023, 6:26 PM

## 2023-12-25 ENCOUNTER — Ambulatory Visit (HOSPITAL_COMMUNITY): Payer: No Payment, Other | Admitting: Clinical

## 2024-03-11 ENCOUNTER — Telehealth (HOSPITAL_COMMUNITY): Payer: No Payment, Other | Admitting: Physician Assistant

## 2024-03-11 ENCOUNTER — Encounter (HOSPITAL_COMMUNITY): Payer: Self-pay | Admitting: Physician Assistant

## 2024-03-11 DIAGNOSIS — F419 Anxiety disorder, unspecified: Secondary | ICD-10-CM

## 2024-03-11 DIAGNOSIS — Z79899 Other long term (current) drug therapy: Secondary | ICD-10-CM | POA: Insufficient documentation

## 2024-03-11 DIAGNOSIS — F3178 Bipolar disorder, in full remission, most recent episode mixed: Secondary | ICD-10-CM | POA: Diagnosis not present

## 2024-03-11 MED ORDER — QUETIAPINE FUMARATE 200 MG PO TABS: 200.0000 mg | ORAL_TABLET | Freq: Every day | ORAL | 2 refills | Status: DC

## 2024-03-11 MED ORDER — GABAPENTIN 300 MG PO CAPS: 300.0000 mg | ORAL_CAPSULE | Freq: Three times a day (TID) | ORAL | 2 refills | Status: DC

## 2024-03-11 MED ORDER — BENZTROPINE MESYLATE 0.5 MG PO TABS
0.5000 mg | ORAL_TABLET | Freq: Every day | ORAL | 2 refills | Status: DC
Start: 2024-03-11 — End: 2024-05-14

## 2024-03-11 MED ORDER — HYDROXYZINE HCL 25 MG PO TABS: 25.0000 mg | ORAL_TABLET | Freq: Three times a day (TID) | ORAL | 2 refills | Status: DC | PRN

## 2024-03-11 MED ORDER — OXCARBAZEPINE 600 MG PO TABS: 600.0000 mg | ORAL_TABLET | Freq: Two times a day (BID) | ORAL | 2 refills | Status: DC

## 2024-03-11 NOTE — Progress Notes (Signed)
 [facility] MD/PA/NP OP Progress Note  Virtual Visit via Video Note  I connected with George [name] on 03/11/24 at  1:00 PM EDT by a video enabled telemedicine application and verified that I am speaking with the correct person using two identifiers.  Location: Patient: Home Provider: Clinic   I discussed the limitations of evaluation and management by telemedicine and the availability of in person appointments. The patient expressed understanding and agreed to proceed.  Follow Up Instructions:  I discussed the assessment and treatment plan with the patient. The patient was provided an opportunity to ask questions and all were answered. The patient agreed with the plan and demonstrated an understanding of the instructions.   The patient was advised to call back or seek an in-person evaluation if the symptoms worsen or if the condition fails to improve as anticipated.  I provided 16 minutes of non-face-to-face time during this encounter.  Gates Kasal, PA    03/11/2024 4:24 PM George [name]  MRN:  [id number]  Chief Complaint:  Chief Complaint  Patient presents with   Follow-up   Medication Refill   HPI:   George [name] is a 33 year old, African-American male with a past psychiatric history significant for bipolar 1 disorder, anxiety, and cannabis use disorder who presents to [facility] via virtual video visit for follow-up and medication management.  Patient is currently being managed on the following psychiatric medications:  Gabapentin  300 mg 3 times daily Cogentin  0.5 mg at bedtime Seroquel  200 mg at bedtime Oxcarbazepine  600 mg 2 times daily Hydroxyzine  25 mg 2 times daily as needed  Patient presents to the encounter stating that he continues to take his medications regularly.  Patient would like for his hydroxyzine  dosage to be adjusted from 2 times daily to 3 times daily as needed.  Due to the amount of stress he is under,  patient reports that he normally takes 2 tablets a day.  Since he takes 2 tablets daily, patient reports that he is unable to take a third dose when he has an emergency.  Patient denies any other issues or concerns regarding his current medication regimen.  Patient continues to endorse anxiety and rates his anxiety at a 1 out of 10.  Patient attributes his anxiety to family and work related stressors. Patient denies experiencing manic symptoms.  He further denies overt depressive symptoms at this time.  A GAD-7 screen was performed with the patient scoring at 3.  Patient is alert and oriented x 4, calm, cooperative, and fully engaged in conversation during the encounter.  Patient endorses good mood.  Patient exhibits euthymic mood with appropriate affect.  Patient denies suicidal or homicidal ideations.  He further denies auditory or visual hallucinations and does not appear to be responding to internal/external stimuli.  Patient endorses good sleep and receives on average 6 to 7 hours of sleep per night.  Patient endorses good appetite and eats on average to full meals per day along with snacking during the day.  Patient denies alcohol consumption.  Patient denies tobacco use but does engage in vaping.  Patient endorses illicit drug use in the form of marijuana.  Visit Diagnosis:    ICD-10-CM   1. Long term current use of antipsychotic medication  Z79.899 Lipid panel    Hemoglobin A1c    CBC with Differential/Platelet    Comprehensive Metabolic Panel (CMET)    2. Anxiety  F41.9 hydrOXYzine  (ATARAX ) 25 MG tablet    gabapentin  (  NEURONTIN ) 300 MG capsule    3. Bipolar 1 disorder, mixed, full remission (HCC)  F31.78 oxcarbazepine  (TRILEPTAL ) 600 MG tablet    benztropine  (COGENTIN ) 0.5 MG tablet      Past Psychiatric History:  Bipolar 1 disorder   History of several emergency room visits as well as psychiatry hospitalizations in the past.  Patient was recently discharged from [facility] on  09/23/2022  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    Past Surgical History:  Procedure Laterality Date   NO PAST SURGERIES     ORIF ANKLE FRACTURE Right 01/31/2017   Procedure: OPEN REDUCTION INTERNAL FIXATION (ORIF) RIGHT TRIMALLEOLAR ANKLE FRACTURE;  Surgeon: Wes Hamman, MD;  Location: [facility] OR;  Service: Orthopedics;  Laterality: Right;    Family Psychiatric History:  No reported family history of psychiatric illness   Family History:  Family History  Problem Relation Age of Onset   Cancer Father    Diabetes Maternal Grandmother    Alzheimer's disease Paternal Grandmother    Heart failure Paternal Grandmother    Heart failure Paternal Grandfather    Heart failure Maternal Grandfather     Social History:  Social History   Socioeconomic History   Marital status: Single    Spouse name: Not on file   Number of children: Not on file   Years of education: Not on file   Highest education level: Not on file  Occupational History   Not on file  Tobacco Use   Smoking status: Every Day    Current packs/day: 1.00    Average packs/day: 1 pack/day for 10.0 years (10.0 ttl pk-yrs)    Types: Cigarettes   Smokeless tobacco: Never   Tobacco comments:    off and on  Substance and Sexual Activity   Alcohol use: Yes   Drug use: Yes    Types: Marijuana, Cocaine    Comment: heroin   Sexual activity: Not on file    Comment: Unknown  Other Topics Concern   Not on file  Social History Narrative   Not on file   Social Drivers of Health   Financial Resource Strain: Not on file  Food Insecurity: Not on file  Transportation Needs: Not on file  Physical Activity: Not on file  Stress: Not on file  Social Connections: Not on file    Allergies:  Allergies  Allergen Reactions    Amoxicillin Anaphylaxis, Itching, Swelling and Rash    "throat started to close up" Has patient had a PCN reaction causing immediate rash, facial/tongue/throat swelling, SOB or lightheadedness with hypotension: Yes Has patient had a PCN reaction causing severe rash involving mucus membranes or skin necrosis: Yes Has patient had a PCN reaction that required hospitalization No Has patient had a PCN reaction occurring within the last 10 years: No If all of the above answers are "NO", then may proceed with Cephalosporin use.    Penicillins Anaphylaxis, Itching, Rash and Other (See Comments)    Has patient had a PCN reaction causing immediate rash, facial/tongue/throat swelling, SOB or lightheadedness with hypotension: unsure Has patient had a PCN reaction causing severe rash involving mucus membranes or skin necrosis: Unsure Has patient had a PCN reaction that required hospitalization Unsure Has patient had a PCN reaction occurring within the  last 10 years: No If all of the above answers are "NO", then may proceed with Cephalosporin use.    Metabolic Disorder Labs: Lab Results  Component Value Date   HGBA1C 6.2 (H) 12/10/2015   MPG 131 12/10/2015   Lab Results  Component Value Date   PROLACTIN 28.2 (H) 12/10/2015   Lab Results  Component Value Date   CHOL 119 12/10/2015   TRIG 91 12/10/2015   HDL 34 (L) 12/10/2015   CHOLHDL 3.5 12/10/2015   VLDL 18 12/10/2015   LDLCALC 67 12/10/2015   Lab Results  Component Value Date   TSH 1.744 12/10/2015    Therapeutic Level Labs: No results found for: "LITHIUM" No results found for: "VALPROATE" No results found for: "CBMZ"  Current Medications: Current Outpatient Medications  Medication Sig Dispense Refill   benztropine  (COGENTIN ) 0.5 MG tablet Take 1 tablet (0.5 mg total) by mouth at bedtime. 30 tablet 2   gabapentin  (NEURONTIN ) 300 MG capsule Take 1 capsule (300 mg total) by mouth 3 (three) times daily. 90 capsule 2   GINSENG PO  Take 1 tablet by mouth daily.     hydrOXYzine  (ATARAX ) 25 MG tablet Take 1 tablet (25 mg total) by mouth 3 (three) times daily as needed. 90 tablet 2   OVER THE COUNTER MEDICATION Take 1 tablet by mouth daily. Ginkgo Biloba     OVER THE COUNTER MEDICATION Take 2 tablets by mouth daily. L arginine PO     oxcarbazepine  (TRILEPTAL ) 600 MG tablet Take 1 tablet (600 mg total) by mouth 2 (two) times daily. 60 tablet 2   QUEtiapine  (SEROQUEL ) 200 MG tablet Take 1 tablet (200 mg total) by mouth at bedtime. 30 tablet 3   No current facility-administered medications for this visit.     Musculoskeletal: Strength & Muscle Tone: within normal limits Gait & Station: normal Patient leans: N/A  Psychiatric Specialty Exam: Review of Systems  Psychiatric/Behavioral:  Negative for decreased concentration, dysphoric mood, hallucinations, self-injury, sleep disturbance and suicidal ideas. The patient is not nervous/anxious and is not hyperactive.     There were no vitals taken for this visit.There is no height or weight on file to calculate BMI.  General Appearance: Casual  Eye Contact:  Good  Speech:  Clear and Coherent and Normal Rate  Volume:  Normal  Mood:  Euthymic  Affect:  Appropriate  Thought Process:  Coherent and Descriptions of Associations: Intact  Orientation:  Full (Time, Place, and Person)  Thought Content: WDL   Suicidal Thoughts:  No  Homicidal Thoughts:  No  Memory:  Immediate;   Good Recent;   Good Remote;   Good  Judgement:  Good  Insight:  Good  Psychomotor Activity:  Normal  Concentration:  Concentration: Good and Attention Span: Good  Recall:  Good  Fund of Knowledge: Good  Language: Good  Akathisia:  No  Handed:  Right  AIMS (if indicated): done; 0  Assets:  Communication Skills Desire for Improvement Housing Intimacy Physical Health Resilience Social Support Vocational/Educational  ADL's:  Intact  Cognition: WNL  Sleep:  Good   Screenings: AIMS     Flowsheet Row Video Visit from 03/11/2024 in [facility]  AIMS Total Score 0 0      AUDIT    Flowsheet Row [facility]  Alcohol Use Disorder Identification Test Final Score (AUDIT) 0  GAD-7    Flowsheet Row Video Visit from 03/11/2024 in [facility] Video Visit from 12/18/2023 in [facility] Video Visit from 10/23/2023 in [facility] Video Visit from 06/29/2023 in [facility] Clinical Support from 03/22/2023 in [facility]  Total GAD-7 Score 3 0 0 0 0      PHQ2-9    Flowsheet Row Video Visit from 03/11/2024 in [facility] Video Visit from 12/18/2023 in [facility] Video Visit from 10/23/2023 in [facility] Video Visit from 08/31/2023 in [facility] Video Visit from 06/29/2023 in [facility]  PHQ-2 Total Score 0 0 0 0 0      Flowsheet Row Video Visit from 03/11/2024 in [facility] Video Visit from 12/18/2023 in [facility] Video Visit from 10/23/2023 in [facility]  C-SSRS RISK CATEGORY Moderate Risk Moderate Risk Moderate Risk        Assessment and Plan:   George [name] is a 33 year old, African-American male with a past psychiatric history significant for bipolar 1 disorder, anxiety, and cannabis use disorder who presents to [facility] via virtual video visit for follow-up and medication management.  Patient presents to the encounter stating that he continues to take his medications  regularly.  Patient continues to endorse some anxiety attributed to family and work related stressors.  A GAD-7 screen was performed with the patient scoring at 3.  He reports that he would like to have his hydroxyzine  dosage adjusted from 2 times daily to 3 times daily as needed for the management of his anxiety.  Patient would like to continue taking his other medications as prescribed.  Patient's medications to be e-prescribed to pharmacy of choice.  Patient denies manic symptoms and further denies overt depressive symptoms at this time.  Patient denies experiencing any involuntary movements from the use of the Seroquel .  An aims assessment was performed with the patient scoring a zero.  Due to patient's use of Seroquel , patient to have the following labs obtained: Hemoglobin A1c, comprehensive metabolic panel, lipid profile, and complete blood count with differential.  Patient vocalized understanding.  A [place] Suicide Severity Rating Scale was performed with the patient being considered moderate risk.  Patient denies suicidal ideations and was able to contract for safety following the conclusion of the encounter.  Collaboration of Care: Collaboration of Care: Medication Management AEB provider managing patient's psychiatric medications, Psychiatrist AEB patient being followed by mental health provider, and Referral or follow-up with counselor/therapist AEB patient being seen by a licensed clinical social worker at this facility  Patient/Guardian was advised Release of Information must be obtained prior to any record release in order to collaborate their care with an outside provider. Patient/Guardian was advised if they have not already done so to contact the registration department to sign all necessary forms in order for us  to release information regarding their care.   Consent: Patient/Guardian gives verbal consent for treatment and assignment of benefits for services provided during this  visit. Patient/Guardian expressed understanding and agreed to proceed.   1. Anxiety  - hydrOXYzine  (ATARAX ) 25 MG tablet; Take 1 tablet (25 mg total) by mouth 3 (three) times daily as needed.  Dispense: 90 tablet; Refill: 2 - gabapentin  (NEURONTIN ) 300 MG capsule; Take 1 capsule (300 mg total)  by mouth 3 (three) times daily.  Dispense: 90 capsule; Refill: 2  2. Bipolar 1 disorder, mixed, full remission (HCC)  - oxcarbazepine  (TRILEPTAL ) 600 MG tablet; Take 1 tablet (600 mg total) by mouth 2 (two) times daily.  Dispense: 60 tablet; Refill: 2 - benztropine  (COGENTIN ) 0.5 MG tablet; Take 1 tablet (0.5 mg total) by mouth at bedtime.  Dispense: 30 tablet; Refill: 2 - QUEtiapine  (SEROQUEL ) 200 MG tablet; Take 1 tablet (200 mg total) by mouth at bedtime.  Dispense: 30 tablet; Refill: 2  3. Long term current use of antipsychotic medication (Primary)  - Lipid panel; Future - Hemoglobin A1c; Future - CBC with Differential/Platelet; Future - Comprehensive Metabolic Panel (CMET); Future  Patient to follow-up in 2 months Provider spent a total of 16 minutes with the patient/reviewing patient's chart  Gates Kasal, PA 03/11/2024, 4:24 PM

## 2024-04-16 ENCOUNTER — Ambulatory Visit (INDEPENDENT_AMBULATORY_CARE_PROVIDER_SITE_OTHER): Admitting: Clinical

## 2024-04-16 DIAGNOSIS — F3178 Bipolar disorder, in full remission, most recent episode mixed: Secondary | ICD-10-CM | POA: Diagnosis not present

## 2024-04-16 NOTE — Progress Notes (Signed)
   THERAPIST PROGRESS NOTE  Session Time: 60 minutes  Participation Level: Active  Behavioral Response: CasualAlertEuthymic  Type of Therapy: Individual Therapy  Treatment Goals addressed:  [name] WILL IDENTIFY COGNITIVE PATTERNS AND BELIEFS THAT INTERFERE WITH THERAPY ONCE PER SESSION   ProgressTowards Goals: Progressing  Interventions: CBT and Supportive  Summary:  George [name] is a 33 y.o. male who presents to the scheduled appointment oriented x 5, appropriately dressed, and cooperative.  Client denied hallucinations and delusions. Client reported there was a incident at work where he could have been demoted but he wasn't. Client reported instead he was switched to a different shift. Client reported work is fine overall. Client reported he got married to his fiance' and it went well with both their families. Client reported he takes hydroxyzine  to help him think and talk things through. Client reported something he noticed through marriage counseling to continue respectfully communicating issues. Client reported he has been going to the [employer] [facility] support group as well which is nice because he doesn't he doesn't have friends or people to talk to about mental health or understand where he come from. Client reported he is learning to be a support person to his wife as she is newly going to a independent therapist. Client reported he used to being supported and not the supportive person. Client reported he has been thinking about friendships. Client reported he is fine being social but does not feel the need to have a big group around him. Client reported he has been at his job at [employer] for 5 years and he feels proud because people didn't think that he would make it that far with being productive. Evidence of progress towards goal:  client reported 1 positive of being mindful to use communication skills to help resolve conflict within his relationship.   Suicidal/Homicidal:  Nowithout intent/plan  Therapist Response:  Therapist began the appointment asking the client how he has been doing since last seen. Therapist engaged with active listening and positive emotional support. Therapist used cbt to engage and ask about changes in his daily life. Therapist gave the client time to discuss his adjustment to his marriage, the change in his, and his overall family dynamic. Therapist used CBT ask the client to identify his progress with frequency of use with coping skills with continued practice in his daily activity.      Plan: Return again in 4 weeks.  Diagnosis: bipolar 1 mixed full remission  Collaboration of Care: Patient refused AEB none requested by the client.  Patient/Guardian was advised Release of Information must be obtained prior to any record release in order to collaborate their care with an outside provider. Patient/Guardian was advised if they have not already done so to contact the registration department to sign all necessary forms in order for us  to release information regarding their care.   Consent: Patient/Guardian gives verbal consent for treatment and assignment of benefits for services provided during this visit. Patient/Guardian expressed understanding and agreed to proceed.   Hermena Swint Y Elnora Quizon, LCSW 04/16/2024

## 2024-05-02 ENCOUNTER — Telehealth (HOSPITAL_COMMUNITY): Payer: Self-pay | Admitting: Physician Assistant

## 2024-05-05 NOTE — Telephone Encounter (Signed)
 Message acknowledged and reviewed. Provider was able to reach out to patient regarding his medication concerns.

## 2024-05-14 ENCOUNTER — Telehealth (INDEPENDENT_AMBULATORY_CARE_PROVIDER_SITE_OTHER): Admitting: Physician Assistant

## 2024-05-14 DIAGNOSIS — F419 Anxiety disorder, unspecified: Secondary | ICD-10-CM

## 2024-05-14 DIAGNOSIS — Z79899 Other long term (current) drug therapy: Secondary | ICD-10-CM | POA: Diagnosis not present

## 2024-05-14 DIAGNOSIS — F3178 Bipolar disorder, in full remission, most recent episode mixed: Secondary | ICD-10-CM | POA: Diagnosis not present

## 2024-05-16 MED ORDER — QUETIAPINE FUMARATE 200 MG PO TABS: 200.0000 mg | ORAL_TABLET | Freq: Every day | ORAL | 2 refills | Status: DC

## 2024-05-16 MED ORDER — BENZTROPINE MESYLATE 0.5 MG PO TABS: 0.5000 mg | ORAL_TABLET | Freq: Every day | ORAL | 2 refills | Status: DC

## 2024-05-16 MED ORDER — OXCARBAZEPINE 600 MG PO TABS: 600.0000 mg | ORAL_TABLET | Freq: Two times a day (BID) | ORAL | 2 refills | Status: DC

## 2024-05-16 MED ORDER — HYDROXYZINE HCL 25 MG PO TABS: 25.0000 mg | ORAL_TABLET | Freq: Three times a day (TID) | ORAL | 2 refills | Status: DC | PRN

## 2024-05-17 ENCOUNTER — Encounter (HOSPITAL_COMMUNITY): Payer: Self-pay | Admitting: Physician Assistant

## 2024-05-17 NOTE — Progress Notes (Signed)
 [facility] MD/PA/NP OP Progress Note  Virtual Visit via Video Note  I connected with George [name] on 05/14/24 at 10:00 AM EDT by a video enabled telemedicine application and verified that I am speaking with the correct person using two identifiers.  Location: Patient: Home Provider: Clinic   I discussed the limitations of evaluation and management by telemedicine and the availability of in person appointments. The patient expressed understanding and agreed to proceed.  Follow Up Instructions:  I discussed the assessment and treatment plan with the patient. The patient was provided an opportunity to ask questions and all were answered. The patient agreed with the plan and demonstrated an understanding of the instructions.   The patient was advised to call back or seek an in-person evaluation if the symptoms worsen or if the condition fails to improve as anticipated.  I provided 18 minutes of non-face-to-face time during this encounter.  George FORBES Bolster, PA    05/14/2024 10:00 AM George [name]  MRN:  [id number]  Chief Complaint:  No chief complaint on file.  HPI:   George [name] is a 33 year old, African-American male with a past psychiatric history significant for bipolar 1 disorder, anxiety, and cannabis use disorder who presents to [facility] via virtual video visit for follow-up and medication management.  Patient is currently being managed on the following psychiatric medications:  Gabapentin  300 mg 3 times daily Cogentin  0.5 mg at bedtime Seroquel  200 mg at bedtime Oxcarbazepine  600 mg 2 times daily Hydroxyzine  25 mg 2 times daily as needed  ***  Patient is alert and oriented x 4, calm, cooperative, and fully engaged in conversation during the encounter. ***  Visit Diagnosis:    ICD-10-CM   1. Long term current use of antipsychotic medication  Z79.899 Ambulatory referral to Cardiology    2. Bipolar 1 disorder, mixed, full  remission (HCC)  F31.78 benztropine  (COGENTIN ) 0.5 MG tablet    QUEtiapine  (SEROQUEL ) 200 MG tablet    oxcarbazepine  (TRILEPTAL ) 600 MG tablet    3. Anxiety  F41.9 hydrOXYzine  (ATARAX ) 25 MG tablet      Past Psychiatric History:  Bipolar 1 disorder   History of several emergency room visits as well as psychiatry hospitalizations in the past.  Patient was recently discharged from [facility] on 09/23/2022  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    Past Surgical History:  Procedure Laterality Date   NO PAST SURGERIES     ORIF ANKLE FRACTURE Right 01/31/2017   Procedure: OPEN REDUCTION INTERNAL FIXATION (ORIF) RIGHT TRIMALLEOLAR ANKLE FRACTURE;  Surgeon: Kay CHRISTELLA Cummins, MD;  Location: [facility] OR;  Service: Orthopedics;  Laterality: Right;    Family Psychiatric History:  No reported family history of psychiatric illness   Family History:  Family History  Problem Relation Age of Onset   Cancer Father    Diabetes Maternal Grandmother    Alzheimer's disease Paternal Grandmother    Heart failure Paternal Grandmother    Heart failure Paternal Grandfather    Heart failure Maternal Grandfather     Social History:  Social History   Socioeconomic History   Marital status: Single    Spouse name: Not on file   Number of children: Not on file   Years of  education: Not on file   Highest education level: Not on file  Occupational History   Not on file  Tobacco Use   Smoking status: Every Day    Current packs/day: 1.00    Average packs/day: 1 pack/day for 10.0 years (10.0 ttl pk-yrs)    Types: Cigarettes   Smokeless tobacco: Never   Tobacco comments:    off and on  Substance and Sexual Activity   Alcohol use: Yes   Drug use: Yes    Types: Marijuana, Cocaine    Comment: heroin   Sexual  activity: Not on file    Comment: Unknown  Other Topics Concern   Not on file  Social History Narrative   Not on file   Social Drivers of Health   Financial Resource Strain: Not on file  Food Insecurity: Not on file  Transportation Needs: Not on file  Physical Activity: Not on file  Stress: Not on file  Social Connections: Not on file    Allergies:  Allergies  Allergen Reactions   Amoxicillin Anaphylaxis, Itching, Swelling and Rash    throat started to close up Has patient had a PCN reaction causing immediate rash, facial/tongue/throat swelling, SOB or lightheadedness with hypotension: Yes Has patient had a PCN reaction causing severe rash involving mucus membranes or skin necrosis: Yes Has patient had a PCN reaction that required hospitalization No Has patient had a PCN reaction occurring within the last 10 years: No If all of the above answers are NO, then may proceed with Cephalosporin use.    Penicillins Anaphylaxis, Itching, Rash and Other (See Comments)    Has patient had a PCN reaction causing immediate rash, facial/tongue/throat swelling, SOB or lightheadedness with hypotension: unsure Has patient had a PCN reaction causing severe rash involving mucus membranes or skin necrosis: Unsure Has patient had a PCN reaction that required hospitalization Unsure Has patient had a PCN reaction occurring within the last 10 years: No If all of the above answers are NO, then may proceed with Cephalosporin use.    Metabolic Disorder Labs: Lab Results  Component Value Date   HGBA1C 6.2 (H) 12/10/2015   MPG 131 12/10/2015   Lab Results  Component Value Date   PROLACTIN 28.2 (H) 12/10/2015   Lab Results  Component Value Date   CHOL 119 12/10/2015   TRIG 91 12/10/2015   HDL 34 (L) 12/10/2015   CHOLHDL 3.5 12/10/2015   VLDL 18 12/10/2015   LDLCALC 67 12/10/2015   Lab Results  Component Value Date   TSH 1.744 12/10/2015    Therapeutic Level Labs: No results found  for: LITHIUM No results found for: VALPROATE No results found for: CBMZ  Current Medications: Current Outpatient Medications  Medication Sig Dispense Refill   benztropine  (COGENTIN ) 0.5 MG tablet Take 1 tablet (0.5 mg total) by mouth at bedtime. 30 tablet 2   gabapentin  (NEURONTIN ) 300 MG capsule Take 1 capsule (300 mg total) by mouth 3 (three) times daily. 90 capsule 2   GINSENG PO Take 1 tablet by mouth daily.     hydrOXYzine  (ATARAX ) 25 MG tablet Take 1 tablet (25 mg total) by mouth 3 (three) times daily as needed. 90 tablet 2   OVER THE COUNTER MEDICATION Take 1 tablet by mouth daily. Ginkgo Biloba     OVER THE COUNTER MEDICATION Take 2 tablets by mouth daily. L arginine PO     oxcarbazepine  (TRILEPTAL ) 600 MG tablet Take 1 tablet (600 mg total) by mouth 2 (two) times daily. 60  tablet 2   QUEtiapine  (SEROQUEL ) 200 MG tablet Take 1 tablet (200 mg total) by mouth at bedtime. 30 tablet 2   No current facility-administered medications for this visit.     Musculoskeletal: Strength & Muscle Tone: within normal limits Gait & Station: normal Patient leans: N/A  Psychiatric Specialty Exam: Review of Systems  Psychiatric/Behavioral:  Negative for decreased concentration, dysphoric mood, hallucinations, self-injury, sleep disturbance and suicidal ideas. The patient is not nervous/anxious and is not hyperactive.     There were no vitals taken for this visit.There is no height or weight on file to calculate BMI.  General Appearance: Casual  Eye Contact:  Good  Speech:  Clear and Coherent and Normal Rate  Volume:  Normal  Mood:  Euthymic  Affect:  Appropriate  Thought Process:  Coherent and Descriptions of Associations: Intact  Orientation:  Full (Time, Place, and Person)  Thought Content: WDL   Suicidal Thoughts:  No  Homicidal Thoughts:  No  Memory:  Immediate;   Good Recent;   Good Remote;   Good  Judgement:  Good  Insight:  Good  Psychomotor Activity:  Normal   Concentration:  Concentration: Good and Attention Span: Good  Recall:  Good  Fund of Knowledge: Good  Language: Good  Akathisia:  No  Handed:  Right  AIMS (if indicated): done; 0  Assets:  Communication Skills Desire for Improvement Housing Intimacy Physical Health Resilience Social Support Vocational/Educational  ADL's:  Intact  Cognition: WNL  Sleep:  Good   Screenings: AIMS    Flowsheet Row Video Visit from 05/14/2024 in [facility] Video Visit from 03/11/2024 in [facility]  AIMS Total Score 0 0 0   AUDIT    Flowsheet Row [facility]  Alcohol Use Disorder Identification Test Final Score (AUDIT) 0   GAD-7    Flowsheet Row Video Visit from 05/14/2024 in [facility] Video Visit from 03/11/2024 in [facility] Video Visit from 12/18/2023 in [facility] Video Visit from 10/23/2023 in [facility] Video Visit from 06/29/2023 in [facility]  Total GAD-7 Score 0 3 0 0 0   PHQ2-9    Flowsheet Row Video Visit from 05/14/2024 in [facility] Video Visit from 03/11/2024 in [facility] Video Visit from 12/18/2023 in [facility] Video Visit from 10/23/2023 in [facility] Video Visit from 08/31/2023 in [facility]  PHQ-2 Total Score 0 0 0 0 0   Flowsheet Row Video Visit from 05/14/2024 in [facility] Video Visit from 03/11/2024 in [facility] Video Visit from 12/18/2023 in [facility]  C-SSRS RISK CATEGORY Moderate Risk  Moderate Risk Moderate Risk     Assessment and Plan:   Armstrong Creasy is a 33 year old, African-American male with a past psychiatric history significant for bipolar 1 disorder, anxiety, and cannabis use disorder who presents to [facility] via virtual video visit for follow-up and medication management. ***  Collaboration of Care: Collaboration of Care: Medication Management AEB provider managing patient's psychiatric medications, Psychiatrist AEB patient being followed by mental health provider, and Referral or follow-up with counselor/therapist AEB patient being seen by a  licensed clinical Child psychotherapist at this facility  Patient/Guardian was advised Release of Information must be obtained prior to any record release in order to collaborate their care with an outside provider. Patient/Guardian was advised if they have not already done so to contact the registration department to sign all necessary forms in order for us  to release information regarding their care.   Consent: Patient/Guardian gives verbal consent for treatment and assignment of benefits for services provided during this visit. Patient/Guardian expressed understanding and agreed to proceed.   1. Bipolar 1 disorder, mixed, full remission (HCC)  - benztropine  (COGENTIN ) 0.5 MG tablet; Take 1 tablet (0.5 mg total) by mouth at bedtime.  Dispense: 30 tablet; Refill: 2 - QUEtiapine  (SEROQUEL ) 200 MG tablet; Take 1 tablet (200 mg total) by mouth at bedtime.  Dispense: 30 tablet; Refill: 2 - oxcarbazepine  (TRILEPTAL ) 600 MG tablet; Take 1 tablet (600 mg total) by mouth 2 (two) times daily.  Dispense: 60 tablet; Refill: 2  2. Anxiety  - hydrOXYzine  (ATARAX ) 25 MG tablet; Take 1 tablet (25 mg total) by mouth 3 (three) times daily as needed.  Dispense: 90 tablet; Refill: 2  3. Long term current use of antipsychotic medication (Primary) Pending labs  - Ambulatory referral to  Cardiology  Patient to follow-up in 2 months Provider spent a total of 18 minutes with the patient/reviewing patient's chart  George FORBES Bolster, PA 05/14/2024, 10:00 AM

## 2024-05-21 ENCOUNTER — Ambulatory Visit (INDEPENDENT_AMBULATORY_CARE_PROVIDER_SITE_OTHER): Admitting: Clinical

## 2024-05-21 DIAGNOSIS — F3178 Bipolar disorder, in full remission, most recent episode mixed: Secondary | ICD-10-CM | POA: Diagnosis not present

## 2024-05-21 NOTE — Progress Notes (Signed)
   THERAPIST PROGRESS NOTE  Session Time: 60 min  Participation Level: Active  Behavioral Response: CasualAlertEuthymic  Type of Therapy: Individual Therapy  Treatment Goals addressed:  George [name] WILL IDENTIFY COGNITIVE PATTERNS AND BELIEFS THAT INTERFERE WITH THERAPY ONCE PER SESSION   ProgressTowards Goals: Progressing  Interventions: CBT and Supportive  Summary:  George [name] is a 33 y.o. male who presents for the scheduled appointment oriented times five, appropriately dressed and friendly. Client denied hallucinations and delusions. Client reported he has been doing fairly well. Client reported things have been going pretty well.  Client reported work is going well.  Client reported he and his wife recently had their third session with the marriage counselor via a telehealth platform.  Client reported they were feeling a little bit iffy about whether the therapist is a right match but unless his wife says she wants to find someone new they will stay with who they are with. Client reported there are some things that still needs to be worked on between the both of them such as communication about their feelings and accountability for how their mood affects 1 another. Client reported overall things are being managed okay. Client reported his wife is seeing her on individual therapist. Client reported he did get the idea from his wife that he may have some interest in working through a workbook to also help him. Client reported he will look into that and discuss it at his next therapy session.  Evidence of progress towards goal:  client reported 1 positive of being able to communicate and regular his emotions better than he has by history.  Suicidal/Homicidal: Nowithout intent/plan  Therapist Response:  Therapist began the appointment asking the client how he has been doing Therapist engaged using active listening and positive emotional support. Therapist used cbt to engage give him  time to discuss family and personal perspective on progress being made as well as some challenges to work through. Therapist used cbt to teach the client how to reframe communication in his marriage to help with positive redirection. Therapist used CBT ask the client to identify his progress with frequency of use with coping skills with continued practice in his daily activity.     Plan: Return again in 4 weeks.  Diagnosis: bipolar 1 ,mixed, full remission  Collaboration of Care: Patient refused AEB none requested by the client.  Patient/Guardian was advised Release of Information must be obtained prior to any record release in order to collaborate their care with an outside provider. Patient/Guardian was advised if they have not already done so to contact the registration department to sign all necessary forms in order for us  to release information regarding their care.   Consent: Patient/Guardian gives verbal consent for treatment and assignment of benefits for services provided during this visit. Patient/Guardian expressed understanding and agreed to proceed.   George Kassing Y Berkley Wrightsman, LCSW 05/21/2024

## 2024-06-11 ENCOUNTER — Ambulatory Visit (HOSPITAL_COMMUNITY): Admitting: Clinical

## 2024-07-02 ENCOUNTER — Ambulatory Visit (HOSPITAL_COMMUNITY): Admitting: Clinical

## 2024-07-16 ENCOUNTER — Encounter (HOSPITAL_COMMUNITY): Payer: Self-pay | Admitting: Physician Assistant

## 2024-07-16 ENCOUNTER — Telehealth (HOSPITAL_COMMUNITY): Admitting: Physician Assistant

## 2024-07-16 DIAGNOSIS — Z79899 Other long term (current) drug therapy: Secondary | ICD-10-CM

## 2024-07-16 DIAGNOSIS — F419 Anxiety disorder, unspecified: Secondary | ICD-10-CM | POA: Diagnosis not present

## 2024-07-16 DIAGNOSIS — F3178 Bipolar disorder, in full remission, most recent episode mixed: Secondary | ICD-10-CM | POA: Diagnosis not present

## 2024-07-16 MED ORDER — BENZTROPINE MESYLATE 0.5 MG PO TABS: 0.5000 mg | ORAL_TABLET | Freq: Every day | ORAL | 2 refills | Status: DC

## 2024-07-16 MED ORDER — OXCARBAZEPINE 600 MG PO TABS: 600.0000 mg | ORAL_TABLET | Freq: Two times a day (BID) | ORAL | 2 refills | Status: DC

## 2024-07-16 MED ORDER — GABAPENTIN 300 MG PO CAPS: 300.0000 mg | ORAL_CAPSULE | Freq: Three times a day (TID) | ORAL | 2 refills | Status: DC

## 2024-07-16 MED ORDER — QUETIAPINE FUMARATE 200 MG PO TABS: 200.0000 mg | ORAL_TABLET | Freq: Every day | ORAL | 2 refills | Status: DC

## 2024-07-16 MED ORDER — HYDROXYZINE HCL 25 MG PO TABS: 25.0000 mg | ORAL_TABLET | Freq: Three times a day (TID) | ORAL | 2 refills | Status: DC | PRN

## 2024-07-16 NOTE — Progress Notes (Signed)
 [facility] MD/PA/NP OP Progress Note  Virtual Visit via Video Note  I connected with George [name] on 07/16/24 at 10:00 AM EDT by a video enabled telemedicine application and verified that I am speaking with the correct person using two identifiers.  Location: Patient: Home Provider: Clinic   I discussed the limitations of evaluation and management by telemedicine and the availability of in person appointments. The patient expressed understanding and agreed to proceed.  Follow Up Instructions:  I discussed the assessment and treatment plan with the patient. The patient was provided an opportunity to ask questions and all were answered. The patient agreed with the plan and demonstrated an understanding of the instructions.   The patient was advised to call back or seek an in-person evaluation if the symptoms worsen or if the condition fails to improve as anticipated.  I provided 13 minutes of non-face-to-face time during this encounter.  Reginia FORBES Bolster, PA    07/16/2024 10:00 AM George [name]  MRN:  [id number]  Chief Complaint:  Chief Complaint  Patient presents with   Follow-up   Medication Refill   HPI:   George [name] is a 33 year old, African-American male with a past psychiatric history significant for bipolar 1 disorder, anxiety, and cannabis use disorder who presents to [facility] via virtual video visit for follow-up and medication management.  Patient is currently being managed on the following psychiatric medications:  Gabapentin  300 mg 3 times daily Cogentin  0.5 mg at bedtime Seroquel  200 mg at bedtime Oxcarbazepine  600 mg 2 times daily Hydroxyzine  25 mg 2 times daily as needed  Patient presents to the encounter stating that he has been taking his medications regularly and denies experiencing any adverse side effects.  Patient reports that he has been experiencing generalized stressors stating that his aunt has been sick,  his children are starting school, and they are about to move.  Patient reports that he has a new therapist and has been going to a support group for his mental health.  Patient reports that he has been trying to eat better and watch his calorie intake as well.  Patient denies overt depressive symptoms nor does he endorse anxiety.  Patient denies any new stressors at this time.  A PHQ-9 screen was performed with the patient scoring a 0.  A GAD-7 screen was also performed with the patient scoring an 18.  Patient is alert and oriented x 4, calm, cooperative, and fully engaged in conversation during the encounter.  Patient endorses good mood.  Patient exhibits euthymic mood with appropriate affect.  Patient denies suicidal or homicidal ideations.  He further denies auditory or visual hallucinations and does not appear to be responding to internal/external stimuli.  Patient endorses good sleep and receives on average 7 to 8 hours of sleep per night.  Patient endorses good appetite and eats on average 2-3 meals per day.  Patient denies alcohol consumption.  Patient denies tobacco use but does engage in vaping.  Patient endorses illicit drug use in the form of marijuana.  Visit Diagnosis:    ICD-10-CM   1. Long term current use of antipsychotic medication  Z79.899     2. Bipolar 1 disorder, mixed, full remission (HCC)  F31.78 benztropine  (COGENTIN ) 0.5 MG tablet    QUEtiapine  (SEROQUEL ) 200 MG tablet    oxcarbazepine  (TRILEPTAL ) 600 MG tablet    3. Anxiety  F41.9 hydrOXYzine  (ATARAX ) 25 MG tablet    gabapentin  (NEURONTIN ) 300 MG capsule  Past Psychiatric History:  Bipolar 1 disorder   History of several emergency room visits as well as psychiatry hospitalizations in the past.  Patient was recently discharged from [facility] on 09/23/2022  Past Medical History:  Past Medical History:  Diagnosis Date   ADHD (attention deficit hyperactivity disorder)    Ankle fracture 01/2017   RIGHT ANKLE    Anxiety    Bipolar disorder (HCC)    Depression    Sleep disorder 04/30/2014    Past Surgical History:  Procedure Laterality Date   NO PAST SURGERIES     ORIF ANKLE FRACTURE Right 01/31/2017   Procedure: OPEN REDUCTION INTERNAL FIXATION (ORIF) RIGHT TRIMALLEOLAR ANKLE FRACTURE;  Surgeon: Kay CHRISTELLA Cummins, MD;  Location: [facility] OR;  Service: Orthopedics;  Laterality: Right;    Family Psychiatric History:  No reported family history of psychiatric illness   Family History:  Family History  Problem Relation Age of Onset   Cancer Father    Diabetes Maternal Grandmother    Alzheimer's disease Paternal Grandmother    Heart failure Paternal Grandmother    Heart failure Paternal Grandfather    Heart failure Maternal Grandfather     Social History:  Social History   Socioeconomic History   Marital status: Single    Spouse name: Not on file   Number of children: Not on file   Years of education: Not on file   Highest education level: Not on file  Occupational History   Not on file  Tobacco Use   Smoking status: Every Day    Current packs/day: 1.00    Average packs/day: 1 pack/day for 10.0 years (10.0 ttl pk-yrs)    Types: Cigarettes   Smokeless tobacco: Never   Tobacco comments:    off and on  Substance and Sexual Activity   Alcohol use: Yes   Drug use: Yes    Types: Marijuana, Cocaine    Comment: heroin   Sexual activity: Not on file    Comment: Unknown  Other Topics Concern   Not on file  Social History Narrative   Not on file   Social Drivers of Health   Financial Resource Strain: Not on file  Food Insecurity: Not on file  Transportation Needs: Not on file  Physical Activity: Not on file  Stress: Not on file  Social Connections: Not on file    Allergies:  Allergies  Allergen Reactions   Amoxicillin Anaphylaxis, Itching, Swelling and Rash    throat started to close up Has patient had a PCN reaction causing immediate rash, facial/tongue/throat swelling, SOB or  lightheadedness with hypotension: Yes Has patient had a PCN reaction causing severe rash involving mucus membranes or skin necrosis: Yes Has patient had a PCN reaction that required hospitalization No Has patient had a PCN reaction occurring within the last 10 years: No If all of the above answers are NO, then may proceed with Cephalosporin use.    Penicillins Anaphylaxis, Itching, Rash and Other (See Comments)    Has patient had a PCN reaction causing immediate rash, facial/tongue/throat swelling, SOB or lightheadedness with hypotension: unsure Has patient had a PCN reaction causing severe rash involving mucus membranes or skin necrosis: Unsure Has patient had a PCN reaction that required hospitalization Unsure Has patient had a PCN reaction occurring within the last 10 years: No If all of the above answers are NO, then may proceed with Cephalosporin use.    Metabolic Disorder Labs: Lab Results  Component Value Date   HGBA1C 6.2 (H)  12/10/2015   MPG 131 12/10/2015   Lab Results  Component Value Date   PROLACTIN 28.2 (H) 12/10/2015   Lab Results  Component Value Date   CHOL 119 12/10/2015   TRIG 91 12/10/2015   HDL 34 (L) 12/10/2015   CHOLHDL 3.5 12/10/2015   VLDL 18 12/10/2015   LDLCALC 67 12/10/2015   Lab Results  Component Value Date   TSH 1.744 12/10/2015    Therapeutic Level Labs: No results found for: LITHIUM No results found for: VALPROATE No results found for: CBMZ  Current Medications: Current Outpatient Medications  Medication Sig Dispense Refill   benztropine  (COGENTIN ) 0.5 MG tablet Take 1 tablet (0.5 mg total) by mouth at bedtime. 30 tablet 2   gabapentin  (NEURONTIN ) 300 MG capsule Take 1 capsule (300 mg total) by mouth 3 (three) times daily. 90 capsule 2   GINSENG PO Take 1 tablet by mouth daily.     hydrOXYzine  (ATARAX ) 25 MG tablet Take 1 tablet (25 mg total) by mouth 3 (three) times daily as needed. 90 tablet 2   OVER THE COUNTER MEDICATION  Take 1 tablet by mouth daily. Ginkgo Biloba     OVER THE COUNTER MEDICATION Take 2 tablets by mouth daily. L arginine PO     oxcarbazepine  (TRILEPTAL ) 600 MG tablet Take 1 tablet (600 mg total) by mouth 2 (two) times daily. 60 tablet 2   QUEtiapine  (SEROQUEL ) 200 MG tablet Take 1 tablet (200 mg total) by mouth at bedtime. 30 tablet 2   No current facility-administered medications for this visit.     Musculoskeletal: Strength & Muscle Tone: within normal limits Gait & Station: normal Patient leans: N/A  Psychiatric Specialty Exam: Review of Systems  Psychiatric/Behavioral:  Negative for decreased concentration, dysphoric mood, hallucinations, self-injury, sleep disturbance and suicidal ideas. The patient is not nervous/anxious and is not hyperactive.     There were no vitals taken for this visit.There is no height or weight on file to calculate BMI.  General Appearance: Casual  Eye Contact:  Good  Speech:  Clear and Coherent and Normal Rate  Volume:  Normal  Mood:  Euthymic  Affect:  Appropriate  Thought Process:  Coherent and Descriptions of Associations: Intact  Orientation:  Full (Time, Place, and Person)  Thought Content: WDL   Suicidal Thoughts:  No  Homicidal Thoughts:  No  Memory:  Immediate;   Good Recent;   Good Remote;   Good  Judgement:  Good  Insight:  Good  Psychomotor Activity:  Normal  Concentration:  Concentration: Good and Attention Span: Good  Recall:  Good  Fund of Knowledge: Good  Language: Good  Akathisia:  No  Handed:  Right  AIMS (if indicated): done; 0  Assets:  Communication Skills Desire for Improvement Housing Intimacy Physical Health Resilience Social Support Vocational/Educational  ADL's:  Intact  Cognition: WNL  Sleep:  Good   Screenings: AIMS    Flowsheet Row Video Visit from 07/16/2024 in [facility] Video Visit from 05/14/2024 in [facility] Video Visit from 03/11/2024 in  [facility]  AIMS Total Score 0 0 0 0   AUDIT    Flowsheet Row [facility]  Alcohol Use Disorder Identification Test Final Score (AUDIT) 0   GAD-7    Flowsheet Row Video Visit from 07/16/2024 in [facility] Video  Visit from 05/14/2024 in [facility] Video Visit from 03/11/2024 in [facility] Video Visit from 12/18/2023 in [facility] Video Visit from 10/23/2023 in [facility]  Total GAD-7 Score 2 0 3 0 0   PHQ2-9    Flowsheet Row Video Visit from 07/16/2024 in [facility] Video Visit from 05/14/2024 in [facility] Video Visit from 03/11/2024 in [facility] Video Visit from 12/18/2023 in [facility] Video Visit from 10/23/2023 in [facility]  PHQ-2 Total Score 0 0 0 0 0   Flowsheet Row Video Visit from 07/16/2024 in [facility] Video Visit from 05/14/2024 in [facility] Video Visit from 03/11/2024 in [facility]  C-SSRS RISK CATEGORY Moderate Risk Moderate Risk Moderate Risk     Assessment and Plan:   George [name] is a 33 year old, African-American male with a past psychiatric history significant for bipolar 1 disorder, anxiety, and cannabis use disorder who presents to [facility] via virtual video visit for follow-up and medication management.  Patient presents to the encounter stating that he continues to take his medications regularly and denies experiencing any adverse side effects at this time.  An aims  assessment was performed with the patient scoring a 0.  Patient denies overt depressive symptoms nor does he endorse anxiety.  He does continue to endorse general stressors of life but states that he has been managing.  A PHQ-9 screen was performed the patient scoring a 0.  A GAD-7 screen was also performed with the patient scoring a 2.  Patient endorses stability on his current medication regimen and would like to continue taking his medications as prescribed.  A [place] Suicide Severity Rating Scale was performed with the patient being considered moderate risk.  Patient denies suicidal ideations and is able to contract for safety following the conclusion of the encounter.  Safety planning was discussed with the patient prior to the conclusion of the encounter: - Patient was instructed to contact 911 in the event of a mental health crisis - Patient was instructed to contact [address] [facility] in the event of a mental health crisis - Patient was instructed to present to [facility] in the event of a mental health crisis  Collaboration of Care: Collaboration of Care: Medication Management AEB provider managing patient's psychiatric medications, Psychiatrist AEB patient being followed by mental health provider, and Referral or follow-up with counselor/therapist AEB patient being seen by a licensed clinical social worker at this facility  Patient/Guardian was advised Release of Information must be obtained prior to any record release in order to collaborate their care with an outside provider. Patient/Guardian was advised if they have not already done so to contact the registration department to sign all necessary forms in order for us  to release information regarding their care.   Consent: Patient/Guardian gives verbal consent for treatment and assignment of benefits for services provided during this visit. Patient/Guardian expressed understanding and agreed  to proceed.   1. Long term current use of antipsychotic medication (Primary) Pending labs Referral to cardiology for EKG  2. Bipolar 1 disorder, mixed, full remission (HCC)  - benztropine  (COGENTIN ) 0.5 MG tablet; Take 1 tablet (0.5 mg total) by mouth at bedtime.  Dispense: 30 tablet; Refill: 2 - QUEtiapine  (SEROQUEL )  200 MG tablet; Take 1 tablet (200 mg total) by mouth at bedtime.  Dispense: 30 tablet; Refill: 2 - oxcarbazepine  (TRILEPTAL ) 600 MG tablet; Take 1 tablet (600 mg total) by mouth 2 (two) times daily.  Dispense: 60 tablet; Refill: 2  3. Anxiety  - hydrOXYzine  (ATARAX ) 25 MG tablet; Take 1 tablet (25 mg total) by mouth 3 (three) times daily as needed.  Dispense: 90 tablet; Refill: 2 - gabapentin  (NEURONTIN ) 300 MG capsule; Take 1 capsule (300 mg total) by mouth 3 (three) times daily.  Dispense: 90 capsule; Refill: 2  Patient to follow-up in 2 months Provider spent a total of 13 minutes with the patient/reviewing patient's chart  Reginia FORBES Bolster, PA 07/16/2024, 10:00 AM

## 2024-07-28 ENCOUNTER — Telehealth (HOSPITAL_COMMUNITY): Payer: Self-pay

## 2024-07-28 NOTE — Telephone Encounter (Signed)
 Patient called in requesting a call from provider. Per patient he recently dropped off forms to be completed for his job. However; per patient his job is trying to have him stepdown from his position before his documents can be completed.

## 2024-10-15 ENCOUNTER — Telehealth (HOSPITAL_COMMUNITY): Admitting: Physician Assistant

## 2024-10-15 DIAGNOSIS — Z79899 Other long term (current) drug therapy: Secondary | ICD-10-CM

## 2024-10-15 DIAGNOSIS — F3178 Bipolar disorder, in full remission, most recent episode mixed: Secondary | ICD-10-CM

## 2024-10-15 DIAGNOSIS — F419 Anxiety disorder, unspecified: Secondary | ICD-10-CM

## 2024-10-19 ENCOUNTER — Encounter (HOSPITAL_COMMUNITY): Payer: Self-pay | Admitting: Physician Assistant

## 2024-10-19 MED ORDER — HYDROXYZINE HCL 25 MG PO TABS: 25.0000 mg | ORAL_TABLET | Freq: Three times a day (TID) | ORAL | 2 refills | Status: AC | PRN

## 2024-10-19 MED ORDER — GABAPENTIN 300 MG PO CAPS: 300.0000 mg | ORAL_CAPSULE | Freq: Three times a day (TID) | ORAL | 2 refills | Status: AC

## 2024-10-19 MED ORDER — QUETIAPINE FUMARATE 200 MG PO TABS: 200.0000 mg | ORAL_TABLET | Freq: Every day | ORAL | 2 refills | Status: AC

## 2024-10-19 MED ORDER — OXCARBAZEPINE 600 MG PO TABS: 600.0000 mg | ORAL_TABLET | Freq: Two times a day (BID) | ORAL | 2 refills | Status: DC

## 2024-10-19 MED ORDER — BENZTROPINE MESYLATE 0.5 MG PO TABS: 0.5000 mg | ORAL_TABLET | Freq: Every day | ORAL | 2 refills | Status: AC

## 2024-10-19 NOTE — Progress Notes (Signed)
 [facility] MD/PA/NP OP Progress Note  Virtual Visit via Video Note  I connected with George [name] on 10/15/24 at  5:30 PM EST by a video enabled telemedicine application and verified that I am speaking with the correct person using two identifiers.  Location: Patient: Home Provider: Clinic   I discussed the limitations of evaluation and management by telemedicine and the availability of in person appointments. The patient expressed understanding and agreed to proceed.  Follow Up Instructions:  I discussed the assessment and treatment plan with the patient. The patient was provided an opportunity to ask questions and all were answered. The patient agreed with the plan and demonstrated an understanding of the instructions.   The patient was advised to call back or seek an in-person evaluation if the symptoms worsen or if the condition fails to improve as anticipated.  I provided 22 minutes of non-face-to-face time during this encounter.  George FORBES Bolster, PA    10/15/2024 5:30 PM George [name]  MRN:  [id number]  Chief Complaint:  Chief Complaint  Patient presents with   Medication Refill   Follow-up   HPI:   George [name] is a 33 year old, African-American male with a past psychiatric history significant for bipolar 1 disorder, anxiety, and cannabis use disorder who presents to [facility] via virtual video visit for follow-up and medication management.  Patient is currently being managed on the following psychiatric medications:  Gabapentin  300 mg 3 times daily Cogentin  0.5 mg at bedtime Seroquel  200 mg at bedtime Oxcarbazepine  600 mg 2 times daily Hydroxyzine  25 mg 2 times daily as needed  Patient presents to the encounter stating that he has been experiencing less activity during the holiday season.  Patient attributes this decrease in activity due to the holiday blues.  Patient reports that he has noticed that it takes some  increased effort to do things required of him.  Despite his decrease in activity, patient reports that his medication continues to be effective in managing his symptoms.  He reports that he will occasionally take an additional hydroxyzine  for managing his mood.  Patient endorses depression and rates his depression a 3 out of 10 with 10 being most severe.  He reports that he would normally rate his depression a 0 out of 10.  He reports that he has not been experiencing depression this week but states that the weeks leading up to Thanksgiving were stressful with episodes of depression present.  Patient denies any current depressive symptoms.  Patient endorses minimal anxiety and rates his anxiety a 1 out of 10.  He reports that his anxiety peaked during Thanksgiving.  Patient denies any new stressors at this time but states that he recently had to step down from his previous position at his place of employ.  A PHQ-9 screen was performed the patient scoring a 3.  A GAD-7 screen was also performed with the patient scoring a 0.  Patient is alert and oriented x 4, calm, cooperative, and fully engaged in conversation during the encounter.  Patient endorses good mood.  Patient exhibits euthymic mood with appropriate affect.  Patient denies suicidal or homicidal ideations.  He further denies auditory or visual hallucinations and does not appear to be responding to internal/external stimuli.  Patient endorses good sleep and receives on average 8 hours of sleep per night.  Patient endorses fair appetite and eats on average 3 meals per day.  Patient denies alcohol consumption.  Patient denies tobacco use but  does engage in vaping.  Patient denies illicit drug use but states that he does use THC-A.  Visit Diagnosis:    ICD-10-CM   1. Long term current use of antipsychotic medication  Z79.899     2. Bipolar 1 disorder, mixed, full remission  F31.78 QUEtiapine  (SEROQUEL ) 200 MG tablet    oxcarbazepine  (TRILEPTAL ) 600 MG  tablet    benztropine  (COGENTIN ) 0.5 MG tablet    3. Anxiety  F41.9 hydrOXYzine  (ATARAX ) 25 MG tablet    gabapentin  (NEURONTIN ) 300 MG capsule      Past Psychiatric History:  Bipolar 1 disorder   History of several emergency room visits as well as psychiatry hospitalizations in the past.  Patient was recently discharged from [facility] on 09/23/2022  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    Past Surgical History:  Procedure Laterality Date   NO PAST SURGERIES     ORIF ANKLE FRACTURE Right 01/31/2017   Procedure: OPEN REDUCTION INTERNAL FIXATION (ORIF) RIGHT TRIMALLEOLAR ANKLE FRACTURE;  Surgeon: Kay CHRISTELLA Cummins, MD;  Location: [facility] OR;  Service: Orthopedics;  Laterality: Right;    Family Psychiatric History:  No reported family history of psychiatric illness   Family History:  Family History  Problem Relation Age of Onset   Cancer Father    Diabetes Maternal Grandmother    Alzheimer's disease Paternal Grandmother    Heart failure Paternal Grandmother    Heart failure Paternal Grandfather    Heart failure Maternal Grandfather     Social History:  Social History   Socioeconomic History   Marital status: Single    Spouse name: Not on file   Number of children: Not on file   Years of education: Not on file   Highest education level: Not on file  Occupational History   Not on file  Tobacco Use   Smoking status: Every Day    Current packs/day: 1.00    Average packs/day: 1 pack/day for 10.0 years (10.0 ttl pk-yrs)    Types: Cigarettes   Smokeless tobacco: Never   Tobacco comments:    off and on  Substance and Sexual Activity   Alcohol use: Yes   Drug use: Yes    Types: Marijuana, Cocaine    Comment: heroin   Sexual activity: Not on file    Comment: Unknown  Other Topics  Concern   Not on file  Social History Narrative   Not on file   Social Drivers of Health   Tobacco Use: High Risk (10/19/2024)   Patient History    Smoking Tobacco Use: Every Day    Smokeless Tobacco Use: Never    Passive Exposure: Not on file  Financial Resource Strain: Not on file  Food Insecurity: Not on file  Transportation Needs: Not on file  Physical Activity: Not on file  Stress: Not on file  Social Connections: Not on file  Depression (EYV7-0): Low Risk (10/15/2024)   Depression (PHQ2-9)    PHQ-2 Score: 3  Alcohol Screen: Not on file  Housing: Not on file  Utilities: Not on file  Health Literacy: Not on file    Allergies:  Allergies  Allergen Reactions   Amoxicillin Anaphylaxis, Itching, Swelling and Rash    throat started to close up Has patient had a PCN  reaction causing immediate rash, facial/tongue/throat swelling, SOB or lightheadedness with hypotension: Yes Has patient had a PCN reaction causing severe rash involving mucus membranes or skin necrosis: Yes Has patient had a PCN reaction that required hospitalization No Has patient had a PCN reaction occurring within the last 10 years: No If all of the above answers are NO, then may proceed with Cephalosporin use.    Penicillins Anaphylaxis, Itching, Rash and Other (See Comments)    Has patient had a PCN reaction causing immediate rash, facial/tongue/throat swelling, SOB or lightheadedness with hypotension: unsure Has patient had a PCN reaction causing severe rash involving mucus membranes or skin necrosis: Unsure Has patient had a PCN reaction that required hospitalization Unsure Has patient had a PCN reaction occurring within the last 10 years: No If all of the above answers are NO, then may proceed with Cephalosporin use.    Metabolic Disorder Labs: Lab Results  Component Value Date   HGBA1C 6.2 (H) 12/10/2015   MPG 131 12/10/2015   Lab Results  Component Value Date   PROLACTIN 28.2 (H)  12/10/2015   Lab Results  Component Value Date   CHOL 119 12/10/2015   TRIG 91 12/10/2015   HDL 34 (L) 12/10/2015   CHOLHDL 3.5 12/10/2015   VLDL 18 12/10/2015   LDLCALC 67 12/10/2015   Lab Results  Component Value Date   TSH 1.744 12/10/2015    Therapeutic Level Labs: No results found for: LITHIUM No results found for: VALPROATE No results found for: CBMZ  Current Medications: Current Outpatient Medications  Medication Sig Dispense Refill   benztropine  (COGENTIN ) 0.5 MG tablet Take 1 tablet (0.5 mg total) by mouth at bedtime. 30 tablet 2   gabapentin  (NEURONTIN ) 300 MG capsule Take 1 capsule (300 mg total) by mouth 3 (three) times daily. 90 capsule 2   GINSENG PO Take 1 tablet by mouth daily.     hydrOXYzine  (ATARAX ) 25 MG tablet Take 1 tablet (25 mg total) by mouth 3 (three) times daily as needed. 90 tablet 2   OVER THE COUNTER MEDICATION Take 1 tablet by mouth daily. Ginkgo Biloba     OVER THE COUNTER MEDICATION Take 2 tablets by mouth daily. L arginine PO     oxcarbazepine  (TRILEPTAL ) 600 MG tablet Take 1 tablet (600 mg total) by mouth 2 (two) times daily. 60 tablet 2   QUEtiapine  (SEROQUEL ) 200 MG tablet Take 1 tablet (200 mg total) by mouth at bedtime. 30 tablet 2   No current facility-administered medications for this visit.     Musculoskeletal: Strength & Muscle Tone: within normal limits Gait & Station: normal Patient leans: N/A  Psychiatric Specialty Exam: Review of Systems  Psychiatric/Behavioral:  Negative for decreased concentration, dysphoric mood, hallucinations, self-injury, sleep disturbance and suicidal ideas. The patient is not nervous/anxious and is not hyperactive.     There were no vitals taken for this visit.There is no height or weight on file to calculate BMI.  General Appearance: Casual  Eye Contact:  Good  Speech:  Clear and Coherent and Normal Rate  Volume:  Normal  Mood:  Euthymic  Affect:  Appropriate  Thought Process:  Coherent  and Descriptions of Associations: Intact  Orientation:  Full (Time, Place, and Person)  Thought Content: WDL   Suicidal Thoughts:  No  Homicidal Thoughts:  No  Memory:  Immediate;   Good Recent;   Good Remote;   Good  Judgement:  Good  Insight:  Good  Psychomotor Activity:  Normal  Concentration:  Concentration: Good and Attention Span: Good  Recall:  Good  Fund of Knowledge: Good  Language: Good  Akathisia:  No  Handed:  Right  AIMS (if indicated): done; 0  Assets:  Communication Skills Desire for Improvement Housing Intimacy Physical Health Resilience Social Support Vocational/Educational  ADL's:  Intact  Cognition: WNL  Sleep:  Good   Screenings: AIMS    Flowsheet Row Video Visit from 10/15/2024 in [facility] Video Visit from 07/16/2024 in [facility] Video Visit from 05/14/2024 in [facility] Video Visit from 03[facility]11/2024 in [facility]  AIMS Total Score 0 0 0 0 0   AUDIT    Flowsheet Row [facility]  Alcohol Use Disorder Identification Test Final Score (AUDIT) 0   GAD-7    Flowsheet Row Video Visit from 10/15/2024 in [facility] Video Visit from 07/16/2024 in [facility] Video Visit from 05/14/2024 in [facility] Video Visit from 03/11/2024 in [facility] Video Visit from 12/18/2023 in [facility]  Total GAD-7 Score 0 2 0 3 0   PHQ2-9    Flowsheet Row Video Visit from 10/15/2024 in [facility] Video Visit from 07/16/2024 in [facility] Video Visit from 05/14/2024 in [facility]  Video Visit from 03/11/2024 in [facility] Video Visit from 12/18/2023 in [facility]  PHQ-2 Total Score 2 0 0 0 0  PHQ-9 Total Score 3 -- -- -- --   Flowsheet Row Video Visit from 10/15/2024 in [facility] Video Visit from 07/16/2024 in [facility] Video Visit from 05/14/2024 in [facility]  C-SSRS RISK CATEGORY Moderate Risk Moderate Risk Moderate Risk     Assessment and Plan:   Ardian Haberland is a 33 year old, African-American male with a past psychiatric history significant for bipolar 1 disorder, anxiety, and cannabis use disorder who presents to [facility] via virtual video visit for follow-up and medication management.  Patient presents to the encounter stating that he continues to take his medication regularly and denies experiencing any adverse side effects.  An aims assessment was performed with the patient scoring a 0.  Patient reports that he has been experiencing changes in mood due to the holiday blues. Patient also endorses changes in his mood due to recently stepping down from a potential promotion at his job.  Despite these stressors, patient reports that his depression has been mostly manageable.  Patient denies anxiety but states that he had elevated anxiety shortly after stepping down from his previous position at work.  A PHQ-9 screen was performed with the patient scoring a 3.  A GAD-7 screen was also performed with the patient scoring a 0.  Patient endorses stability with his current medication regimen and would like to continue taking his medications as prescribed.  Patient's medications to be e-prescribed to pharmacy of choice.  Provider reminded patient that he has pending labs due to his use of Seroquel .  Patient verbalized understanding.  Patient's last EKG was performed on 09/15/2022.   Patient's QTc at the time was 397 ms.  A [place] Suicide Severity Rating Scale was performed with the  patient being considered moderate risk.  Patient denies suicidal ideations and is able to contract for safety following the conclusion of the encounter.  Safety planning was discussed with the patient prior to the conclusion of the encounter: - Patient was instructed to contact 911 in the event of a mental health crisis - Patient was instructed to contact [facility] [employer] [facility] in the event of a mental health crisis - Patient was instructed to present to [facility] in the event of a mental health crisis  Collaboration of Care: Collaboration of Care: Medication Management AEB provider managing patient's psychiatric medications, Psychiatrist AEB patient being followed by mental health provider, and Referral or follow-up with counselor/therapist AEB patient being seen by a licensed clinical social worker at this facility  Patient/Guardian was advised Release of Information must be obtained prior to any record release in order to collaborate their care with an outside provider. Patient/Guardian was advised if they have not already done so to contact the registration department to sign all necessary forms in order for us  to release information regarding their care.   Consent: Patient/Guardian gives verbal consent for treatment and assignment of benefits for services provided during this visit. Patient/Guardian expressed understanding and agreed to proceed.   1. Bipolar 1 disorder, mixed, full remission  - QUEtiapine  (SEROQUEL ) 200 MG tablet; Take 1 tablet (200 mg total) by mouth at bedtime.  Dispense: 30 tablet; Refill: 2 - oxcarbazepine  (TRILEPTAL ) 600 MG tablet; Take 1 tablet (600 mg total) by mouth 2 (two) times daily.  Dispense: 60 tablet; Refill: 2 - benztropine  (COGENTIN ) 0.5 MG tablet; Take 1 tablet (0.5 mg total) by mouth at bedtime.  Dispense: 30  tablet; Refill: 2  2. Anxiety  - hydrOXYzine  (ATARAX ) 25 MG tablet; Take 1 tablet (25 mg total) by mouth 3 (three) times daily as needed.  Dispense: 90 tablet; Refill: 2 - gabapentin  (NEURONTIN ) 300 MG capsule; Take 1 capsule (300 mg total) by mouth 3 (three) times daily.  Dispense: 90 capsule; Refill: 2  3. Long term current use of antipsychotic medication (Primary) Pending labs  patient to be scheduled for a future EKG appointment  Patient to follow-up in 2 months Provider spent a total of 22 minutes with the patient/reviewing patient's chart  George FORBES Bolster, PA 10/15/2024, 5:30 PM

## 2024-12-10 ENCOUNTER — Other Ambulatory Visit (HOSPITAL_COMMUNITY): Payer: Self-pay | Admitting: Physician Assistant

## 2024-12-10 DIAGNOSIS — F3178 Bipolar disorder, in full remission, most recent episode mixed: Secondary | ICD-10-CM

## 2024-12-17 ENCOUNTER — Telehealth (HOSPITAL_COMMUNITY): Admitting: Physician Assistant
